# Patient Record
Sex: Female | Born: 1995 | Race: White | Hispanic: No | Marital: Married | Smoking: Current every day smoker
Health system: Southern US, Community
[De-identification: ages and names within clinical notes are randomized; demographics above are authoritative.]

## PROBLEM LIST (undated history)

## (undated) DIAGNOSIS — Q796 Ehlers-Danlos syndrome, unspecified: Secondary | ICD-10-CM

## (undated) DIAGNOSIS — I498 Other specified cardiac arrhythmias: Secondary | ICD-10-CM

## (undated) DIAGNOSIS — Z1589 Genetic susceptibility to other disease: Secondary | ICD-10-CM

## (undated) DIAGNOSIS — D649 Anemia, unspecified: Secondary | ICD-10-CM

## (undated) DIAGNOSIS — K219 Gastro-esophageal reflux disease without esophagitis: Secondary | ICD-10-CM

## (undated) DIAGNOSIS — A749 Chlamydial infection, unspecified: Secondary | ICD-10-CM

## (undated) DIAGNOSIS — G43109 Migraine with aura, not intractable, without status migrainosus: Secondary | ICD-10-CM

## (undated) DIAGNOSIS — F419 Anxiety disorder, unspecified: Secondary | ICD-10-CM

## (undated) DIAGNOSIS — F32A Depression, unspecified: Secondary | ICD-10-CM

## (undated) DIAGNOSIS — A080 Rotaviral enteritis: Secondary | ICD-10-CM

## (undated) DIAGNOSIS — R000 Tachycardia, unspecified: Secondary | ICD-10-CM

## (undated) DIAGNOSIS — G90A Postural orthostatic tachycardia syndrome (POTS): Secondary | ICD-10-CM

## (undated) DIAGNOSIS — M069 Rheumatoid arthritis, unspecified: Secondary | ICD-10-CM

## (undated) DIAGNOSIS — R55 Syncope and collapse: Secondary | ICD-10-CM

## (undated) DIAGNOSIS — N83209 Unspecified ovarian cyst, unspecified side: Secondary | ICD-10-CM

## (undated) DIAGNOSIS — K649 Unspecified hemorrhoids: Secondary | ICD-10-CM

## (undated) DIAGNOSIS — I951 Orthostatic hypotension: Secondary | ICD-10-CM

## (undated) DIAGNOSIS — Z8619 Personal history of other infectious and parasitic diseases: Secondary | ICD-10-CM

## (undated) DIAGNOSIS — F410 Panic disorder [episodic paroxysmal anxiety] without agoraphobia: Secondary | ICD-10-CM

## (undated) HISTORY — DX: Migraine with aura, not intractable, without status migrainosus: G43.109

## (undated) HISTORY — DX: Unspecified hemorrhoids: K64.9

## (undated) HISTORY — DX: Panic disorder (episodic paroxysmal anxiety): F41.0

## (undated) HISTORY — DX: Genetic susceptibility to other disease: Z15.89

## (undated) HISTORY — DX: Rheumatoid arthritis, unspecified: M06.9

## (undated) HISTORY — PX: OTHER SURGICAL HISTORY: SHX169

## (undated) HISTORY — DX: Unspecified ovarian cyst, unspecified side: N83.209

## (undated) HISTORY — DX: Anxiety disorder, unspecified: F41.9

## (undated) HISTORY — DX: Chlamydial infection, unspecified: A74.9

## (undated) HISTORY — DX: Personal history of other infectious and parasitic diseases: Z86.19

## (undated) HISTORY — DX: Syncope and collapse: R55

## (undated) HISTORY — DX: Ehlers-Danlos syndrome, unspecified: Q79.60

## (undated) HISTORY — DX: Rotaviral enteritis: A08.0

---
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
## 2015-01-14 ENCOUNTER — Other Ambulatory Visit: Payer: Self-pay

## 2015-01-14 ENCOUNTER — Encounter: Payer: Self-pay | Admitting: Cardiovascular Disease

## 2015-01-14 ENCOUNTER — Ambulatory Visit (INDEPENDENT_AMBULATORY_CARE_PROVIDER_SITE_OTHER): Payer: 59 | Admitting: Cardiovascular Disease

## 2015-01-14 VITALS — BP 98/50 | HR 98 | Ht 66.0 in | Wt 111.8 lb

## 2015-01-14 DIAGNOSIS — I471 Supraventricular tachycardia, unspecified: Secondary | ICD-10-CM

## 2015-01-14 DIAGNOSIS — R000 Tachycardia, unspecified: Secondary | ICD-10-CM | POA: Diagnosis not present

## 2015-01-14 HISTORY — DX: Supraventricular tachycardia, unspecified: I47.10

## 2015-01-14 MED ORDER — PROPRANOLOL HCL 10 MG PO TABS: 10.0000 mg | ORAL_TABLET | Freq: Four times a day (QID) | ORAL | Status: DC | PRN

## 2015-01-14 NOTE — Patient Instructions (Addendum)
Increase your intake of fluids (water with electrolyte tabs like Nun tablets, or gatorade) , protein ( hard boiled eggs, chicken, fish) , and a electrolytes ( V-8 juice, salt, potassium chloride  which is sold as No-Salt)   Try these the next time you have a fast HR episode  1. Valsalva maneuver 2. Stimulation of the diving reflex 3. Carotid sinus massage   If the episode lasts for a > 15 minutes , Try a propranolol 10 mg every 15 minutes ( up to a max of 4)  Medication Instructions:  Your physician has recommended you make the following change in your medication:  START taking propranolol 10mg  every 15 minutes up to maximum of 4 tablets as needed for tachycardia STOP taking Verapamil  Labwork: none  Testing/Procedures: Your physician has recommended that you wear an 30 day event monitor. Event monitors are medical devices that record the heart's electrical activity. Doctors most often [place] these monitors to diagnose arrhythmias. Arrhythmias are problems with the speed or rhythm of the heartbeat. The monitor is a small, portable device. You can wear one while you do your normal daily activities. This is usually used to diagnose what is causing palpitations/syncope (passing out).    Follow-Up: Your physician recommends that you schedule a follow-up appointment in: 4-6 weeks with Dr. Elease Hashimoto   Any Other Special Instructions Will Be Listed Below (If Applicable).  Cardiac Event Monitoring A cardiac event monitor is a small recording device used to help detect abnormal heart rhythms (arrhythmias). The monitor is used to record heart rhythm when noticeable symptoms such as the following occur:  Fast heartbeats (palpitations), such as heart racing or fluttering.  Dizziness.  Fainting or light-headedness.  Unexplained weakness. The monitor is wired to two electrodes placed on your chest. Electrodes are flat, sticky disks that attach to your skin. The monitor can be worn for up to 30 days.  You will wear the monitor at all times, except when bathing.  HOW TO USE YOUR CARDIAC EVENT MONITOR A technician will prepare your chest for the electrode placement. The technician will show you how to place the electrodes, how to work the monitor, and how to replace the batteries. Take time to practice using the monitor before you leave the office. Make sure you understand how to send the information from the monitor to your health care provider. This requires a telephone with a landline, not a cell phone. You need to:  Wear your monitor at all times, except when you are in water:  Do not get the monitor wet.  Take the monitor off when bathing. Do not swim or use a hot tub with it on.  Keep your skin clean. Do not put body lotion or moisturizer on your chest.  Change the electrodes daily or any time they stop sticking to your skin. You might need to use tape to keep them on.  It is possible that your skin under the electrodes could become irritated. To keep this from happening, try to put the electrodes in slightly different places on your chest. However, they must remain in the area under your left breast and in the upper right section of your chest.  Make sure the monitor is safely clipped to your clothing or in a location close to your body that your health care provider recommends.  Press the button to record when you feel symptoms of heart trouble, such as dizziness, weakness, light-headedness, palpitations, thumping, shortness of breath, unexplained weakness, or a fluttering or  racing heart. The monitor is always on and records what happened slightly before you pressed the button, so do not worry about being too late to get good information.  Keep a diary of your activities, such as walking, doing chores, and taking medicine. It is especially important to note what you were doing when you pushed the button to record your symptoms. This will help your health care provider determine what  might be contributing to your symptoms. The information stored in your monitor will be reviewed by your health care provider alongside your diary entries.  Send the recorded information as recommended by your health care provider. It is important to understand that it will take some time for your health care provider to process the results.  Change the batteries as recommended by your health care provider. SEEK IMMEDIATE MEDICAL CARE IF:   You have chest pain.  You have extreme difficulty breathing or shortness of breath.  You develop a very fast heartbeat that persists.  You develop dizziness that does not go away.  You faint or constantly feel you are about to faint. Document Released: 03/21/2008 Document Revised: 10/27/2013 Document Reviewed: 12/09/2012 [facility] Patient Information 2015 [place], [place]. This information is not intended to replace advice given to you by your health care provider. Make sure you discuss any questions you have with your health care provider.

## 2015-01-14 NOTE — Progress Notes (Signed)
Cardiology Office Note   Date:  01/14/2015   ID:  Monique Underwood, DOB 1995/09/27, MRN [id number]  PCP:  Crawford Givens, MD  Cardiologist:   Elease Hashimoto Deloris Ping, MD   Chief Complaint  Patient presents with  . other    C/o rapid heart beat and sob. Discuss verapamil makes pt feels like zombie. Meds reviewed verbally with pt.   Problem List :  1. Palpitations   History of Present Illness: Monique Underwood is a 19 y.o. female who presents for further evaluation of palpitations. She has these with rest or exertion.  Seem to be related with anxiety.  Associated with dyspnea.  Also feels weak and drowsy.  Has not had syncope. Was given verapamil but she does not take it because it makes feel poorly.   Feels like a zombie . Recently converted from depo - birth control and now is on monthly pills.  Does not know if her palps are associated with her periods.    Lasts for several minutes.   Probably less than 10 minutes Has passed out in the past but not necessarily related to a fast HR   Smokes 1/2 - 1 pack a day  She has trouble stopping smoking because of her anxiety.    BP typically runs low  Dietary: Does not eat breakfast Lunch is a snack ( crackers, PB &J, chicken nuggets) Dinner is typically a full meal   Is not working at present.  Is starting at [employer] soon.   Typically works in [employer].    Past Medical History  Diagnosis Date  . Rotavirus infection 1/25 - 07/22/02    Hospitalized  . Migraine with aura   . Syncope   . Anxiety   . Ovarian cyst     Past Surgical History  Procedure Laterality Date  . Febrile seizure  7/99    [facility] - w/u negative  . Mri - head      NML  . Eeg  7/99    NML     Current Outpatient Prescriptions  Medication Sig Dispense Refill  . acyclovir (ZOVIRAX) 400 MG tablet TAKE 1 TABLET (400 MG TOTAL) BY MOUTH 3 (THREE) TIMES DAILY. (Patient taking differently: TAKE 1 TABLET (400 MG TOTAL) BY MOUTH QD PRN.) 43 tablet 2  .  ibuprofen (ADVIL,MOTRIN) 800 MG tablet Take 1 tablet (800 mg total) by mouth 3 (three) times daily. (Patient taking differently: Take 800 mg by mouth daily as needed. ) 21 tablet 0  . norethindrone (ORTHO MICRONOR) 0.35 MG tablet Take 1 tablet (0.35 mg total) by mouth daily. 1 Package 11  . verapamil (CALAN) 40 MG tablet Take 1 tablet (40 mg total) by mouth 3 (three) times daily as needed (for heart racing). 30 tablet 1   No current facility-administered medications for this visit.    Allergies:   Shellfish allergy    Social History:  The patient  reports that she has been smoking Cigarettes.  She has a 1.5 pack-year smoking history. She has never used smokeless tobacco. She reports that she does not drink alcohol or use illicit drugs.   Family History:  The patient's family history includes Cancer in her other; Diabetes in her other; Thyroid disease in her mother.    ROS:  Please see the history of present illness.    Review of Systems: Constitutional:  denies fever, chills, diaphoresis, appetite change and fatigue.  HEENT: denies photophobia, eye pain, redness, hearing loss, ear  pain, congestion, sore throat, rhinorrhea, sneezing, neck pain, neck stiffness and tinnitus.  Respiratory: denies SOB, DOE, cough, chest tightness, and wheezing.  Cardiovascular: denies chest pain, palpitations and leg swelling.  Gastrointestinal: denies nausea, vomiting, abdominal pain, diarrhea, constipation, blood in stool.  Genitourinary: denies dysuria, urgency, frequency, hematuria, flank pain and difficulty urinating.  Musculoskeletal: denies  myalgias, back pain, joint swelling, arthralgias and gait problem.   Skin: denies pallor, rash and wound.  Neurological: denies dizziness, seizures, syncope, weakness, light-headedness, numbness and headaches.   Hematological: denies adenopathy, easy bruising, personal or family bleeding history.  Psychiatric/ Behavioral: denies suicidal ideation, mood changes,  confusion, nervousness, sleep disturbance and agitation.       All other systems are reviewed and negative.    PHYSICAL EXAM: VS:  BP 98/50 mmHg  Pulse 98  Ht 5\' 6"  (1.676 m)  Wt 50.689 kg (111 lb 12 oz)  BMI 18.05 kg/m2  LMP 12/09/2014 , BMI Body mass index is 18.05 kg/(m^2). GEN: Well nourished, well developed, in no acute distress HEENT: normal Neck: no JVD, carotid bruits, or masses Cardiac: RRR; no murmurs, rubs, or gallops,no edema  Respiratory:  clear to auscultation bilaterally, normal work of breathing GI: soft, nontender, nondistended, + BS MS: no deformity or atrophy Skin: warm and dry, no rash Neuro:  Strength and sensation are intact Psych: normal   EKG:  EKG is ordered today. The ekg ordered today demonstrates NSR at 98. Normal     Recent Labs: 12/16/2014: ALT 16; BUN 10; Creatinine, Ser 0.93; Hemoglobin 15.0; Platelets 260.0; Potassium 4.5; Sodium 139; TSH 0.82    Lipid Panel    Component Value Date/Time   CHOL 131 01/17/2013 1534   TRIG 106 01/17/2013 1534   HDL 43 01/17/2013 1534   CHOLHDL 3.0 01/17/2013 1534   VLDL 21 01/17/2013 1534   LDLCALC 67 01/17/2013 1534      Wt Readings from Last 3 Encounters:  01/14/15 50.689 kg (111 lb 12 oz) (20 %*, Z = -0.84)  12/16/14 49.102 kg (108 lb 4 oz) (14 %*, Z = -1.08)  12/10/14 50.689 kg (111 lb 12 oz) (20 %*, Z = -0.83)   * Growth percentiles are based on CDC 2-20 Years data.      Other studies Reviewed: Additional studies/ records that were reviewed today include: . Review of the above records demonstrates:    ASSESSMENT AND PLAN:   1. Palpitations:  I think that her symptoms are consistent with paroxysmal supraventricular tachycardia. She is not tolerating the verapamil well at all. We'll discontinue the verapamil and start her on propranolol 10 mg to take up to 4 times a day as needed for palpitations. Also instructed her in how to do the Valsalva maneuver and told her about stimulation of  the diving reflex. We also discussed carotid sinus massage although I told her that it was fairly difficult to do on yourself.  She's to take a propranolol tablet if the tachycardia does not resolve with a Valsalva maneuver. We'll place a [place] monitor on her for further evaluation. I'll see her back in the office in 4-6 weeks for follow-up visit.  I've recommended that she stay better hydrated. She's to drink plenty of water. I have advised her to increase her protein intake and also in her intake of electrolytes. I've recommended that she drink V8 juice every day. It's clear that she does not eat enough protein or regular food.    Current medicines are reviewed at length  with the patient today.  The patient does not have concerns regarding medicines.  The following changes have been made:  no change  Labs/ tests ordered today include:  Orders Placed This Encounter  Procedures  . EKG 12-Lead     Disposition:   FU with me in 4-6 weeks      Nahser, Deloris Ping, MD  01/14/2015 3:02 PM    [facility] [address] [place], [place], [place]  [address] Phone: [phone]; Fax: [phone]   [facility]  [address] [place], [place]  [address] [phone]   Fax [phone]

## 2015-01-16 ENCOUNTER — Ambulatory Visit (INDEPENDENT_AMBULATORY_CARE_PROVIDER_SITE_OTHER): Payer: 59

## 2015-01-16 DIAGNOSIS — R000 Tachycardia, unspecified: Secondary | ICD-10-CM

## 2015-01-26 ENCOUNTER — Telehealth: Payer: Self-pay

## 2015-01-26 NOTE — Telephone Encounter (Signed)
Pt saw Dr Elease Hashimoto recently and pt was told to contact PCP about anxiety and meds that could be an option. Pt said Dr Elease Hashimoto said he would be fine with any treatment Dr Para March would decide on for anxiety for pt. Pt request cb from Dr Para March. [facility].

## 2015-01-26 NOTE — Telephone Encounter (Signed)
See if she can come in to discuss.  Thanks.

## 2015-01-26 NOTE — Telephone Encounter (Signed)
Patient advised. Appointment scheduled.  

## 2015-01-27 ENCOUNTER — Telehealth: Payer: Self-pay | Admitting: Nurse Practitioner

## 2015-01-27 ENCOUNTER — Ambulatory Visit (INDEPENDENT_AMBULATORY_CARE_PROVIDER_SITE_OTHER): Payer: 59 | Admitting: Family Medicine

## 2015-01-27 ENCOUNTER — Encounter: Payer: Self-pay | Admitting: Family Medicine

## 2015-01-27 VITALS — BP 96/62 | HR 96 | Temp 98.5°F | Wt 113.2 lb

## 2015-01-27 DIAGNOSIS — F411 Generalized anxiety disorder: Secondary | ICD-10-CM

## 2015-01-27 DIAGNOSIS — I471 Supraventricular tachycardia: Secondary | ICD-10-CM

## 2015-01-27 MED ORDER — ESCITALOPRAM OXALATE 10 MG PO TABS
10.0000 mg | ORAL_TABLET | Freq: Every day | ORAL | Status: DC
Start: 2015-01-27 — End: 2015-06-15

## 2015-01-27 MED ORDER — ESCITALOPRAM OXALATE 10 MG PO TABS: 10.0000 mg | ORAL_TABLET | Freq: Every day | ORAL | Status: DC

## 2015-01-27 NOTE — Assessment & Plan Note (Signed)
Per cards.  App help.  She'll use prn BB if maneuvers don't help.

## 2015-01-27 NOTE — Progress Notes (Signed)
Pre visit review using our clinic review tool, if applicable. No additional management support is needed unless otherwise documented below in the visit note.  On monitor currently.  Possible SVT, using PRN BB.  She can feel some episodes of heart racing but not always.  No syncope recently.  She is working to get enough water and protein in with her diet.  She had an episode noted earlier today- noted on the monitor but she didn't feel sx.  She took a dose of BB and tolerated it- she was called by the RN monitoring her case.    She has noted anxiety.  Cards sx likely worse from anxiety, anxiety likely makes the SVT worse.  D/w pt.  She is now out of work, staying with her boyfriend and this has been relaxing for her.  D/w pt about options.  She has enough sx to need/want proph treatment.    Meds, vitals, and allergies reviewed.   ROS: See HPI.  Otherwise, noncontributory.  GEN: nad, alert and oriented HEENT: mucous membranes moist NECK: supple w/o LA CV: rrr.  Monitor in place PULM: ctab, no inc wob ABD: soft, +bs EXT: no edema SKIN: no acute rash Speech and affect wnl, judgement appears wnl

## 2015-01-27 NOTE — Telephone Encounter (Signed)
Called patient per Dr. Harvie Bridge request after receiving monitor report showing SVT.  She reports she did not feel this episode and denies complaints.  I reviewed with her the instructions given at her 7/21 office visit including the valsalva maneuvers and to take Propranolol 10 mg up to 4 times per day as needed.  I advised her to call the office if she has questions or concerns or feels that she needs to be seen sooner than 9/28 appointment.  She verbalized understanding and agreement.

## 2015-01-27 NOTE — Assessment & Plan Note (Signed)
Would try SSRI in her case, d/w pt about options, timeline, etc.  She agrees. Start lexapro 10mg  a day.  She'll update me.

## 2015-01-27 NOTE — Patient Instructions (Addendum)
Start taking the lexapro and update me as needed.  That should help.  Update me in about 2 weeks.  Take care.  Glad to see you.

## 2015-02-17 ENCOUNTER — Other Ambulatory Visit: Payer: Self-pay | Admitting: Cardiovascular Disease

## 2015-03-24 ENCOUNTER — Ambulatory Visit: Payer: 59 | Admitting: Cardiovascular Disease

## 2015-04-01 ENCOUNTER — Ambulatory Visit (INDEPENDENT_AMBULATORY_CARE_PROVIDER_SITE_OTHER): Payer: 59 | Admitting: Internal Medicine

## 2015-04-01 ENCOUNTER — Encounter (INDEPENDENT_AMBULATORY_CARE_PROVIDER_SITE_OTHER): Payer: Self-pay

## 2015-04-01 ENCOUNTER — Encounter: Payer: Self-pay | Admitting: Internal Medicine

## 2015-04-01 VITALS — BP 94/62 | HR 85 | Ht 67.0 in | Wt 115.8 lb

## 2015-04-01 DIAGNOSIS — I471 Supraventricular tachycardia: Secondary | ICD-10-CM

## 2015-04-01 DIAGNOSIS — I7789 Other specified disorders of arteries and arterioles: Secondary | ICD-10-CM | POA: Diagnosis not present

## 2015-04-01 DIAGNOSIS — I341 Nonrheumatic mitral (valve) prolapse: Secondary | ICD-10-CM | POA: Diagnosis not present

## 2015-04-01 NOTE — Progress Notes (Signed)
ELECTROPHYSIOLOGY CONSULT NOTE  Patient ID: Monique Underwood, MRN: [id number], DOB/AGE: 19/16/1997 19 y.o. 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     HPI Monique Underwood is a 19 y.o. female referred for  ECG was obtained initially after she presented to her primary care physician with a relatively low blood pressure. She has a history of exertional palpitations. Event recorder was obtained and demonstrated sinus rhythm. She has no history of abrupt onset or offset tachypalpitations.  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 She has not had problems with shower intolerance. Her menses have been controlled by birth control. Her diet is replete of sodium but deplete of fluid.   She has violaceous discoloration of her lower extremities upon prolonged standing.  Her brother also has arachnodactyly with a pectus deformity. There is no family history of sudden death.      Past Medical History  Diagnosis Date  . Rotavirus infection 1/25 - 07/22/02    Hospitalized  . Migraine with aura   . Syncope   . Anxiety   . Ovarian cyst       Surgical History:  Past Surgical History  Procedure Laterality Date  . Febrile seizure  7/99    [facility] - w/u negative  . Mri - head      NML  . Eeg  7/99    NML     Home Meds: Prior to Admission medications   Medication Sig Start Date End Date Taking? Authorizing Provider  acyclovir (ZOVIRAX) 400 MG tablet TAKE 1 TABLET (400 MG TOTAL) BY MOUTH 3 (THREE) TIMES DAILY. Patient taking differently: TAKE 1 TABLET (400 MG TOTAL) BY MOUTH QD PRN. 11/02/14  Yes Dianne Dun, MD  escitalopram (LEXAPRO) 10 MG tablet Take 1 tablet (10 mg total) by mouth daily. 01/27/15  Yes Joaquim Nam, MD  ibuprofen (ADVIL,MOTRIN) 800 MG tablet Take 1 tablet (800 mg total) by mouth 3 (three) times  daily. Patient taking differently: Take 800 mg by mouth daily as needed.  09/01/13  Yes Shari Upstill, PA-C  norethindrone (ORTHO MICRONOR) 0.35 MG tablet Take 1 tablet (0.35 mg total) by mouth daily. 12/10/14  Yes Joaquim Nam, MD  propranolol (INDERAL) 10 MG tablet TAKE 1 TABLET BY MOUTH EVERY 15 MINUTES UP TO 4 DOSES A DAY AS NEEEDED FOR PALPITATIONS 02/17/15  Yes Vesta Mixer, MD    Allergies:  Allergies  Allergen Reactions  . Shellfish Allergy Anaphylaxis and Hives    Social History   Social History  . Marital Status: Single    Spouse Name: N/A  . Number of Children: N/A  . Years of Education: N/A   Occupational History  . Not on file.   Social History Main Topics  . Smoking status: Current Every Day Smoker -- 0.50 packs/day for 3 years    Types: Cigarettes  . Smokeless tobacco: Never Used     Comment: Parents doesn't know she smokes!!!!!  . Alcohol Use: No  . Drug Use: No  . Sexual Activity: Not on file   Other Topics Concern  . Not on file   Social History Narrative   Lives at home with Mom, Dad and brother.     Family History  Problem Relation Age of Onset  . Thyroid disease Mother   . Diabetes Other   .  Cancer Other      ROS:  Please see the history of present illness.     All other systems reviewed and negative.    Physical Exam: Blood pressure 94/62, pulse 85, height 5\' 7"  (1.702 m), weight 115 lb 12 oz (52.504 kg). General: Well developed, well nourished female in no acute distress. Head: Normocephalic, atraumatic, sclera non-icteric, no xanthomas, nares are without discharge. EENT: normal  Lymph Nodes:  none Neck: Negative for carotid bruits. JVD not elevated. Back:without scoliosis kyphosi  Lungs: Clear bilaterally to auscultation without wheezes, rales, or rhonchi. Breathing is unlabored. Heart: RRR with S1 S2. No murmur . No rubs, or gallops appreciated. Abdomen: Soft, non-tender, non-distended with normoactive bowel sounds. No  hepatomegaly. No rebound/guarding. No obvious abdominal masses. Msk:  Strength and tone appear normal for age. Extremities: No clubbing or cyanosis. No  edema.  Distal pedal pulses are 2+ and equal bilaterally. Joint laxity positive thumb sign positive wrist sign arms and less than height Skin: Warm and Dry; violaceous discoloration of her feet  Neuro: Alert and oriented X 3. CN III-XII intact Grossly normal sensory and motor function . Psych:  Responds to questions appropriately with a normal affect.      Labs: Cardiac Enzymes No results for input(s): CKTOTAL, CKMB, TROPONINI in the last 72 hours. CBC Lab Results  Component Value Date   WBC 9.2 12/16/2014   HGB 15.0 12/16/2014   HCT 44.2 12/16/2014   MCV 90.9 12/16/2014   PLT 260.0 12/16/2014   PROTIME: No results for input(s): LABPROT, INR in the last 72 hours. Chemistry No results for input(s): NA, K, CL, CO2, BUN, CREATININE, CALCIUM, PROT, BILITOT, ALKPHOS, ALT, AST, GLUCOSE in the last 168 hours.  Invalid input(s): LABALBU Lipids Lab Results  Component Value Date   CHOL 131 01/17/2013   HDL 43 01/17/2013   LDLCALC 67 01/17/2013   TRIG 106 01/17/2013   BNP No results found for: PROBNP Thyroid Function Tests: No results for input(s): TSH, T4TOTAL, T3FREE, THYROIDAB in the last 72 hours.  Invalid input(s): FREET3 Miscellaneous No results found for: DDIMER  Radiology/Studies:  No results found.  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  We discussed extensively the issues of dysautonomia, the physiology of orthstasis and positional stress.  We discussed the role of salt and water repletion, the importance of exercise, often needing to be started in the recumbent  position, and the awareness of triggers and the role of ambient heat and dehydration.  I've also told her is important to avoid alcohol and marijuana  It is appropriate to undertake an echo to look for mitral valve prolapse which I do not hear and to look at the base of the aorta.   it is appropriate I think also to undertake imaging to exclude Marfan syndrome With the family history of a pectus deformity, undertaking a CAT scan also to look for the aortic root as well as dural ectasia seems reasonable. There is no history of lens dislocation.        Sherryl Manges

## 2015-04-01 NOTE — Patient Instructions (Addendum)
Medication Instructions:  Your physician recommends that you continue on your current medications as directed. Please refer to the Current Medication list given to you today.   Labwork: none  Testing/Procedures: Your physician has requested that you have an echocardiogram. Echocardiography is a painless test that uses sound waves to create images of your heart. It provides your doctor with information about the size and shape of your heart and how well your heart's chambers and valves are working. This procedure takes approximately one hour. There are no restrictions for this procedure.   Non-Cardiac CT Angiography (CTA), is a special type of CT scan that uses a computer to produce multi-dimensional views of major blood vessels throughout the body. In CT angiography, a contrast material is injected through an IV to help visualize the blood vessels  CT Spine and CT Angio Chest October 13, 1:00pm. Arrival time: 12:30pm    [facility] Liquids only 4 hours prior to exam. May take all medications as prescribed.   Follow-Up: Your physician recommends that you schedule a follow-up appointment in: three months with Dr Graciela Husbands   Any Other Special Instructions Will Be Listed Below (If Applicable).  Echocardiogram An echocardiogram, or echocardiography, uses sound waves (ultrasound) to produce an image of your heart. The echocardiogram is simple, painless, obtained within a short period of time, and offers valuable information to your health care provider. The images from an echocardiogram can provide information such as:  Evidence of coronary artery disease (CAD).  Heart size.  Heart muscle function.  Heart valve function.  Aneurysm detection.  Evidence of a past heart attack.  Fluid buildup around the heart.  Heart muscle thickening.  Assess heart valve function. LET [facility] CARE PROVIDER KNOW ABOUT:  Any allergies you have.  All medicines you are taking, including  vitamins, herbs, eye drops, creams, and over-the-counter medicines.  Previous problems you or members of your family have had with the use of anesthetics.  Any blood disorders you have.  Previous surgeries you have had.  Medical conditions you have.  Possibility of pregnancy, if this applies. BEFORE THE PROCEDURE  No special preparation is needed. Eat and drink normally.  PROCEDURE   In order to produce an image of your heart, gel will be applied to your chest and a wand-like tool (transducer) will be moved over your chest. The gel will help transmit the sound waves from the transducer. The sound waves will harmlessly bounce off your heart to allow the heart images to be captured in real-time motion. These images will then be recorded.  You may need an IV to receive a medicine that improves the quality of the pictures. AFTER THE PROCEDURE You may return to your normal schedule including diet, activities, and medicines, unless your health care provider tells you otherwise.   This information is not intended to replace advice given to you by your health care provider. Make sure you discuss any questions you have with your health care provider.   Document Released: 06/09/2000 Document Revised: 07/03/2014 Document Reviewed: 02/17/2013 Elsevier Interactive Patient Education [employer]

## 2015-04-06 ENCOUNTER — Telehealth: Payer: Self-pay | Admitting: Internal Medicine

## 2015-04-06 MED ORDER — PREDNISONE 50 MG PO TABS
ORAL_TABLET | ORAL | Status: DC
Start: 2015-04-06 — End: 2015-06-15

## 2015-04-06 NOTE — Telephone Encounter (Signed)
The patient is aware of her pre-medication instructions and verbalizes understanding. She will have her mother come with her to the CT on Thursday. RX for prednisone to [facility] [place].  As discussed with the patient: 1) Thursday 10/13 @ 12 am- Prednisone 50 mg x 1 dose 2) Thursday 10/13 @ 6 am- Prednisone 50 mg x 1 dose 3) Thursday 10/13 @ 12 pm- Prednisone 50 mg x 1 dose & Benadryl 50 mg x 1 dose  CT at 1:00 pm on 04/08/15

## 2015-04-06 NOTE — Telephone Encounter (Signed)
Ok per Dr. Graciela Husbands to pre-medicate with orders below.  I left a message for the patient to call me in [place] this afternoon, or if calling tomorrow, to please call the [place] office.

## 2015-04-06 NOTE — Telephone Encounter (Signed)
Call received from [place] in the CT dept at [facility]. She states that patient called today concerned with the contrast she is to receive with her scheduled CT scan on 04/08/15 (CTA chest). 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 The patient will need pre-medication for this if Dr. Graciela Husbands wishes for her to still receive contrast with the scan.  Per Lillia Abed, the pre-med protocol for their department is: Prednisone 50 mg x 1 dose at 13 hour/ 7 hours/ & 1 hour prior to her scan Benadryl 50 mg x 1 dose at 1 hour prior to her scan.  I advised I will review with Dr. Graciela Husbands and contact the patient.

## 2015-04-08 ENCOUNTER — Telehealth: Payer: Self-pay | Admitting: Internal Medicine

## 2015-04-08 ENCOUNTER — Other Ambulatory Visit: Payer: Self-pay | Admitting: Internal Medicine

## 2015-04-08 ENCOUNTER — Ambulatory Visit
Admission: RE | Admit: 2015-04-08 | Discharge: 2015-04-08 | Disposition: A | Discharge status: Home / Self Care | Payer: 59 | Source: Ambulatory Visit | Attending: Internal Medicine | Admitting: Internal Medicine

## 2015-04-08 ENCOUNTER — Other Ambulatory Visit: Payer: Self-pay

## 2015-04-08 ENCOUNTER — Encounter: Payer: Self-pay | Admitting: Family Medicine

## 2015-04-08 DIAGNOSIS — I7789 Other specified disorders of arteries and arterioles: Secondary | ICD-10-CM

## 2015-04-08 DIAGNOSIS — I471 Supraventricular tachycardia: Secondary | ICD-10-CM | POA: Insufficient documentation

## 2015-04-08 DIAGNOSIS — M5126 Other intervertebral disc displacement, lumbar region: Secondary | ICD-10-CM | POA: Diagnosis not present

## 2015-04-08 DIAGNOSIS — I341 Nonrheumatic mitral (valve) prolapse: Secondary | ICD-10-CM

## 2015-04-08 DIAGNOSIS — T508X5A Adverse effect of diagnostic agents, initial encounter: Secondary | ICD-10-CM

## 2015-04-08 LAB — HCG, QUANTITATIVE, PREGNANCY: hCG, Beta Chain, Quant, S: <1 m[IU]/mL (ref <5)

## 2015-04-08 MED ORDER — IOHEXOL 350 MG/ML SOLN
75.0000 mL | Freq: Once | INTRAVENOUS | Status: AC | PRN
  Administered 2015-04-08: 75 mL via INTRAVENOUS

## 2015-04-08 NOTE — Telephone Encounter (Signed)
I did not see this phone call until now as I have been in clinic. Ben called back and did not ask about pre-med orders, but needs a physical signature on the order for the patient's CT scans to be done this afternoon. I advised him it would be just a few minutes (MD in a patient room) and I will send the order. Fax #- [phone].

## 2015-04-08 NOTE — Telephone Encounter (Signed)
New message      Pt is coming in for CT today.  She is allergic to the dye.  Please call and let him know what needs to be done prior to CT.

## 2015-04-23 ENCOUNTER — Ambulatory Visit (INDEPENDENT_AMBULATORY_CARE_PROVIDER_SITE_OTHER): Payer: 59

## 2015-04-23 ENCOUNTER — Other Ambulatory Visit: Payer: Self-pay

## 2015-04-23 DIAGNOSIS — I341 Nonrheumatic mitral (valve) prolapse: Secondary | ICD-10-CM

## 2015-05-19 ENCOUNTER — Ambulatory Visit: Payer: 59 | Admitting: Cardiovascular Disease

## 2015-06-15 ENCOUNTER — Ambulatory Visit (INDEPENDENT_AMBULATORY_CARE_PROVIDER_SITE_OTHER): Payer: 59 | Admitting: Family Medicine

## 2015-06-15 ENCOUNTER — Encounter: Payer: Self-pay | Admitting: Family Medicine

## 2015-06-15 VITALS — BP 88/50 | HR 68 | Temp 98.6°F | Wt 114.2 lb

## 2015-06-15 DIAGNOSIS — R000 Tachycardia, unspecified: Secondary | ICD-10-CM

## 2015-06-15 DIAGNOSIS — F411 Generalized anxiety disorder: Secondary | ICD-10-CM

## 2015-06-15 MED ORDER — IBUPROFEN 800 MG PO TABS: 800.0000 mg | ORAL_TABLET | Freq: Every day | ORAL | Status: DC | PRN

## 2015-06-15 MED ORDER — VENLAFAXINE HCL 37.5 MG PO TABS: 37.5000 mg | ORAL_TABLET | Freq: Two times a day (BID) | ORAL | Status: DC

## 2015-06-15 MED ORDER — ACYCLOVIR 400 MG PO TABS: ORAL_TABLET | ORAL | Status: DC

## 2015-06-15 NOTE — Progress Notes (Signed)
Pre visit review using our clinic review tool, if applicable. No additional management support is needed unless otherwise documented below in the visit note.  Dysautonomia.  PRN BB use.  She does have some inc in heart rate when waking in the AM but usually doesn't need BB then.  She does vagal maneuvers prn.  D/w pt about dysautonomia dx and tx, with salt and fluids.    Mood discussion.  She tried lexapro for about 1 month.  She had a lot of GI upset with lexapro, persistent.  Stopped med ~02/2015, and GI sx resolved.  Anxiety is present.  She is trying to compensate for home and work stressors.  No SI/HI.  She feels overwhelmed.   She gets "upset over little stuffy, more easily annoyed."  She has had some panic sx, occ.  She'll have daily sx. Some days are worse than others.  She isn't depressed.  "I love my life and my job."    Meds, vitals, and allergies reviewed.   ROS: See HPI.  Otherwise, noncontributory.  nad Tearful but regains composure.   rrr ctab abd soft Speech wnl, judgement wnl

## 2015-06-15 NOTE — Patient Instructions (Signed)
Try taking venlafaxine daily for about 10 days.  If tolerated, then take it twice a day and then update me.  I think that would help the anxiety.  Take care.  Glad to see you.

## 2015-06-16 NOTE — Assessment & Plan Note (Signed)
Failed SSRI, intolerant.  Reasonable to try SNRI, with the possibility of needing BZD added on.  D/w pt. Wouldn't start only BZD due to likely eventual failure and need for daily tx.  D/w pt.  Still okay for outpatient f/u.  She agrees with plan. She'll update me.  Will start venlafaxine 37.5mg  qd and if tolerated then inc to BID dosing.  >25 minutes spent in face to face time with patient, >50% spent in counselling or coordination of care

## 2015-06-16 NOTE — Assessment & Plan Note (Signed)
Continue PRN BB use. She does have some inc in heart rate when waking in the AM but usually doesn't need BB then. She does vagal maneuvers prn. D/w pt about dysautonomia dx and tx, with salt and fluids.   She agrees.

## 2015-07-15 ENCOUNTER — Ambulatory Visit (INDEPENDENT_AMBULATORY_CARE_PROVIDER_SITE_OTHER): Payer: 59 | Admitting: Internal Medicine

## 2015-07-15 ENCOUNTER — Encounter: Payer: Self-pay | Admitting: Internal Medicine

## 2015-07-15 VITALS — BP 103/71 | HR 85 | Ht 67.0 in | Wt 112.0 lb

## 2015-07-15 DIAGNOSIS — R000 Tachycardia, unspecified: Secondary | ICD-10-CM | POA: Diagnosis not present

## 2015-07-15 NOTE — Patient Instructions (Signed)
Medication Instructions: - no changes  Labwork: - none  Procedures/Testing: - none  Follow-Up: - Your physician wants you to follow-up in: 6 months with Dr. Graciela Husbands. You will receive a reminder letter in the mail two months in advance. If you don't receive a letter, please call our office to schedule the follow-up appointment.  Any Additional Special Instructions Will Be Listed Below (If Applicable). - Increase the head of the bed (about 4-6 inches) - Please drink fluids in the the morning - Shower at night - Please wear an abdominal binder    If you need a refill on your cardiac medications before your next appointment, please call your pharmacy.

## 2015-07-15 NOTE — Progress Notes (Signed)
      Patient Care Team: Joaquim Nam, MD as PCP - General (Family Medicine)   HPI  Monique Underwood is a 20 y.o. female Seen in followup for exertional palps consistent with sinus rhythm with other findings consistent with autonomic dysfunction incl hypotension, violaceous discoloration of lower extremites syncope  She was also noted to have joint laxity and arachnodactyly  Echo showed normal LV function and structure CT neg  She's had no interval  syncope. She has had some dizziness. Her symptoms are worse in the morning. She showers in the morning this can aggravate them. She is not exercising. Past Medical History  Diagnosis Date  . Rotavirus infection 1/25 - 07/22/02    Hospitalized  . Migraine with aura   . Syncope   . Anxiety   . Ovarian cyst     Past Surgical History  Procedure Laterality Date  . Febrile seizure  7/99    [facility] - w/u negative  . Mri - head      NML  . Eeg  7/99    NML    Current Outpatient Prescriptions  Medication Sig Dispense Refill  . acyclovir (ZOVIRAX) 400 MG tablet TAKE 1 TABLET (400 MG TOTAL) BY MOUTH QD PRN.    Marland Kitchen ibuprofen (ADVIL,MOTRIN) 800 MG tablet Take 1 tablet (800 mg total) by mouth daily as needed.    . propranolol (INDERAL) 10 MG tablet TAKE 1 TABLET BY MOUTH EVERY 15 MINUTES UP TO 4 DOSES A DAY AS NEEEDED FOR PALPITATIONS 120 tablet 3  . venlafaxine (EFFEXOR) 37.5 MG tablet Take 1 tablet (37.5 mg total) by mouth 2 (two) times daily. 60 tablet 1   No current facility-administered medications for this visit.    Allergies  Allergen Reactions  . Shellfish Allergy Anaphylaxis and Hives  . Lexapro [Escitalopram Oxalate] Other (See Comments)    GI upset.       Review of Systems negative except from HPI and PMH  Physical Exam BP 103/71 mmHg  Pulse 85  Ht  (1.702 m)  Wt 112 lb (50.803 kg)  BMI 17.54 kg/m2 Well developed and well nourished in no acute distress HENT normal E scleral and icterus clear Neck  Supple JVP flat; carotids brisk and full Clear to ausculation  Regular rate and rhythm, no murmurs gallops or rub Soft with active bowel sounds No clubbing cyanosis  Edema Alert and oriented, grossly normal motor and sensory function Skin Warm and Dry  ECG demonstrates sinus rhythm at 85 Intervals 06/01/36  Assessment and  Plan  1 dysautonomia  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   We discussed the value of showering at night instead of the morning, raising the head of the bed 4-6 inches to address her major symptoms in the morning, increased hydration the morning. I mentioned that an abdominal binder may be of benefit at her work place where she is not allowed to sit down. We also discussed the potential contribution of alcohol and marijuana. She does not use the latter.

## 2015-09-21 ENCOUNTER — Telehealth: Payer: Self-pay | Admitting: Internal Medicine

## 2015-09-21 NOTE — Telephone Encounter (Signed)
Pt calling stating she is having some tightness in her chest Since she woke up No symptoms  Just can't take a deep breath. Feels like someone is "squeezing" her lungs. Is at work. Not sure if she needs to leave or not Please advise.  ?

## 2015-09-21 NOTE — Telephone Encounter (Signed)
I called and spoke with the patient. She reports having symptoms of chest tightness every day when she wakes up, but this will typically resolve fairly quickly. She woke up at 7 am this morning with chest tightness/ trouble deep breathing, that still has not resolved. She feels like someone is "squeezing" her lungs.  She states she does not feel tachycardic. She only drinks about 2 bottles of water/ day. She works at [employer] and stands up all day. She is not wearing her abdominal binder.  I advised her she may potentially be somewhat dehydrated with the warmer weather and encouraged her to drink gatorade/ powerade to see if this will help. I also advised I would be in touch with Dr. Graciela Husbands for further recommendations.   Reviewed with Dr. Graciela Husbands- he questions the possibility of acid reflux or ? PE if she is on oral contraceptives.  He recommends OTC PPI for possible reflux and evaluation by PCP or Urgent Care if she is concerned about her symptoms.  He does recommend hydration as well.  I have relayed MD recommendations to the patient. She states she she has had reflux in the past, and this feels different.  She has been off oral contraceptives for about a year. I have advised her to call her PCP for further recommendations and possible evaluation/ Urgent Care. Hydration has also been encouraged. The patient verbalizes understanding.

## 2015-10-22 ENCOUNTER — Telehealth: Payer: Self-pay

## 2015-10-22 NOTE — Telephone Encounter (Signed)
Pt has vaginal discharge and perineal itching; pt has not taken abx. Pt is taking probiotics so she has not taken OTC med for vaginitis. Offered [facility] app[facility] at [facility] but pt said her mother [facility]. Pt will cb if needed. FYI to Dr Para March.

## 2015-10-24 NOTE — Telephone Encounter (Signed)
Noted. Thanks.

## 2015-12-02 ENCOUNTER — Ambulatory Visit (INDEPENDENT_AMBULATORY_CARE_PROVIDER_SITE_OTHER): Payer: 59 | Admitting: Internal Medicine

## 2015-12-02 ENCOUNTER — Encounter: Payer: Self-pay | Admitting: Internal Medicine

## 2015-12-02 VITALS — BP 102/68 | HR 80 | Temp 99.0°F | Wt 113.5 lb

## 2015-12-02 DIAGNOSIS — S161XXA Strain of muscle, fascia and tendon at neck level, initial encounter: Secondary | ICD-10-CM | POA: Diagnosis not present

## 2015-12-02 DIAGNOSIS — S46812A Strain of other muscles, fascia and tendons at shoulder and upper arm level, left arm, initial encounter: Secondary | ICD-10-CM

## 2015-12-02 MED ORDER — IBUPROFEN 600 MG PO TABS: 600.0000 mg | ORAL_TABLET | Freq: Three times a day (TID) | ORAL | Status: DC | PRN

## 2015-12-02 MED ORDER — METHOCARBAMOL 500 MG PO TABS: 500.0000 mg | ORAL_TABLET | Freq: Every evening | ORAL | Status: DC | PRN

## 2015-12-02 NOTE — Progress Notes (Signed)
Pre visit review using our clinic review tool, if applicable. No additional management support is needed unless otherwise documented below in the visit note. 

## 2015-12-02 NOTE — Patient Instructions (Signed)

## 2015-12-02 NOTE — Progress Notes (Signed)
Subjective:    Patient ID: Monique Underwood, female    DOB: March 26, 1996, 20 y.o.   MRN: [id number]  HPI  Pt presents to the clinic today with c/o shoulder and neck pain. This started 5 days ago. She denies any injury to the area but reports she woke up with this pain. She describes the pain as sore and stiff. She intermittently has sharp pain between her shoulder blades, that radiates down her left arm. She denies numbness and tingling in her arm. She has tried Ibuprofen 800 mg and ice with minimal relief.  Review of Systems      Past Medical History  Diagnosis Date  . Rotavirus infection 1/25 - 07/22/02    Hospitalized  . Migraine with aura   . Syncope   . Anxiety   . Ovarian cyst     Current Outpatient Prescriptions  Medication Sig Dispense Refill  . acyclovir (ZOVIRAX) 400 MG tablet TAKE 1 TABLET (400 MG TOTAL) BY MOUTH QD PRN.    Marland Kitchen ibuprofen (ADVIL,MOTRIN) 800 MG tablet Take 1 tablet (800 mg total) by mouth daily as needed.    . propranolol (INDERAL) 10 MG tablet TAKE 1 TABLET BY MOUTH EVERY 15 MINUTES UP TO 4 DOSES A DAY AS NEEEDED FOR PALPITATIONS 120 tablet 3  . venlafaxine (EFFEXOR) 37.5 MG tablet Take 1 tablet (37.5 mg total) by mouth 2 (two) times daily. 60 tablet 1   No current facility-administered medications for this visit.    Allergies  Allergen Reactions  . Shellfish Allergy Anaphylaxis and Hives  . Lexapro [Escitalopram Oxalate] Other (See Comments)    GI upset.     Family History  Problem Relation Age of Onset  . Thyroid disease Mother   . Diabetes Other   . Cancer Other     Social History   Social History  . Marital Status: Single    Spouse Name: N/A  . Number of Children: N/A  . Years of Education: N/A   Occupational History  . Not on file.   Social History Main Topics  . Smoking status: Current Every Day Smoker -- 0.50 packs/day for 3 years    Types: Cigarettes  . Smokeless tobacco: Never Used     Comment: Parents doesn't know she  smokes!!!!!  . Alcohol Use: No  . Drug Use: No  . Sexual Activity: Not on file   Other Topics Concern  . Not on file   Social History Narrative   Lives at home with Mom, Dad and brother.     Constitutional: Denies fever, malaise, fatigue, headache or abrupt weight changes.  Musculoskeletal: Pt reports neck and shoulder pain. Denies decrease in range of motion, difficulty with gait, or joint swelling.  Neurological: Denies dizziness, difficulty with memory, difficulty with speech or problems with balance and coordination.  .  No other specific complaints in a complete review of systems (except as listed in HPI above).  Objective:   Physical Exam   BP 102/68 mmHg  Pulse 80  Temp(Src) 99 F (37.2 C) (Oral)  Wt 113 lb 8 oz (51.483 kg)  LMP 12/02/2015 Wt Readings from Last 3 Encounters:  12/02/15 113 lb 8 oz (51.483 kg) (21 %*, Z = -0.81)  07/15/15 112 lb (50.803 kg) (19 %*, Z = -0.87)  06/15/15 114 lb 4 oz (51.823 kg) (24 %*, Z = -0.72)   * Growth percentiles are based on CDC 2-20 Years data.    General: Appears her stated age, well developed, well  nourished in NAD. Musculoskeletal: Decreased flexion and rotation to the right of the cervical spine. Normal extension and rotation to the left. No bony tenderness noted. Pain with palpation of the bilateral paracervical and trapezius muscles. Normal internal and external rotation of the left shoulder. Negative drop can test.  Neurological: Alert and oriented. Sensation intact to BUE.   BMET    Component Value Date/Time   NA 139 12/16/2014 1907   K 4.5 12/16/2014 1907   CL 108 12/16/2014 1907   CO2 24 12/16/2014 1907   GLUCOSE 79 12/16/2014 1907   BUN 10 12/16/2014 1907   CREATININE 0.93 12/16/2014 1907   CREATININE 0.99 01/17/2013 1534   CALCIUM 10.1 12/16/2014 1907    Lipid Panel     Component Value Date/Time   CHOL 131 01/17/2013 1534   TRIG 106 01/17/2013 1534   HDL 43 01/17/2013 1534   CHOLHDL 3.0 01/17/2013  1534   VLDL 21 01/17/2013 1534   LDLCALC 67 01/17/2013 1534    CBC    Component Value Date/Time   WBC 9.2 12/16/2014 1907   RBC 4.86 12/16/2014 1907   HGB 15.0 12/16/2014 1907   HCT 44.2 12/16/2014 1907   PLT 260.0 12/16/2014 1907   MCV 90.9 12/16/2014 1907   MCHC 34.0 12/16/2014 1907   RDW 13.3 12/16/2014 1907   LYMPHSABS 3.3 12/16/2014 1907   MONOABS 0.7 12/16/2014 1907   EOSABS 0.1 12/16/2014 1907   BASOSABS 0.1 12/16/2014 1907    Hgb A1C No results found for: HGBA1C      Assessment & Plan:   Muscle strain of neck and trapezius:  Continue Ibuprofen 600 mgTID prn, eRx sent eRx for Robaxin 500 mg QHS prn, 0 refills Try heat instead of ice Stretching exercises given  RTC as needed or if symptoms persist or worsen

## 2015-12-22 ENCOUNTER — Ambulatory Visit: Payer: 59 | Admitting: Family Medicine

## 2015-12-22 ENCOUNTER — Ambulatory Visit (INDEPENDENT_AMBULATORY_CARE_PROVIDER_SITE_OTHER): Payer: 59 | Admitting: Obstetrics and Gynecology

## 2015-12-22 ENCOUNTER — Encounter: Payer: Self-pay | Admitting: Obstetrics and Gynecology

## 2015-12-22 ENCOUNTER — Encounter: Payer: Self-pay | Admitting: Family Medicine

## 2015-12-22 ENCOUNTER — Ambulatory Visit (INDEPENDENT_AMBULATORY_CARE_PROVIDER_SITE_OTHER): Payer: 59 | Admitting: Family Medicine

## 2015-12-22 VITALS — BP 102/64 | HR 89 | Ht 67.0 in | Wt 117.9 lb

## 2015-12-22 VITALS — BP 92/60 | HR 84 | Temp 97.9°F | Ht 67.0 in | Wt 114.5 lb

## 2015-12-22 DIAGNOSIS — Z3202 Encounter for pregnancy test, result negative: Secondary | ICD-10-CM | POA: Diagnosis not present

## 2015-12-22 DIAGNOSIS — F526 Dyspareunia not due to a substance or known physiological condition: Secondary | ICD-10-CM | POA: Diagnosis not present

## 2015-12-22 DIAGNOSIS — Z113 Encounter for screening for infections with a predominantly sexual mode of transmission: Secondary | ICD-10-CM

## 2015-12-22 DIAGNOSIS — N898 Other specified noninflammatory disorders of vagina: Secondary | ICD-10-CM | POA: Diagnosis not present

## 2015-12-22 DIAGNOSIS — R000 Tachycardia, unspecified: Secondary | ICD-10-CM

## 2015-12-22 DIAGNOSIS — IMO0001 Reserved for inherently not codable concepts without codable children: Secondary | ICD-10-CM

## 2015-12-22 DIAGNOSIS — Z01419 Encounter for gynecological examination (general) (routine) without abnormal findings: Secondary | ICD-10-CM

## 2015-12-22 DIAGNOSIS — B379 Candidiasis, unspecified: Secondary | ICD-10-CM

## 2015-12-22 DIAGNOSIS — Z309 Encounter for contraceptive management, unspecified: Secondary | ICD-10-CM | POA: Diagnosis not present

## 2015-12-22 DIAGNOSIS — F411 Generalized anxiety disorder: Secondary | ICD-10-CM

## 2015-12-22 DIAGNOSIS — Z8742 Personal history of other diseases of the female genital tract: Secondary | ICD-10-CM | POA: Diagnosis not present

## 2015-12-22 DIAGNOSIS — R1111 Vomiting without nausea: Secondary | ICD-10-CM | POA: Diagnosis not present

## 2015-12-22 LAB — POCT URINE PREGNANCY: PREG TEST UR: NEGATIVE

## 2015-12-22 MED ORDER — HYDROXYZINE HCL 10 MG PO TABS: 5.0000 mg | ORAL_TABLET | Freq: Three times a day (TID) | ORAL | Status: DC | PRN

## 2015-12-22 NOTE — Progress Notes (Signed)
GYNECOLOGY ANNUAL PHYSICAL EXAM PROGRESS NOTE  Subjective:    Monique Underwood is a 20 y.o. G0P0 female who presents for an annual exam. The patient is sexually active (1 partner x 4 years).  The patient wears seatbelts: yes. The patient participates in regular exercise: no. Has the patient ever been transfused or tattooed?: no. The patient reports that there is not domestic violence in her life.    The patient has the following complaints today:  1) Thinks she could be pregnant.  Has had intermittent nausea/vomiting, and breast tenderness.  2) Vaginal discharge x 1 week, foul smelling. Took a probiotic which helped.  Desires STI testing.  3) Notes dyspareunia and feels like boyfriend is "hitting something" during intercourse.  Has h/o ovarian cyst.   Gynecologic History Patient's last menstrual period was 12/02/2015. Menarche age: 45 Contraception: none. Using withdrawal method.  History of STI's: Denies Last Pap: No previous GYN history.     OB History  Gravida Para Term Preterm AB SAB TAB Ectopic Multiple Living  0 0 0 0 0 0 0 0 0 0         Past Medical History  Diagnosis Date  . Rotavirus infection 1/25 - 07/22/02    Hospitalized  . Migraine with aura   . Syncope   . Anxiety   . Ovarian cyst   . EDS (Ehlers-Danlos syndrome)     EDS 3    Past Surgical History  Procedure Laterality Date  . Febrile seizure  7/99    [facility] - w/u negative  . Mri - head      NML  . Eeg  7/99    NML    Family History  Problem Relation Age of Onset  . Thyroid disease Mother   . Diabetes Other   . Cancer Other     Social History   Social History  . Marital Status: Single    Spouse Name: N/A  . Number of Children: N/A  . Years of Education: N/A   Occupational History  . Not on file.   Social History Main Topics  . Smoking status: Current Every Day Smoker -- 0.50 packs/day for 3 years    Types: Cigarettes  . Smokeless tobacco: Never Used     Comment: Parents  doesn't know she smokes!!!!!  . Alcohol Use: No  . Drug Use: No  . Sexual Activity: Yes    Birth Control/ Protection: None   Other Topics Concern  . Not on file   Social History Narrative   Lives at home with Mom, Dad and brother.    Current Outpatient Prescriptions on File Prior to Visit  Medication Sig Dispense Refill  . acyclovir (ZOVIRAX) 400 MG tablet TAKE 1 TABLET (400 MG TOTAL) BY MOUTH QD PRN.    Marland Kitchen propranolol (INDERAL) 10 MG tablet TAKE 1 TABLET BY MOUTH EVERY 15 MINUTES UP TO 4 DOSES A DAY AS NEEEDED FOR PALPITATIONS 120 tablet 3  . venlafaxine (EFFEXOR) 37.5 MG tablet Take 1 tablet (37.5 mg total) by mouth 2 (two) times daily. (Patient not taking: Reported on 12/02/2015) 60 tablet 1   No current facility-administered medications on file prior to visit.    Allergies  Allergen Reactions  . Shellfish Allergy Anaphylaxis and Hives  . Lexapro [Escitalopram Oxalate] Other (See Comments)    GI upset.     Review of Systems Constitutional: negative for chills, fatigue, fevers and sweats Eyes: negative for irritation, redness and visual disturbance Ears, nose,  mouth, throat, and face: negative for hearing loss, nasal congestion, snoring and tinnitus Respiratory: negative for asthma, cough, sputum Cardiovascular: negative for chest pain, dyspnea, exertional chest pressure/discomfort, irregular heart beat, palpitations and syncope Gastrointestinal: Positive for nausea.  Negative for abdominal pain, change in bowel habits, and vomiting Genitourinary: negative for abnormal menstrual periods, genital lesions, sexual problems and vaginal discharge, dysuria and urinary incontinence Integument/breast: Positive for breast tenderness. Negative for breast lump and nipple discharge Hematologic/lymphatic: negative for bleeding and easy bruising Musculoskeletal:negative for back pain and muscle weakness Neurological: negative for dizziness, headaches, vertigo and weakness Endocrine:  negative for diabetic symptoms including polydipsia, polyuria and skin dryness Allergic/Immunologic: negative for hay fever and urticaria      Objective:  Blood pressure 102/64, pulse 89, height 5\' 7"  (1.702 m), weight 117 lb 14.4 oz (53.479 kg), last menstrual period 12/02/2015. Body mass index is 18.46 kg/(m^2).   General Appearance:    Alert, cooperative, no distress, appears stated age  Head:    Normocephalic, without obvious abnormality, atraumatic  Eyes:    PERRL, conjunctiva/corneas clear, EOM's intact, both eyes  Ears:    Normal external ear canals, both ears  Nose:   Nares normal, septum midline, mucosa normal, no drainage or sinus tenderness  Throat:   Lips, mucosa, and tongue normal; teeth and gums normal  Neck:   Supple, symmetrical, trachea midline, no adenopathy; thyroid: no enlargement/tenderness/nodules; no carotid bruit or JVD  Back:     Symmetric, no curvature, ROM normal, no CVA tenderness  Lungs:     Clear to auscultation bilaterally, respirations unlabored  Chest Wall:    No tenderness or deformity   Heart:    Regular rate and rhythm, S1 and S2 normal, no murmur, rub or gallop  Breast Exam:    No tenderness, masses, or nipple abnormality  Abdomen:     Soft, non-tender, bowel sounds active all four quadrants, no masses, no organomegaly.    Genitalia:    Pelvic:external genitalia normal, vagina without lesions or tenderness.  Small amount of thin white discharge present, no odor. Nectovaginal septum  normal. Cervix normal in appearance, no cervical motion tenderness, no adnexal masses palpable but mild tenderness noted in left adnexa. Uterus normal size, shape, mobile, regular contours, nontender.  Rectal:    Normal external sphincter.  No hemorrhoids appreciated. Internal exam not done.   Extremities:   Extremities normal, atraumatic, no cyanosis or edema  Pulses:   2+ and symmetric all extremities  Skin:   Skin color, texture, turgor normal, no rashes or lesions  Lymph  nodes:   Cervical, supraclavicular, and axillary nodes normal  Neurologic:   CNII-XII intact, normal strength, sensation and reflexes throughout   .  Labs:  Lab Results  Component Value Date   WBC 9.2 12/16/2014   HGB 15.0 12/16/2014   HCT 44.2 12/16/2014   MCV 90.9 12/16/2014   PLT 260.0 12/16/2014    Lab Results  Component Value Date   CREATININE 0.93 12/16/2014   BUN 10 12/16/2014   NA 139 12/16/2014   K 4.5 12/16/2014   CL 108 12/16/2014   CO2 24 12/16/2014    Lab Results  Component Value Date   ALT 16 12/16/2014   AST 20 12/16/2014   ALKPHOS 64 12/16/2014   BILITOT 0.8 12/16/2014    Lab Results  Component Value Date   TSH 0.82 12/16/2014     Assessment:   Healthy female exam.   Encounter for pregnancy test Vaginal discharge Dyspareunia H/o  ovarian cyst   Plan:    Blood tests: Basic metabolic panel and CBC with diff. Breast self exam technique reviewed and patient encouraged to perform self-exam monthly. Contraception: none.  Is using withdrawal method.  Declines other methods.  Discussed healthy lifestyle modifications. Pregnancy test, result: negative. Advised that since patient has not yet had missed menses, to monitor for absence of menses and retest at home if necessary.  Up to date with HPV vaccination.  Patient desires STI testing.  Will order. Nuswab collected.  Dyspareunia, mostly noted on left side, prior h/o ovarian cyst on that side. Patient thinks she may be developing another cyst.  Will order pelvic sono to assess.  Follow up in 1 year.    Hildred Laser, MD [facility]

## 2015-12-22 NOTE — Patient Instructions (Signed)
Health Maintenance, Female Adopting a healthy lifestyle and getting preventive care can go a long way to promote health and wellness. Talk with your health care provider about what schedule of regular examinations is right for you. This is a good chance for you to check in with your provider about disease prevention and staying healthy. In between checkups, there are plenty of things you can do on your own. Experts have done a lot of research about which lifestyle changes and preventive measures are most likely to keep you healthy. Ask your health care provider for more information. WEIGHT AND DIET  Eat a healthy diet  Be sure to include plenty of vegetables, fruits, low-fat dairy products, and lean protein.  Do not eat a lot of foods high in solid fats, added sugars, or salt.  Get regular exercise. This is one of the most important things you can do for your health.  Most adults should exercise for at least 150 minutes each week. The exercise should increase your heart rate and make you sweat (moderate-intensity exercise).  Most adults should also do strengthening exercises at least twice a week. This is in addition to the moderate-intensity exercise.  Maintain a healthy weight  Body mass index (BMI) is a measurement that can be used to identify possible weight problems. It estimates body fat based on height and weight. Your health care provider can help determine your BMI and help you achieve or maintain a healthy weight.  For females 20 years of age and older:   A BMI below 18.5 is considered underweight.  A BMI of 18.5 to 24.9 is normal.  A BMI of 25 to 29.9 is considered overweight.  A BMI of 30 and above is considered obese.  Watch levels of cholesterol and blood lipids  You should start having your blood tested for lipids and cholesterol at 20 years of age, then have this test every 5 years.  You may need to have your cholesterol levels checked more often if:  Your lipid  or cholesterol levels are high.  You are older than 20 years of age.  You are at high risk for heart disease.  CANCER SCREENING   Lung Cancer  Lung cancer screening is recommended for adults 55-80 years old who are at high risk for lung cancer because of a history of smoking.  A yearly low-dose CT scan of the lungs is recommended for people who:  Currently smoke.  Have quit within the past 15 years.  Have at least a 30-pack-year history of smoking. A pack year is smoking an average of one pack of cigarettes a day for 1 year.  Yearly screening should continue until it has been 15 years since you quit.  Yearly screening should stop if you develop a health problem that would prevent you from having lung cancer treatment.  Breast Cancer  Practice breast self-awareness. This means understanding how your breasts normally appear and feel.  It also means doing regular breast self-exams. Let your health care provider know about any changes, no matter how small.  If you are in your 20s or 30s, you should have a clinical breast exam (CBE) by a health care provider every 1-3 years as part of a regular health exam.  If you are 40 or older, have a CBE every year. Also consider having a breast X-ray (mammogram) every year.  If you have a family history of breast cancer, talk to your health care provider about genetic screening.  If you   are at high risk for breast cancer, talk to your health care provider about having an MRI and a mammogram every year.  Breast cancer gene (BRCA) assessment is recommended for women who have family members with BRCA-related cancers. BRCA-related cancers include:  Breast.  Ovarian.  Tubal.  Peritoneal cancers.  Results of the assessment will determine the need for genetic counseling and BRCA1 and BRCA2 testing. Cervical Cancer Your health care provider may recommend that you be screened regularly for cancer of the pelvic organs (ovaries, uterus, and  vagina). This screening involves a pelvic examination, including checking for microscopic changes to the surface of your cervix (Pap test). You may be encouraged to have this screening done every 3 years, beginning at age 21.  For women ages 30-65, health care providers may recommend pelvic exams and Pap testing every 3 years, or they may recommend the Pap and pelvic exam, combined with testing for human papilloma virus (HPV), every 5 years. Some types of HPV increase your risk of cervical cancer. Testing for HPV may also be done on women of any age with unclear Pap test results.  Other health care providers may not recommend any screening for nonpregnant women who are considered low risk for pelvic cancer and who do not have symptoms. Ask your health care provider if a screening pelvic exam is right for you.  If you have had past treatment for cervical cancer or a condition that could lead to cancer, you need Pap tests and screening for cancer for at least 20 years after your treatment. If Pap tests have been discontinued, your risk factors (such as having a new sexual partner) need to be reassessed to determine if screening should resume. Some women have medical problems that increase the chance of getting cervical cancer. In these cases, your health care provider may recommend more frequent screening and Pap tests. Colorectal Cancer  This type of cancer can be detected and often prevented.  Routine colorectal cancer screening usually begins at 20 years of age and continues through 20 years of age.  Your health care provider may recommend screening at an earlier age if you have risk factors for colon cancer.  Your health care provider may also recommend using home test kits to check for hidden blood in the stool.  A small camera at the end of a tube can be used to examine your colon directly (sigmoidoscopy or colonoscopy). This is done to check for the earliest forms of colorectal  cancer.  Routine screening usually begins at age 50.  Direct examination of the colon should be repeated every 5-10 years through 20 years of age. However, you may need to be screened more often if early forms of precancerous polyps or small growths are found. Skin Cancer  Check your skin from head to toe regularly.  Tell your health care provider about any new moles or changes in moles, especially if there is a change in a mole's shape or color.  Also tell your health care provider if you have a mole that is larger than the size of a pencil eraser.  Always use sunscreen. Apply sunscreen liberally and repeatedly throughout the day.  Protect yourself by wearing long sleeves, pants, a wide-brimmed hat, and sunglasses whenever you are outside. HEART DISEASE, DIABETES, AND HIGH BLOOD PRESSURE   High blood pressure causes heart disease and increases the risk of stroke. High blood pressure is more likely to develop in:  People who have blood pressure in the high end   of the normal range (130-139/85-89 mm Hg).  People who are overweight or obese.  People who are African American.  If you are 38-23 years of age, have your blood pressure checked every 3-5 years. If you are 61 years of age or older, have your blood pressure checked every year. You should have your blood pressure measured twice--once when you are at a hospital or clinic, and once when you are not at a hospital or clinic. Record the average of the two measurements. To check your blood pressure when you are not at a hospital or clinic, you can use:  An automated blood pressure machine at a pharmacy.  A home blood pressure monitor.  If you are between 45 years and 39 years old, ask your health care provider if you should take aspirin to prevent strokes.  Have regular diabetes screenings. This involves taking a blood sample to check your fasting blood sugar level.  If you are at a normal weight and have a low risk for diabetes,  have this test once every three years after 20 years of age.  If you are overweight and have a high risk for diabetes, consider being tested at a younger age or more often. PREVENTING INFECTION  Hepatitis B  If you have a higher risk for hepatitis B, you should be screened for this virus. You are considered at high risk for hepatitis B if:  You were born in a country where hepatitis B is common. Ask your health care provider which countries are considered high risk.  Your parents were born in a high-risk country, and you have not been immunized against hepatitis B (hepatitis B vaccine).  You have HIV or AIDS.  You use needles to inject street drugs.  You live with someone who has hepatitis B.  You have had sex with someone who has hepatitis B.  You get hemodialysis treatment.  You take certain medicines for conditions, including cancer, organ transplantation, and autoimmune conditions. Hepatitis C  Blood testing is recommended for:  Everyone born from [birth year] through 1965.  Anyone with known risk factors for hepatitis C. Sexually transmitted infections (STIs)  You should be screened for sexually transmitted infections (STIs) including gonorrhea and chlamydia if:  You are sexually active and are younger than 20 years of age.  You are older than 20 years of age and your health care provider tells you that you are at risk for this type of infection.  Your sexual activity has changed since you were last screened and you are at an increased risk for chlamydia or gonorrhea. Ask your health care provider if you are at risk.  If you do not have HIV, but are at risk, it may be recommended that you take a prescription medicine daily to prevent HIV infection. This is called pre-exposure prophylaxis (PrEP). You are considered at risk if:  You are sexually active and do not regularly use condoms or know the HIV status of your partner(s).  You take drugs by injection.  You are sexually  active with a partner who has HIV. Talk with your health care provider about whether you are at high risk of being infected with HIV. If you choose to begin PrEP, you should first be tested for HIV. You should then be tested every 3 months for as long as you are taking PrEP.  PREGNANCY   If you are premenopausal and you may become pregnant, ask your health care provider about preconception counseling.  If you may  become pregnant, take 400 to 800 micrograms (mcg) of folic acid every day.  If you want to prevent pregnancy, talk to your health care provider about birth control (contraception). OSTEOPOROSIS AND MENOPAUSE   Osteoporosis is a disease in which the bones lose minerals and strength with aging. This can result in serious bone fractures. Your risk for osteoporosis can be identified using a bone density scan.  If you are 61 years of age or older, or if you are at risk for osteoporosis and fractures, ask your health care provider if you should be screened.  Ask your health care provider whether you should take a calcium or vitamin D supplement to lower your risk for osteoporosis.  Menopause may have certain physical symptoms and risks.  Hormone replacement therapy may reduce some of these symptoms and risks. Talk to your health care provider about whether hormone replacement therapy is right for you.  HOME CARE INSTRUCTIONS   Schedule regular health, dental, and eye exams.  Stay current with your immunizations.   Do not use any tobacco products including cigarettes, chewing tobacco, or electronic cigarettes.  If you are pregnant, do not drink alcohol.  If you are breastfeeding, limit how much and how often you drink alcohol.  Limit alcohol intake to no more than 1 drink per day for nonpregnant women. One drink equals 12 ounces of beer, 5 ounces of wine, or 1 ounces of hard liquor.  Do not use street drugs.  Do not share needles.  Ask your health care provider for help if  you need support or information about quitting drugs.  Tell your health care provider if you often feel depressed.  Tell your health care provider if you have ever been abused or do not feel safe at home.   This information is not intended to replace advice given to you by your health care provider. Make sure you discuss any questions you have with your health care provider.   Document Released: 12/26/2010 Document Revised: 07/03/2014 Document Reviewed: 05/14/2013 Elsevier Interactive Patient Education [employer].

## 2015-12-22 NOTE — Progress Notes (Signed)
Pre visit review using our clinic review tool, if applicable. No additional management support is needed unless otherwise documented below in the visit note.  Intermittently vomiting.  Had pregnancy test at gyn clinic today that was neg.  Sx ongoing for the last ~2 months.  Randomly will get sick.  She might feel well in the AM, still on regular diet, and then get sweaty and then vomit once, then feels better after vomiting.  No blood in vomit.  No fevers. She had labs done at GYN clinic, but she didn't recall the tests.  No jaundice.    BB prn use.  Not used daily.  She has been doing better with her heart rate.  No syncope.  Still lightheaded in the AM, d/w pt about routine cautions and drinking plenty of water. No BLE edema.   Off venlafaxine.  No SI/HI.  She is able to get by as is with her mood.  She some some days that are better than others.  She has some occasional worries but is able to get through her workday.  She didn't feel well on venlafaxine.  She hasn't had success with PRN BB for anxiety.   She has some panic sx monthly, they usually last about 10-15 minutes.    Meds, vitals, and allergies reviewed.   ROS: Per HPI unless specifically indicated in ROS section   GEN: nad, alert and oriented HEENT: mucous membranes moist NECK: supple w/o LA CV: rrr.  PULM: ctab, no inc wob ABD: soft, +bs, not ttp, no masses felt EXT: no edema SKIN: no acute rash

## 2015-12-22 NOTE — Patient Instructions (Addendum)
I would start taking a multivitamin.   Try the atarax for the panic symptoms.  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   Update me as needed.

## 2015-12-23 DIAGNOSIS — R1110 Vomiting, unspecified: Secondary | ICD-10-CM | POA: Insufficient documentation

## 2015-12-23 LAB — BASIC METABOLIC PANEL
BUN/Creatinine Ratio: 21 (ref 9–23)
BUN: 15 mg/dL (ref 6–20)
CO2: 21 mmol/L (ref 18–29)
CREATININE: 0.72 mg/dL (ref 0.57–1.00)
Calcium: 9.3 mg/dL (ref 8.7–10.2)
Chloride: 102 mmol/L (ref 96–106)
GFR calc Af Amer: 140 mL/min/{1.73_m2} (ref >59)
GFR calc non Af Amer: 122 mL/min/{1.73_m2} (ref >59)
GLUCOSE: 82 mg/dL (ref 65–99)
Potassium: 4.4 mmol/L (ref 3.5–5.2)
SODIUM: 138 mmol/L (ref 134–144)

## 2015-12-23 LAB — SPECIMEN STATUS

## 2015-12-23 NOTE — Assessment & Plan Note (Signed)
D/w pt about contraception- she declined at this point.  Advised to be on MVI for general/prenatal care.

## 2015-12-23 NOTE — Assessment & Plan Note (Signed)
>  25 minutes spent in face to face time with patient, >50% spent in counselling or coordination of care D/w pt.  Reasonable to try prn hydroxyzine at low dose and update me as needed.  She agrees.

## 2015-12-23 NOTE — Assessment & Plan Note (Signed)
Self limited w/o other associated GI complaints.  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  She'll monitor for now.  We only wanted to start one med at a time, see hydroxyzine discussion, we can address if persistent.

## 2015-12-23 NOTE — Assessment & Plan Note (Signed)
Continue prn BB.  D/w pt.  She agrees.  Continue routine fluid intake, etc, d/w pt.

## 2015-12-25 LAB — NUSWAB VAGINITIS PLUS (VG+)
CANDIDA GLABRATA, NAA: NEGATIVE
CHLAMYDIA TRACHOMATIS, NAA: NEGATIVE
Candida albicans, NAA: POSITIVE — AB
NEISSERIA GONORRHOEAE, NAA: NEGATIVE
TRICH VAG BY NAA: NEGATIVE

## 2015-12-31 MED ORDER — FLUCONAZOLE 150 MG PO TABS: 150.0000 mg | ORAL_TABLET | Freq: Once | ORAL | Status: DC

## 2015-12-31 NOTE — Addendum Note (Signed)
Addended by: Jackquline Denmark on: 12/31/2015 04:02 PM   Modules accepted: Orders

## 2016-01-05 ENCOUNTER — Other Ambulatory Visit: Payer: 59

## 2016-01-06 ENCOUNTER — Ambulatory Visit (INDEPENDENT_AMBULATORY_CARE_PROVIDER_SITE_OTHER): Payer: 59

## 2016-01-06 DIAGNOSIS — IMO0001 Reserved for inherently not codable concepts without codable children: Secondary | ICD-10-CM

## 2016-01-06 DIAGNOSIS — Z8742 Personal history of other diseases of the female genital tract: Secondary | ICD-10-CM

## 2016-01-06 DIAGNOSIS — F526 Dyspareunia not due to a substance or known physiological condition: Secondary | ICD-10-CM

## 2016-01-25 ENCOUNTER — Telehealth: Payer: Self-pay

## 2016-01-25 NOTE — Telephone Encounter (Signed)
Called pt informed her of the information below.              Please inform patient of ultrasound results: (normal excpet small simple cyst ~ size of a penny on right ovary, likely due to where she was in her menstrual cycle). Usually does not cause problems and resolves on it's own.

## 2016-02-22 ENCOUNTER — Telehealth: Payer: Self-pay

## 2016-02-22 NOTE — Telephone Encounter (Signed)
Patient advised.

## 2016-02-22 NOTE — Telephone Encounter (Signed)
Pt left /vm; pt has been taking propranolol but recently has positive pregnancy test; pt wants to know if can continue taking propranolol for over active heart. Pt request cb.

## 2016-02-22 NOTE — Telephone Encounter (Signed)
I ran her meds for pregnancy.   Propranolol is category C.   Acyclovir is category B.   Hydroxyzine is contraindicated in early pregnancy.  She shouldn't take that.  I took if off the med list.    I wouldn't take hydroxyzine now.  Acyclovir is likely okay.   I'm going to route this to OB and cards for input re: potentially using a different beta blocker.   I appreciate help of all involved.   Thanks.

## 2016-02-23 ENCOUNTER — Encounter: Payer: Self-pay | Admitting: Obstetrics and Gynecology

## 2016-02-23 ENCOUNTER — Ambulatory Visit (INDEPENDENT_AMBULATORY_CARE_PROVIDER_SITE_OTHER): Payer: 59 | Admitting: Obstetrics and Gynecology

## 2016-02-23 VITALS — BP 106/67 | HR 80 | Ht 67.0 in | Wt 113.7 lb

## 2016-02-23 DIAGNOSIS — N926 Irregular menstruation, unspecified: Secondary | ICD-10-CM | POA: Diagnosis not present

## 2016-02-23 LAB — POCT URINE PREGNANCY: PREG TEST UR: POSITIVE — AB

## 2016-02-23 NOTE — Telephone Encounter (Signed)
See note from cards below re: propranolol.  Unless she gets notification from OB o/w, then I would continue propranolol for now.  Thanks.

## 2016-02-23 NOTE — Progress Notes (Signed)
Subjective:     Patient ID: Monique Underwood, female   DOB: 1995-07-13, 20 y.o.   MRN: [id number]  HPI Reports LMP 01/20/16 with +UPT at home yesterday, denies any symptoms. Happy about unplanned pregnancy EDD 10/26/16, and EGA [redacted]w[redacted]d  Review of Systems See above    Objective:   Physical Exam A&O x4  well groomed female in no distress Blood pressure 106/67, pulse 80, height 5\' 7"  (1.702 m), weight 113 lb 11.2 oz (51.6 kg), last menstrual period 01/20/2016. UPT+    Assessment:     Missed menses    Plan:     RTC in 3 weeks for viability scan, Nurse intake & OB labs. Smoking cessation discussed Concept DHA samples given to start  Harlow Mares, CNM

## 2016-02-23 NOTE — Telephone Encounter (Signed)
Patient notified as instructed by telephone and verbalized understanding. 

## 2016-02-23 NOTE — Patient Instructions (Signed)
First Trimester of Pregnancy The first trimester of pregnancy is from week 1 until the end of week 12 (months 1 through 3). A week after a sperm fertilizes an egg, the egg will implant on the wall of the uterus. This embryo will begin to develop into a baby. Genes from you and your partner are forming the baby. The female genes determine whether the baby is a boy or a girl. At 6-8 weeks, the eyes and face are formed, and the heartbeat can be seen on ultrasound. At the end of 12 weeks, all the baby's organs are formed.  Now that you are pregnant, you will want to do everything you can to have a healthy baby. Two of the most important things are to get good prenatal care and to follow your health care provider's instructions. Prenatal care is all the medical care you receive before the baby's birth. This care will help prevent, find, and treat any problems during the pregnancy and childbirth. BODY CHANGES Your body goes through many changes during pregnancy. The changes vary from woman to woman.   You may gain or lose a couple of pounds at first.  You may feel sick to your stomach (nauseous) and throw up (vomit). If the vomiting is uncontrollable, call your health care provider.  You may tire easily.  You may develop headaches that can be relieved by medicines approved by your health care provider.  You may urinate more often. Painful urination may mean you have a bladder infection.  You may develop heartburn as a result of your pregnancy.  You may develop constipation because certain hormones are causing the muscles that push waste through your intestines to slow down.  You may develop hemorrhoids or swollen, bulging veins (varicose veins).  Your breasts may begin to grow larger and become tender. Your nipples may stick out more, and the tissue that surrounds them (areola) may become darker.  Your gums may bleed and may be sensitive to brushing and flossing.  Dark spots or blotches (chloasma,  mask of pregnancy) may develop on your face. This will likely fade after the baby is born.  Your menstrual periods will stop.  You may have a loss of appetite.  You may develop cravings for certain kinds of food.  You may have changes in your emotions from day to day, such as being excited to be pregnant or being concerned that something may go wrong with the pregnancy and baby.  You may have more vivid and strange dreams.  You may have changes in your hair. These can include thickening of your hair, rapid growth, and changes in texture. Some women also have hair loss during or after pregnancy, or hair that feels dry or thin. Your hair will most likely return to normal after your baby is born. WHAT TO EXPECT AT YOUR PRENATAL VISITS During a routine prenatal visit:  You will be weighed to make sure you and the baby are growing normally.  Your blood pressure will be taken.  Your abdomen will be measured to track your baby's growth.  The fetal heartbeat will be listened to starting around week 10 or 12 of your pregnancy.  Test results from any previous visits will be discussed. Your health care provider may ask you:  How you are feeling.  If you are feeling the baby move.  If you have had any abnormal symptoms, such as leaking fluid, bleeding, severe headaches, or abdominal cramping.  If you are using any tobacco products,   including cigarettes, chewing tobacco, and electronic cigarettes.  If you have any questions. Other tests that may be performed during your first trimester include:  Blood tests to find your blood type and to check for the presence of any previous infections. They will also be used to check for low iron levels (anemia) and Rh antibodies. Later in the pregnancy, blood tests for diabetes will be done along with other tests if problems develop.  Urine tests to check for infections, diabetes, or protein in the urine.  An ultrasound to confirm the proper growth  and development of the baby.  An amniocentesis to check for possible genetic problems.  Fetal screens for spina bifida and Down syndrome.  You may need other tests to make sure you and the baby are doing well.  HIV (human immunodeficiency virus) testing. Routine prenatal testing includes screening for HIV, unless you choose not to have this test. HOME CARE INSTRUCTIONS  Medicines  Follow your health care provider's instructions regarding medicine use. Specific medicines may be either safe or unsafe to take during pregnancy.  Take your prenatal vitamins as directed.  If you develop constipation, try taking a stool softener if your health care provider approves. Diet  Eat regular, well-balanced meals. Choose a variety of foods, such as meat or vegetable-based protein, fish, milk and low-fat dairy products, vegetables, fruits, and whole grain breads and cereals. Your health care provider will help you determine the amount of weight gain that is right for you.  Avoid raw meat and uncooked cheese. These carry germs that can cause birth defects in the baby.  Eating four or five small meals rather than three large meals a day may help relieve nausea and vomiting. If you start to feel nauseous, eating a few soda crackers can be helpful. Drinking liquids between meals instead of during meals also seems to help nausea and vomiting.  If you develop constipation, eat more high-fiber foods, such as fresh vegetables or fruit and whole grains. Drink enough fluids to keep your urine clear or pale yellow. Activity and Exercise  Exercise only as directed by your health care provider. Exercising will help you:  Control your weight.  Stay in shape.  Be prepared for labor and delivery.  Experiencing pain or cramping in the lower abdomen or low back is a good sign that you should stop exercising. Check with your health care provider before continuing normal exercises.  Try to avoid standing for long  periods of time. Move your legs often if you must stand in one place for a long time.  Avoid heavy lifting.  Wear low-heeled shoes, and practice good posture.  You may continue to have sex unless your health care provider directs you otherwise. Relief of Pain or Discomfort  Wear a good support bra for breast tenderness.   Take warm sitz baths to soothe any pain or discomfort caused by hemorrhoids. Use hemorrhoid cream if your health care provider approves.   Rest with your legs elevated if you have leg cramps or low back pain.  If you develop varicose veins in your legs, wear support hose. Elevate your feet for 15 minutes, 3-4 times a day. Limit salt in your diet. Prenatal Care  Schedule your prenatal visits by the twelfth week of pregnancy. They are usually scheduled monthly at first, then more often in the last 2 months before delivery.  Write down your questions. Take them to your prenatal visits.  Keep all your prenatal visits as directed by your   health care provider. Safety  Wear your seat belt at all times when driving.  Make a list of emergency phone numbers, including numbers for family, friends, the hospital, and police and fire departments. General Tips  Ask your health care provider for a referral to a local prenatal education class. Begin classes no later than at the beginning of month 6 of your pregnancy.  Ask for help if you have counseling or nutritional needs during pregnancy. Your health care provider can offer advice or refer you to specialists for help with various needs.  Do not use hot tubs, steam rooms, or saunas.  Do not douche or use tampons or scented sanitary pads.  Do not cross your legs for long periods of time.  Avoid cat litter boxes and soil used by cats. These carry germs that can cause birth defects in the baby and possibly loss of the fetus by miscarriage or stillbirth.  Avoid all smoking, herbs, alcohol, and medicines not prescribed by  your health care provider. Chemicals in these affect the formation and growth of the baby.  Do not use any tobacco products, including cigarettes, chewing tobacco, and electronic cigarettes. If you need help quitting, ask your health care provider. You may receive counseling support and other resources to help you quit.  Schedule a dentist appointment. At home, brush your teeth with a soft toothbrush and be gentle when you floss. SEEK MEDICAL CARE IF:   You have dizziness.  You have mild pelvic cramps, pelvic pressure, or nagging pain in the abdominal area.  You have persistent nausea, vomiting, or diarrhea.  You have a bad smelling vaginal discharge.  You have pain with urination.  You notice increased swelling in your face, hands, legs, or ankles. SEEK IMMEDIATE MEDICAL CARE IF:   You have a fever.  You are leaking fluid from your vagina.  You have spotting or bleeding from your vagina.  You have severe abdominal cramping or pain.  You have rapid weight gain or loss.  You vomit blood or material that looks like coffee grounds.  You are exposed to [place] measles and have never had them.  You are exposed to fifth disease or chickenpox.  You develop a severe headache.  You have shortness of breath.  You have any kind of trauma, such as from a fall or a car accident.   This information is not intended to replace advice given to you by your health care provider. Make sure you discuss any questions you have with your health care provider.   Document Released: 06/06/2001 Document Revised: 07/03/2014 Document Reviewed: 04/22/2013 Elsevier Interactive Patient Education 2016 [employer]  Pregnancy and Smoking Smoking during pregnancy is unhealthy for you and your developing baby. The addictive drug nicotine, carbon monoxide, and many other poisons are inhaled from a cigarette and carried through your bloodstream to your baby. Cigarette smoke contains more than 2,500  chemicals. It is not known which of these are harmful to a developing baby. However, both nicotine and carbon monoxide play a role in causing health problems in pregnancy. Smoking during pregnancy increases the risk of:  Birth defects in your baby, including heart defects.  Miscarriage and stillbirth.  Birth before 37 completed weeks of pregnancy (premature birth).  Pregnancy outside of the uterus (tubal pregnancy).  Attachment of the placenta over the opening of the uterus (placenta previa).  Detachment of the placenta before the baby's birth (placental abruption).  Breaking of the bag of waters before labor begins (premature rupture  of membranes). HOW DOES SMOKING DURING PREGNANCY AFFECT MY BABY? Before Birth Smoking during pregnancy:  Decreases blood flow and oxygen to your baby.  Increases the heart rate of your baby.  Slows your baby's growth in the uterus (intrauterine growth retardation). After Birth Babies born to women who smoke during pregnancy are more likely to have a low birth weight. They are also at risk for:  Serious health problems, chronic or lifelong disabilities (cerebral palsy, mental retardation, learning problems), and death.  Sudden infant death syndrome (SIDS).  Lung and breathing problems. WHAT RESOURCES ARE AVAILABLE TO HELP ME STOP SMOKING?  Ask your health care provider for help to stop smoking. The following resources are available:  Counseling.  Psychological treatment.  Acupuncture.  Family intervention.  Hypnosis.  Nicotine supplements have not been studied enough to know if they are safe to use during pregnancy. They should only be considered when all other methods fail, and if used under the close supervision of your health care provider.  Telephone QUIT lines. The national smoking cessation telephone hotline number is 1-800-QUIT NOW. FOR MORE INFORMATION  American Cancer Society: www.cancer.org  American Heart Association:  www.heart.org  National Cancer Institute: www.cancer.gov  March of Dimes: www.marchofdimes.org   This information is not intended to replace advice given to you by your health care provider. Make sure you discuss any questions you have with your health care provider.   Document Released: 10/24/2004 Document Revised: 06/17/2013 Document Reviewed: 05/12/2013 Elsevier Interactive Patient Education [employer]

## 2016-02-23 NOTE — Telephone Encounter (Signed)
Her symptoms may well improve as she moves nto the second trimester and her propranolol could be stopped about then

## 2016-02-24 NOTE — Telephone Encounter (Signed)
MN'S to review. Consider converting propanolol to metoprolol.

## 2016-02-29 ENCOUNTER — Telehealth: Payer: Self-pay | Admitting: Obstetrics and Gynecology

## 2016-02-29 NOTE — Telephone Encounter (Signed)
PT CALLED AND SHE HAD HER NOB INTAKE LAST WEEK AND SHE HAD HER NEW OB PE ON 9/21 AND SHE IS ABOUT [redacted] WEEKS PREGNANT AND SHE IS WANTING SOMETHING FOR NAUSEA, SHE ALSO HAS A HEART CONDITION CAUSES HER TO HAVE NAUSEA, HEART CONDITION IS  EDS 3.

## 2016-03-01 NOTE — Telephone Encounter (Signed)
Sent pt mychart message

## 2016-03-03 ENCOUNTER — Other Ambulatory Visit: Payer: Self-pay

## 2016-03-03 ENCOUNTER — Telehealth: Payer: Self-pay | Admitting: Obstetrics and Gynecology

## 2016-03-03 DIAGNOSIS — O219 Vomiting of pregnancy, unspecified: Secondary | ICD-10-CM

## 2016-03-03 MED ORDER — DOXYLAMINE-PYRIDOXINE 10-10 MG PO TBEC: 10.0000 mg | DELAYED_RELEASE_TABLET | ORAL | 3 refills | Status: DC | PRN

## 2016-03-03 NOTE — Telephone Encounter (Signed)
Pt is [redacted]wks pregnant she is naused. She is wanting tot [facility] therap[facility] Diclegis. But call her fist and see if that's what she needs. We have coupons for Diclegis and she would need to come get one before she picks up her RX

## 2016-03-03 NOTE — Telephone Encounter (Signed)
Called pt she states that she is experiencing nausea all day long with no relief. Advised pt on small frequent meals, ginger ale, saltine crackers, and coke. Pt gave verbal understanding. Will also send script for Diclegis.

## 2016-03-07 ENCOUNTER — Other Ambulatory Visit: Payer: Self-pay | Admitting: *Deleted

## 2016-03-07 ENCOUNTER — Other Ambulatory Visit: Payer: Self-pay | Admitting: Obstetrics and Gynecology

## 2016-03-07 MED ORDER — METOPROLOL TARTRATE 25 MG PO TABS: 25.0000 mg | ORAL_TABLET | Freq: Two times a day (BID) | ORAL | 6 refills | Status: DC

## 2016-03-07 MED ORDER — ONDANSETRON 4 MG PO TBDP
4.0000 mg | ORAL_TABLET | Freq: Three times a day (TID) | ORAL | 1 refills | Status: DC | PRN
Start: 2016-03-07 — End: 2016-04-20

## 2016-03-07 NOTE — Telephone Encounter (Signed)
Left detailed message for pt 

## 2016-03-07 NOTE — Telephone Encounter (Signed)
Please let her know I switched BP meds- she is to switch now and take one pill two times daily.

## 2016-03-16 ENCOUNTER — Ambulatory Visit (INDEPENDENT_AMBULATORY_CARE_PROVIDER_SITE_OTHER): Payer: 59

## 2016-03-16 DIAGNOSIS — N926 Irregular menstruation, unspecified: Secondary | ICD-10-CM | POA: Diagnosis not present

## 2016-03-17 ENCOUNTER — Ambulatory Visit (INDEPENDENT_AMBULATORY_CARE_PROVIDER_SITE_OTHER): Payer: 59 | Admitting: Obstetrics and Gynecology

## 2016-03-17 VITALS — BP 94/60 | HR 84 | Wt 116.0 lb

## 2016-03-17 DIAGNOSIS — Z113 Encounter for screening for infections with a predominantly sexual mode of transmission: Secondary | ICD-10-CM

## 2016-03-17 DIAGNOSIS — Z369 Encounter for antenatal screening, unspecified: Secondary | ICD-10-CM

## 2016-03-17 DIAGNOSIS — Z36 Encounter for antenatal screening of mother: Secondary | ICD-10-CM

## 2016-03-17 DIAGNOSIS — Z3401 Encounter for supervision of normal first pregnancy, first trimester: Secondary | ICD-10-CM

## 2016-03-17 NOTE — Progress Notes (Signed)
[name] presents for NOB nurse interview visit. G1.  P0. Pregnancy education material explained and given. 1 cat in the home, pt states that FOB has been changing litter box. Toxoplasma ordered. NOB labs ordered. HIV lab screen was explained optional and she could opt out of test but did not decline. Drug screen declined. PNV encouraged. NT to discuss with provider. Pt concerned about her heart condition possibly being congential and how that may effect pregnancy. Advised pt that I would relay concerns to provider. Also offered pt counseling on smoking cessation and offered resources ([employer]). Pt given samples of Diclegis for N/V Pt. To follow up with provider in 4 weeks for NOB physical.  All questions answered.

## 2016-03-17 NOTE — Patient Instructions (Signed)
Pregnancy and Smoking Smoking during pregnancy is unhealthy for you and your developing baby. The addictive drug nicotine, carbon monoxide, and many other poisons are inhaled from a cigarette and carried through your bloodstream to your baby. Cigarette smoke contains more than 2,500 chemicals. It is not known which of these are harmful to a developing baby. However, both nicotine and carbon monoxide play a role in causing health problems in pregnancy. Smoking during pregnancy increases the risk of:  Birth defects in your baby, including heart defects.  Miscarriage and stillbirth.  Birth before 37 completed weeks of pregnancy (premature birth).  Pregnancy outside of the uterus (tubal pregnancy).  Attachment of the placenta over the opening of the uterus (placenta previa).  Detachment of the placenta before the baby's birth (placental abruption).  Breaking of the bag of waters before labor begins (premature rupture of membranes). HOW DOES SMOKING DURING PREGNANCY AFFECT MY BABY? Before Birth Smoking during pregnancy:  Decreases blood flow and oxygen to your baby.  Increases the heart rate of your baby.  Slows your baby's growth in the uterus (intrauterine growth retardation). After Birth Babies born to women who smoke during pregnancy are more likely to have a low birth weight. They are also at risk for:  Serious health problems, chronic or lifelong disabilities (cerebral palsy, mental retardation, learning problems), and death.  Sudden infant death syndrome (SIDS).  Lung and breathing problems. WHAT RESOURCES ARE AVAILABLE TO HELP ME STOP SMOKING?  Ask your health care provider for help to stop smoking. The following resources are available:  Counseling.  Psychological treatment.  Acupuncture.  Family intervention.  Hypnosis.  Nicotine supplements have not been studied enough to know if they are safe to use during pregnancy. They should only be considered when all other  methods fail, and if used under the close supervision of your health care provider.  Telephone QUIT lines. The national smoking cessation telephone hotline number is 1-800-QUIT NOW. FOR MORE INFORMATION  American Cancer Society: www.cancer.org  American Heart Association: www.heart.org  National Cancer Institute: www.cancer.gov  March of Dimes: www.marchofdimes.org   This information is not intended to replace advice given to you by your health care provider. Make sure you discuss any questions you have with your health care provider.   Document Released: 10/24/2004 Document Revised: 06/17/2013 Document Reviewed: 05/12/2013 Elsevier Interactive Patient Education [employer]

## 2016-03-18 LAB — URINALYSIS, ROUTINE W REFLEX MICROSCOPIC
BILIRUBIN UA: NEGATIVE
Glucose, UA: NEGATIVE
Ketones, UA: NEGATIVE
LEUKOCYTES UA: NEGATIVE
Nitrite, UA: NEGATIVE
PH UA: 6 (ref 5.0–7.5)
PROTEIN UA: NEGATIVE
RBC UA: NEGATIVE
Specific Gravity, UA: 1.028 (ref 1.005–1.030)
Urobilinogen, Ur: 0.2 mg/dL (ref 0.2–1.0)

## 2016-03-18 LAB — URINE CULTURE: ORGANISM ID, BACTERIA: NO GROWTH

## 2016-03-20 LAB — CBC WITH DIFFERENTIAL/PLATELET
BASOS: 0 %
Basophils Absolute: 0 10*3/uL (ref 0.0–0.2)
EOS (ABSOLUTE): 0.1 10*3/uL (ref 0.0–0.4)
EOS: 1 %
HEMATOCRIT: 39.4 % (ref 34.0–46.6)
HEMOGLOBIN: 13.7 g/dL (ref 11.1–15.9)
Immature Grans (Abs): 0 10*3/uL (ref 0.0–0.1)
Immature Granulocytes: 0 %
LYMPHS ABS: 2.3 10*3/uL (ref 0.7–3.1)
Lymphs: 26 %
MCH: 31.4 pg (ref 26.6–33.0)
MCHC: 34.8 g/dL (ref 31.5–35.7)
MCV: 90 fL (ref 79–97)
MONOCYTES: 9 %
Monocytes Absolute: 0.7 10*3/uL (ref 0.1–0.9)
NEUTROS ABS: 5.6 10*3/uL (ref 1.4–7.0)
Neutrophils: 64 %
Platelets: 240 10*3/uL (ref 150–379)
RBC: 4.36 x10E6/uL (ref 3.77–5.28)
RDW: 13.3 % (ref 12.3–15.4)
WBC: 8.7 10*3/uL (ref 3.4–10.8)

## 2016-03-20 LAB — VARICELLA ZOSTER ANTIBODY, IGG: Varicella zoster IgG: 839 index (ref >165)

## 2016-03-20 LAB — TOXOPLASMA ANTIBODIES- IGG AND  IGM
Toxoplasma Antibody- IgM: <3 AU/mL (ref 0.0–7.9)
Toxoplasma IgG Ratio: <3 IU/mL (ref 0.0–7.1)

## 2016-03-20 LAB — ANTIBODY SCREEN: Antibody Screen: NEGATIVE

## 2016-03-20 LAB — HEP, RPR, HIV PANEL
HIV SCREEN 4TH GENERATION: NONREACTIVE
Hepatitis B Surface Ag: NEGATIVE
RPR Ser Ql: NONREACTIVE

## 2016-03-20 LAB — RUBELLA SCREEN: RUBELLA: 1.26 {index} (ref >0.99)

## 2016-03-20 LAB — SICKLE CELL SCREEN: Sickle Cell Screen: NEGATIVE

## 2016-03-20 LAB — ABO AND RH: Rh Factor: NEGATIVE

## 2016-04-06 ENCOUNTER — Telehealth: Payer: Self-pay | Admitting: Obstetrics and Gynecology

## 2016-04-06 MED ORDER — CITRANATAL B-CALM 20-1 MG & 2 X 25 MG PO MISC: 20.0000 mg | Freq: Every day | ORAL | 11 refills | Status: DC

## 2016-04-06 NOTE — Telephone Encounter (Signed)
Pt wants to know if she can come get some more samples of prenatals and diclegis. She said the diclegis worked real good.

## 2016-04-09 IMAGING — CT CT ANGIO CHEST
2 of 6 series · 19 of 36 positions shown · IV contrast (omnipaque)
Comparison: None.

CLINICAL DATA: Supraventricular tachycardia. Connective tissue
disorder. Evaluate for dissection or aneurysm.

EXAM:
CT ANGIOGRAPHY CHEST WITH CONTRAST
TECHNIQUE: Multidetector CT imaging of the chest was performed using the
standard protocol during bolus administration of intravenous
contrast. Multiplanar CT image reconstructions and MIPs were
obtained to evaluate the vascular anatomy.
CONTRAST:  75mL OMNIPAQUE IOHEXOL 350 MG/ML SOLN

[Series 6: arterial · axial · arterial · 0.58mm/px · z∈[-320,-24]mm · 18 of 166 slices shown]
[im 9/166  lung]
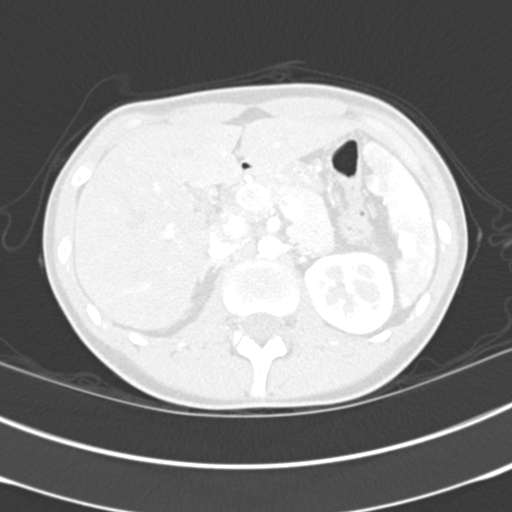
[im 18/166  mediastinal]
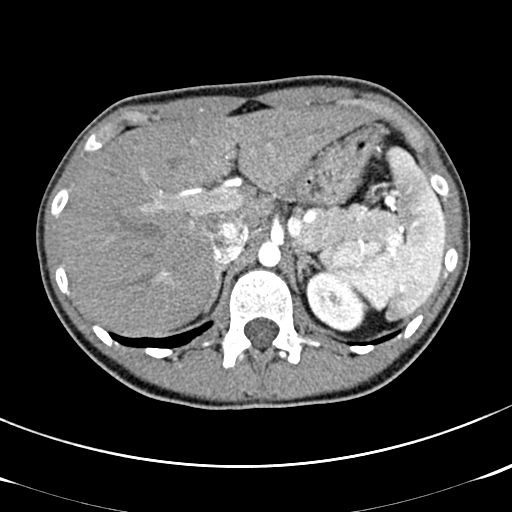
[im 27/166  lung]
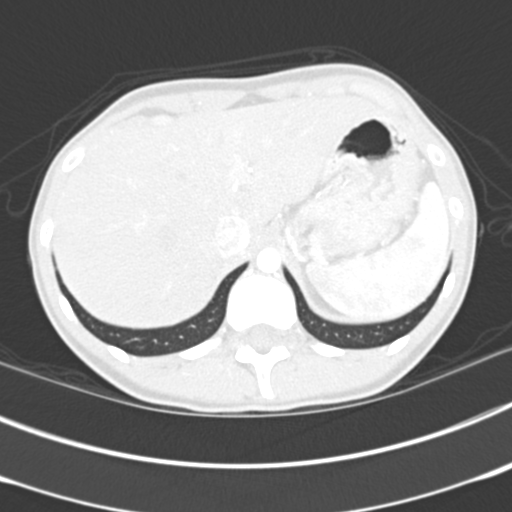
[im 35/166  mediastinal]
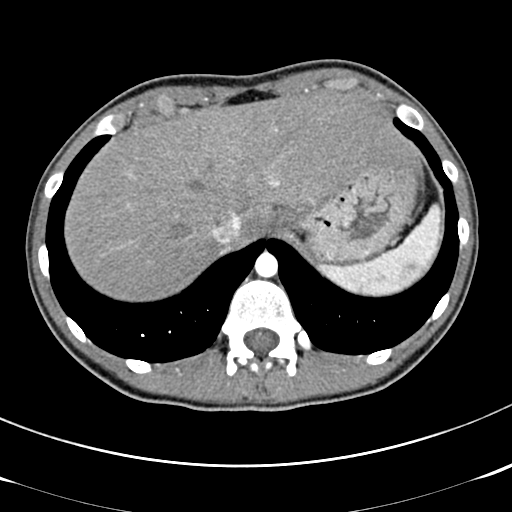
[im 44/166  lung]
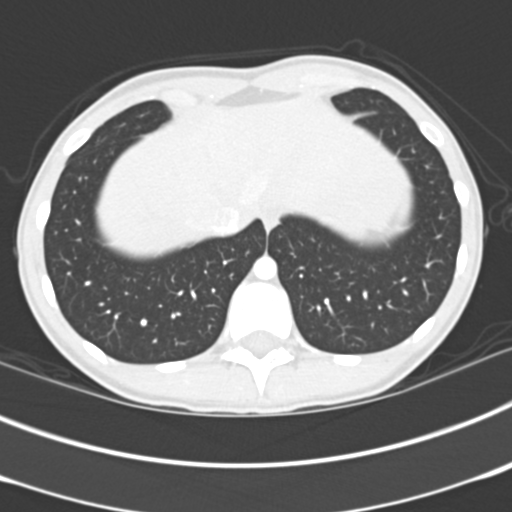
[im 53/166  mediastinal]
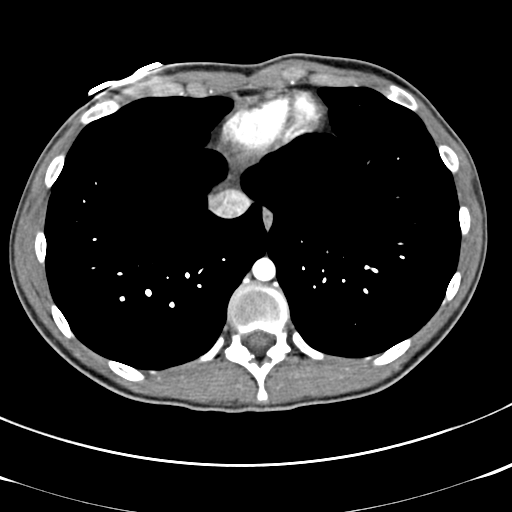
[im 61/166  lung]
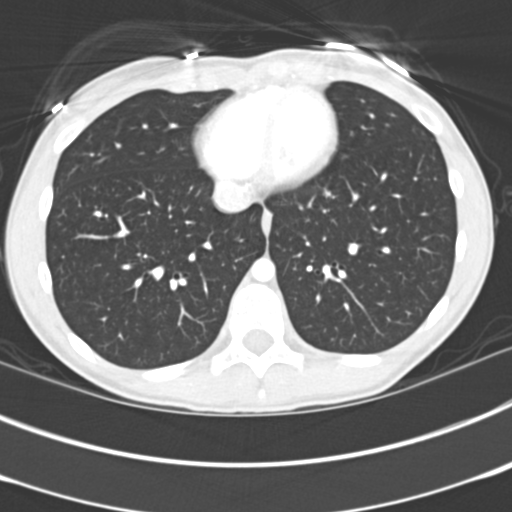
[im 70/166  mediastinal]
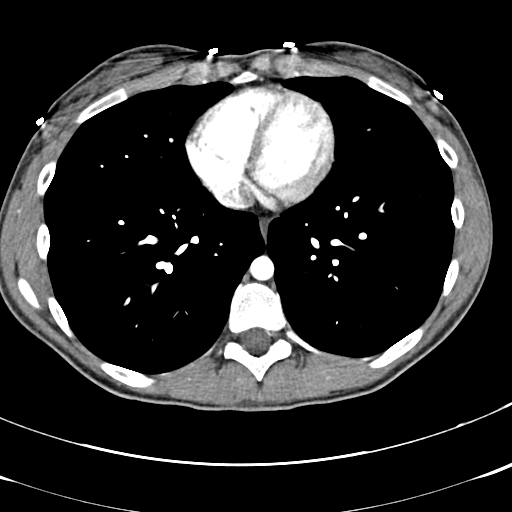
[im 79/166  lung]
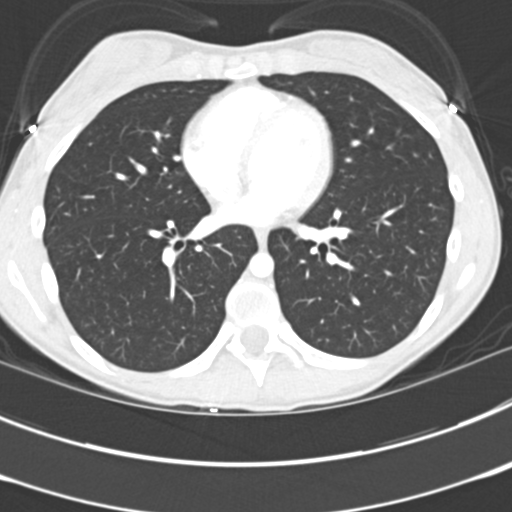
[im 87/166  mediastinal]
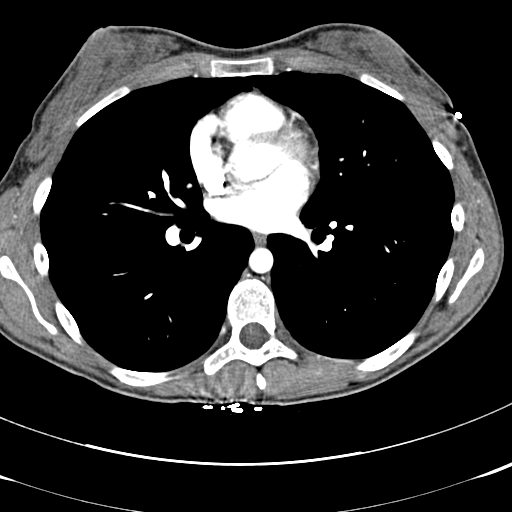
[im 96/166  lung]
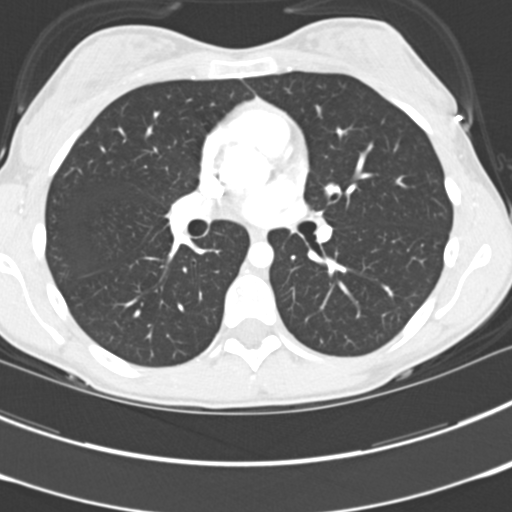
[im 105/166  mediastinal]
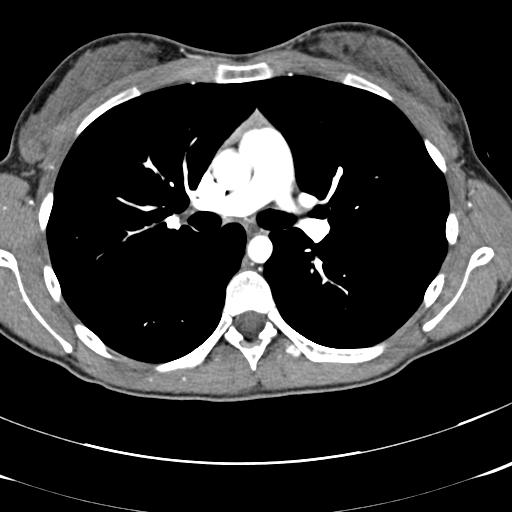
[im 113/166  lung]
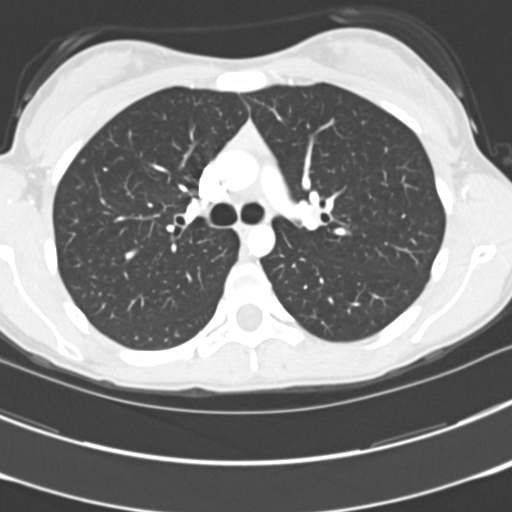
[im 122/166  mediastinal]
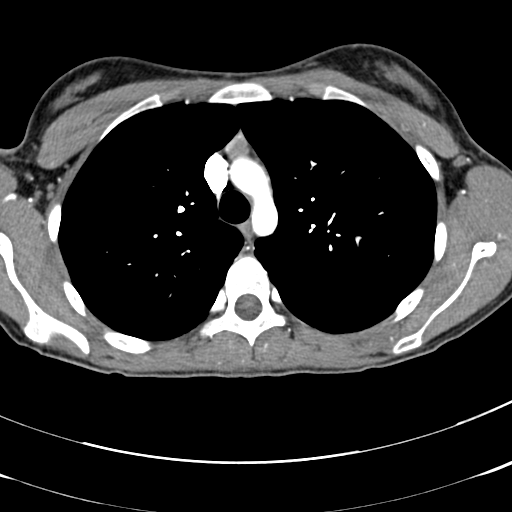
[im 131/166  lung]
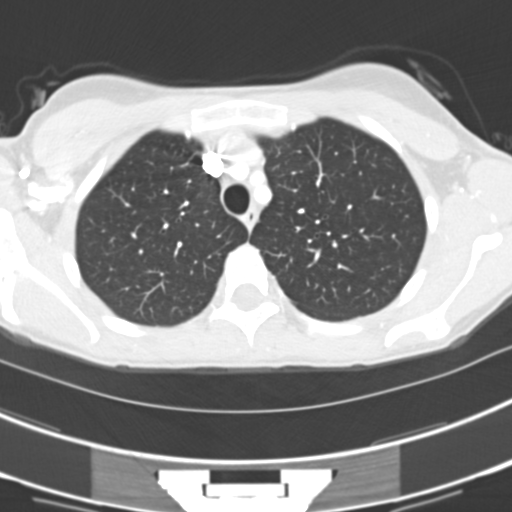
[im 139/166  mediastinal]
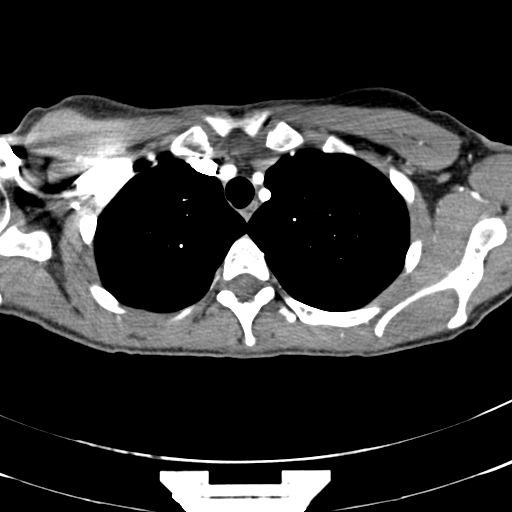
[im 148/166  lung]
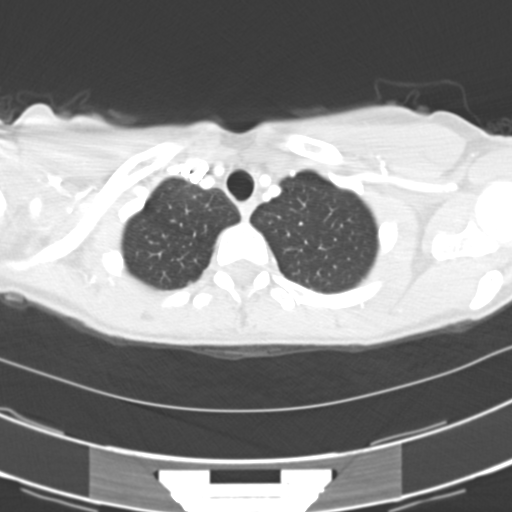
[im 157/166  mediastinal]
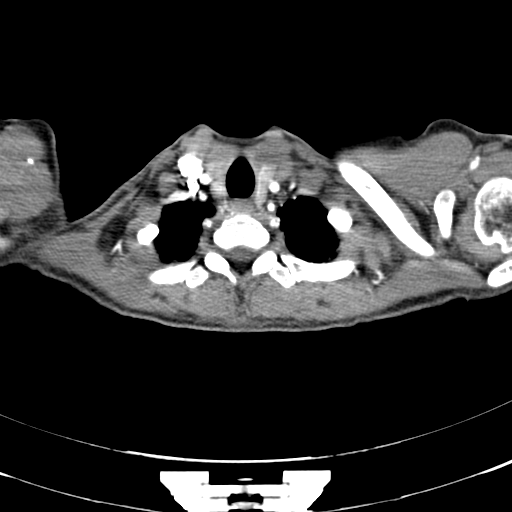

[Series 8: cor arterial mpr · coronal · arterial · 0.54mm/px · 1 of 108 slices shown]
[im 54/108  mediastinal]
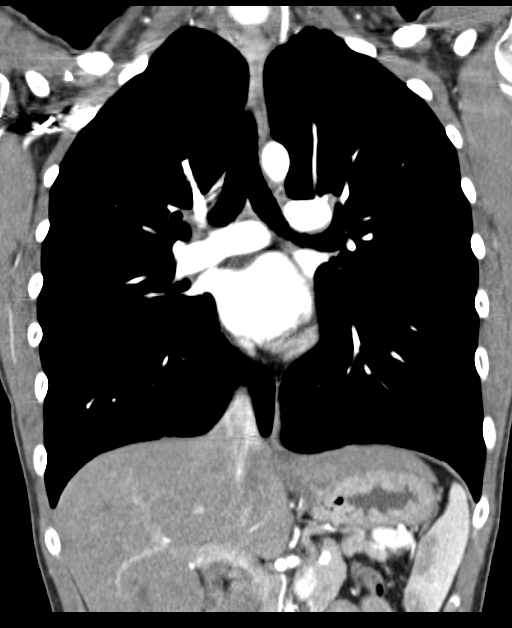

[19 of 36 positions shown; findings below may reference images not displayed]

FINDINGS: THORACIC INLET/BODY WALL:

No acute abnormality.

MEDIASTINUM:

Normal heart size with no pericardial effusion. Aortic valve is
visualized with motion degradation but appears to have 3 leaflets.
No aortic dilatation, dissection, or wall thickening. Diameter at
the aortic root is 25 mm. Diameter at the Jag junction is
22 mm. No intramural hematoma or atheromatous change. There is no
pectus excavatum. No indication of vertebral body scalloping/dural
ectasia. Normal thymic size for age. No adenopathy.

LUNG WINDOWS:

No consolidation.  No effusion.  No suspicious pulmonary nodule.

UPPER ABDOMEN:

No acute findings.

OSSEOUS:

No vertebral body scalloping.

Review of the MIP images confirms the above findings.
IMPRESSION: Negative chest CTA.  No aortic dilatation or pectus excavatum.

## 2016-04-18 ENCOUNTER — Encounter: Payer: Self-pay | Admitting: Emergency Medicine

## 2016-04-18 ENCOUNTER — Inpatient Hospital Stay: Payer: 59 | Admitting: Anesthesiology

## 2016-04-18 ENCOUNTER — Emergency Department: Payer: 59

## 2016-04-18 ENCOUNTER — Encounter
Admission: EM | Disposition: A | Discharge status: Home / Self Care | Payer: Self-pay | Source: Home / Self Care | Attending: Obstetrics and Gynecology

## 2016-04-18 ENCOUNTER — Inpatient Hospital Stay
Admission: EM | Admit: 2016-04-18 | Discharge: 2016-04-20 | DRG: 854 | Disposition: A | Discharge status: Home / Self Care | Payer: 59 | Attending: Obstetrics and Gynecology | Admitting: Obstetrics and Gynecology

## 2016-04-18 DIAGNOSIS — D62 Acute posthemorrhagic anemia: Secondary | ICD-10-CM | POA: Diagnosis not present

## 2016-04-18 DIAGNOSIS — F419 Anxiety disorder, unspecified: Secondary | ICD-10-CM | POA: Diagnosis present

## 2016-04-18 DIAGNOSIS — R011 Cardiac murmur, unspecified: Secondary | ICD-10-CM | POA: Diagnosis present

## 2016-04-18 DIAGNOSIS — N10 Acute tubulo-interstitial nephritis: Secondary | ICD-10-CM | POA: Diagnosis present

## 2016-04-18 DIAGNOSIS — O0383 Metabolic disorder following complete or unspecified spontaneous abortion: Secondary | ICD-10-CM | POA: Diagnosis present

## 2016-04-18 DIAGNOSIS — F17200 Nicotine dependence, unspecified, uncomplicated: Secondary | ICD-10-CM | POA: Diagnosis present

## 2016-04-18 DIAGNOSIS — Z3A12 12 weeks gestation of pregnancy: Secondary | ICD-10-CM

## 2016-04-18 DIAGNOSIS — O0387 Sepsis following complete or unspecified spontaneous abortion: Secondary | ICD-10-CM | POA: Diagnosis not present

## 2016-04-18 DIAGNOSIS — N730 Acute parametritis and pelvic cellulitis: Secondary | ICD-10-CM

## 2016-04-18 DIAGNOSIS — O0330 Unspecified complication following incomplete spontaneous abortion: Secondary | ICD-10-CM | POA: Diagnosis present

## 2016-04-18 DIAGNOSIS — O9902 Anemia complicating childbirth: Secondary | ICD-10-CM | POA: Diagnosis present

## 2016-04-18 DIAGNOSIS — Q796 Ehlers-Danlos syndrome: Secondary | ICD-10-CM

## 2016-04-18 DIAGNOSIS — O26891 Other specified pregnancy related conditions, first trimester: Secondary | ICD-10-CM | POA: Diagnosis present

## 2016-04-18 DIAGNOSIS — O0388 Urinary tract infection following complete or unspecified spontaneous abortion: Secondary | ICD-10-CM | POA: Diagnosis present

## 2016-04-18 DIAGNOSIS — A419 Sepsis, unspecified organism: Principal | ICD-10-CM | POA: Diagnosis present

## 2016-04-18 DIAGNOSIS — O021 Missed abortion: Secondary | ICD-10-CM

## 2016-04-18 DIAGNOSIS — O0289 Other abnormal products of conception: Secondary | ICD-10-CM

## 2016-04-18 DIAGNOSIS — O9912 Other diseases of the blood and blood-forming organs and certain disorders involving the immune mechanism complicating childbirth: Secondary | ICD-10-CM | POA: Diagnosis present

## 2016-04-18 DIAGNOSIS — O99284 Endocrine, nutritional and metabolic diseases complicating childbirth: Secondary | ICD-10-CM | POA: Diagnosis present

## 2016-04-18 DIAGNOSIS — O99334 Smoking (tobacco) complicating childbirth: Secondary | ICD-10-CM | POA: Diagnosis present

## 2016-04-18 DIAGNOSIS — E871 Hypo-osmolality and hyponatremia: Secondary | ICD-10-CM | POA: Diagnosis present

## 2016-04-18 DIAGNOSIS — Z6791 Unspecified blood type, Rh negative: Secondary | ICD-10-CM

## 2016-04-18 DIAGNOSIS — N12 Tubulo-interstitial nephritis, not specified as acute or chronic: Secondary | ICD-10-CM

## 2016-04-18 DIAGNOSIS — O9989 Other specified diseases and conditions complicating pregnancy, childbirth and the puerperium: Secondary | ICD-10-CM | POA: Diagnosis present

## 2016-04-18 HISTORY — PX: DILATION AND EVACUATION: SHX1459

## 2016-04-18 LAB — CBC WITH DIFFERENTIAL/PLATELET
BASOS ABS: 0 10*3/uL (ref 0–0.1)
BASOS PCT: 0 %
EOS PCT: 0 %
Eosinophils Absolute: 0 10*3/uL (ref 0–0.7)
HEMATOCRIT: 34.4 % — AB (ref 35.0–47.0)
Hemoglobin: 12.3 g/dL (ref 12.0–16.0)
LYMPHS PCT: 4 %
Lymphs Abs: 0.7 10*3/uL — ABNORMAL LOW (ref 1.0–3.6)
MCH: 32.2 pg (ref 26.0–34.0)
MCHC: 35.7 g/dL (ref 32.0–36.0)
MCV: 90.1 fL (ref 80.0–100.0)
MONO ABS: 1.1 10*3/uL — AB (ref 0.2–0.9)
Monocytes Relative: 6 %
NEUTROS ABS: 16.8 10*3/uL — AB (ref 1.4–6.5)
Neutrophils Relative %: 90 %
PLATELETS: 167 10*3/uL (ref 150–440)
RBC: 3.82 MIL/uL (ref 3.80–5.20)
RDW: 12.5 % (ref 11.5–14.5)
WBC: 18.6 10*3/uL — AB (ref 3.6–11.0)

## 2016-04-18 LAB — COMPREHENSIVE METABOLIC PANEL
ALBUMIN: 3.5 g/dL (ref 3.5–5.0)
ALK PHOS: 53 U/L (ref 38–126)
ALT: 21 U/L (ref 14–54)
ANION GAP: 10 (ref 5–15)
AST: 24 U/L (ref 15–41)
BILIRUBIN TOTAL: 0.4 mg/dL (ref 0.3–1.2)
BUN: 6 mg/dL (ref 6–20)
CALCIUM: 8.8 mg/dL — AB (ref 8.9–10.3)
CO2: 19 mmol/L — ABNORMAL LOW (ref 22–32)
Chloride: 103 mmol/L (ref 101–111)
Creatinine, Ser: 0.63 mg/dL (ref 0.44–1.00)
GLUCOSE: 113 mg/dL — AB (ref 65–99)
POTASSIUM: 3.5 mmol/L (ref 3.5–5.1)
Sodium: 132 mmol/L — ABNORMAL LOW (ref 135–145)
TOTAL PROTEIN: 6.6 g/dL (ref 6.5–8.1)

## 2016-04-18 LAB — URINALYSIS COMPLETE WITH MICROSCOPIC (ARMC ONLY)
BILIRUBIN URINE: NEGATIVE
GLUCOSE, UA: NEGATIVE mg/dL
NITRITE: NEGATIVE
PH: 6 (ref 5.0–8.0)
Protein, ur: 30 mg/dL — AB
SPECIFIC GRAVITY, URINE: 1.021 (ref 1.005–1.030)

## 2016-04-18 LAB — WET PREP, GENITAL
Clue Cells Wet Prep HPF POC: NONE SEEN
SPERM: NONE SEEN
Trich, Wet Prep: NONE SEEN
Yeast Wet Prep HPF POC: NONE SEEN

## 2016-04-18 LAB — LACTIC ACID, PLASMA: LACTIC ACID, VENOUS: 1.5 mmol/L (ref 0.5–1.9)

## 2016-04-18 LAB — CHLAMYDIA/NGC RT PCR (ARMC ONLY)
Chlamydia Tr: NOT DETECTED
N gonorrhoeae: NOT DETECTED

## 2016-04-18 LAB — HCG, QUANTITATIVE, PREGNANCY: hCG, Beta Chain, Quant, S: 39178 m[IU]/mL — ABNORMAL HIGH (ref <5)

## 2016-04-18 SURGERY — DILATION AND EVACUATION, UTERUS
Anesthesia: General

## 2016-04-18 MED ORDER — CLINDAMYCIN PHOSPHATE 900 MG/50ML IV SOLN
900.0000 mg | Freq: Once | INTRAVENOUS | Status: AC
  Administered 2016-04-18: 900 mg via INTRAVENOUS
  Filled 2016-04-18: qty 50

## 2016-04-18 MED ORDER — NICOTINE 21 MG/24HR TD PT24
21.0000 mg | MEDICATED_PATCH | Freq: Every day | TRANSDERMAL | Status: DC
  Administered 2016-04-19: 21 mg via TRANSDERMAL
  Filled 2016-04-18 (×2): qty 1

## 2016-04-18 MED ORDER — MAGNESIUM CITRATE PO SOLN
1.0000 | Freq: Once | ORAL | Status: DC | PRN
  Filled 2016-04-18: qty 296

## 2016-04-18 MED ORDER — SENNA 8.6 MG PO TABS
1.0000 | ORAL_TABLET | Freq: Two times a day (BID) | ORAL | Status: DC
  Administered 2016-04-19 (×2): 8.6 mg via ORAL
  Filled 2016-04-18 (×2): qty 1

## 2016-04-18 MED ORDER — SUCCINYLCHOLINE CHLORIDE 20 MG/ML IJ SOLN
INTRAMUSCULAR | Status: DC | PRN
  Administered 2016-04-18: 60 mg via INTRAVENOUS

## 2016-04-18 MED ORDER — ONDANSETRON HCL 4 MG PO TABS: 4.0000 mg | ORAL_TABLET | Freq: Four times a day (QID) | ORAL | Status: DC | PRN

## 2016-04-18 MED ORDER — MIDAZOLAM HCL 2 MG/2ML IJ SOLN
INTRAMUSCULAR | Status: DC | PRN
  Administered 2016-04-18: 2 mg via INTRAVENOUS

## 2016-04-18 MED ORDER — CLINDAMYCIN PHOSPHATE 900 MG/50ML IV SOLN
900.0000 mg | Freq: Three times a day (TID) | INTRAVENOUS | Status: DC
  Administered 2016-04-19 (×3): 900 mg via INTRAVENOUS
  Filled 2016-04-18 (×5): qty 50

## 2016-04-18 MED ORDER — SENNA 8.6 MG PO TABS: 2.0000 | ORAL_TABLET | Freq: Every day | ORAL | Status: DC

## 2016-04-18 MED ORDER — ACETAMINOPHEN 325 MG PO TABS
325.0000 mg | ORAL_TABLET | Freq: Once | ORAL | Status: AC
  Administered 2016-04-18: 325 mg via ORAL
  Filled 2016-04-18: qty 1

## 2016-04-18 MED ORDER — DEXTROSE 5 % IV SOLN: 1.0000 g | Freq: Once | INTRAVENOUS | Status: DC

## 2016-04-18 MED ORDER — GENTAMICIN SULFATE 40 MG/ML IJ SOLN
80.0000 mg | Freq: Three times a day (TID) | INTRAVENOUS | Status: DC
  Administered 2016-04-19 (×2): 80 mg via INTRAVENOUS
  Filled 2016-04-18 (×5): qty 2

## 2016-04-18 MED ORDER — ACETAMINOPHEN 325 MG PO TABS: 650.0000 mg | ORAL_TABLET | Freq: Four times a day (QID) | ORAL | Status: DC | PRN

## 2016-04-18 MED ORDER — ENOXAPARIN SODIUM 40 MG/0.4ML ~~LOC~~ SOLN
40.0000 mg | SUBCUTANEOUS | Status: DC
  Administered 2016-04-19: 40 mg via SUBCUTANEOUS
  Filled 2016-04-18: qty 0.4

## 2016-04-18 MED ORDER — GENTAMICIN SULFATE 40 MG/ML IJ SOLN
1.5000 mg/kg | Freq: Once | INTRAVENOUS | Status: AC
  Administered 2016-04-18: 80 mg via INTRAVENOUS
  Filled 2016-04-18: qty 2

## 2016-04-18 MED ORDER — LACTATED RINGERS IV SOLN
INTRAVENOUS | Status: DC | PRN
  Administered 2016-04-18: 21:00:00 via INTRAVENOUS

## 2016-04-18 MED ORDER — DEXTROSE 5 % IV SOLN: 1.0000 g | INTRAVENOUS | Status: DC

## 2016-04-18 MED ORDER — METOPROLOL TARTRATE 25 MG PO TABS
25.0000 mg | ORAL_TABLET | Freq: Two times a day (BID) | ORAL | Status: DC
  Filled 2016-04-18: qty 1

## 2016-04-18 MED ORDER — HYDROMORPHONE HCL 1 MG/ML IJ SOLN: 0.2000 mg | INTRAMUSCULAR | Status: DC | PRN

## 2016-04-18 MED ORDER — OXYCODONE HCL 5 MG/5ML PO SOLN: 5.0000 mg | Freq: Once | ORAL | Status: DC | PRN

## 2016-04-18 MED ORDER — FENTANYL CITRATE (PF) 100 MCG/2ML IJ SOLN
25.0000 ug | INTRAMUSCULAR | Status: DC | PRN
  Administered 2016-04-18 (×4): 25 ug via INTRAVENOUS

## 2016-04-18 MED ORDER — FENTANYL CITRATE (PF) 100 MCG/2ML IJ SOLN
INTRAMUSCULAR | Status: AC
  Filled 2016-04-18: qty 2

## 2016-04-18 MED ORDER — DOCUSATE SODIUM 100 MG PO CAPS
100.0000 mg | ORAL_CAPSULE | Freq: Two times a day (BID) | ORAL | Status: DC
  Administered 2016-04-19 (×2): 100 mg via ORAL
  Filled 2016-04-18 (×2): qty 1

## 2016-04-18 MED ORDER — SODIUM CHLORIDE 0.9 % IV BOLUS (SEPSIS)
1000.0000 mL | Freq: Once | INTRAVENOUS | Status: AC
  Administered 2016-04-18: 1000 mL via INTRAVENOUS

## 2016-04-18 MED ORDER — HYDROCODONE-ACETAMINOPHEN 5-325 MG PO TABS: 1.0000 | ORAL_TABLET | ORAL | Status: DC | PRN

## 2016-04-18 MED ORDER — POTASSIUM CHLORIDE IN NACL 20-0.9 MEQ/L-% IV SOLN
INTRAVENOUS | Status: DC
  Filled 2016-04-18 (×3): qty 1000

## 2016-04-18 MED ORDER — OXYCODONE HCL 5 MG PO TABS: 5.0000 mg | ORAL_TABLET | Freq: Once | ORAL | Status: DC | PRN

## 2016-04-18 MED ORDER — LACTATED RINGERS IV SOLN
INTRAVENOUS | Status: DC
  Administered 2016-04-18: 20:00:00 via INTRAVENOUS

## 2016-04-18 MED ORDER — CEFTRIAXONE SODIUM-DEXTROSE 1-3.74 GM-% IV SOLR
1.0000 g | Freq: Once | INTRAVENOUS | Status: AC
  Administered 2016-04-18: 1 g via INTRAVENOUS
  Filled 2016-04-18: qty 50

## 2016-04-18 MED ORDER — SODIUM CHLORIDE 0.9% FLUSH
3.0000 mL | Freq: Two times a day (BID) | INTRAVENOUS | Status: DC
  Administered 2016-04-19: 3 mL via INTRAVENOUS

## 2016-04-18 MED ORDER — ACETAMINOPHEN 650 MG RE SUPP: 650.0000 mg | Freq: Four times a day (QID) | RECTAL | Status: DC | PRN

## 2016-04-18 MED ORDER — ACETAMINOPHEN 325 MG PO TABS
650.0000 mg | ORAL_TABLET | Freq: Four times a day (QID) | ORAL | Status: DC | PRN
  Administered 2016-04-18 – 2016-04-20 (×2): 650 mg via ORAL
  Filled 2016-04-18 (×2): qty 2

## 2016-04-18 MED ORDER — IBUPROFEN 600 MG PO TABS
600.0000 mg | ORAL_TABLET | Freq: Four times a day (QID) | ORAL | Status: DC | PRN
  Filled 2016-04-18: qty 1

## 2016-04-18 MED ORDER — ZOLPIDEM TARTRATE 5 MG PO TABS: 5.0000 mg | ORAL_TABLET | Freq: Every evening | ORAL | Status: DC | PRN

## 2016-04-18 MED ORDER — NICOTINE POLACRILEX 2 MG MT GUM
2.0000 mg | CHEWING_GUM | OROMUCOSAL | Status: DC | PRN
  Filled 2016-04-18: qty 1

## 2016-04-18 MED ORDER — ONDANSETRON HCL 4 MG/2ML IJ SOLN: 4.0000 mg | Freq: Four times a day (QID) | INTRAMUSCULAR | Status: DC | PRN

## 2016-04-18 MED ORDER — SODIUM CHLORIDE 0.9 % IV SOLN
INTRAVENOUS | Status: DC
  Administered 2016-04-18 – 2016-04-19 (×4): via INTRAVENOUS

## 2016-04-18 MED ORDER — CEFTRIAXONE SODIUM-DEXTROSE 1-3.74 GM-% IV SOLR: 1.0000 g | INTRAVENOUS | Status: DC

## 2016-04-18 MED ORDER — PROPOFOL 10 MG/ML IV BOLUS
INTRAVENOUS | Status: DC | PRN
  Administered 2016-04-18: 80 mg via INTRAVENOUS

## 2016-04-18 MED ORDER — OXYCODONE-ACETAMINOPHEN 5-325 MG PO TABS
1.0000 | ORAL_TABLET | ORAL | Status: DC | PRN
  Administered 2016-04-19 (×5): 1 via ORAL
  Filled 2016-04-18 (×5): qty 1

## 2016-04-18 MED ORDER — ONDANSETRON HCL 4 MG/2ML IJ SOLN
4.0000 mg | Freq: Four times a day (QID) | INTRAMUSCULAR | Status: DC | PRN
  Administered 2016-04-19 – 2016-04-20 (×2): 4 mg via INTRAVENOUS
  Filled 2016-04-18 (×2): qty 2

## 2016-04-18 MED ORDER — MAGNESIUM HYDROXIDE 400 MG/5ML PO SUSP: 30.0000 mL | Freq: Every day | ORAL | Status: DC | PRN

## 2016-04-18 MED ORDER — FENTANYL CITRATE (PF) 100 MCG/2ML IJ SOLN
INTRAMUSCULAR | Status: DC | PRN
  Administered 2016-04-18: 100 ug via INTRAVENOUS
  Administered 2016-04-18: 50 ug via INTRAVENOUS

## 2016-04-18 SURGICAL SUPPLY — 27 items
CATH ROBINSON RED A/P 16FR (CATHETERS) ×3 IMPLANT
DRSG TELFA 3X8 NADH (GAUZE/BANDAGES/DRESSINGS) ×3 IMPLANT
FILTER UTR ASPR SPEC (MISCELLANEOUS) ×1 IMPLANT
FLTR UTR ASPR SPEC (MISCELLANEOUS) ×3
GLOVE BIO SURGEON STRL SZ 6.5 (GLOVE) ×3 IMPLANT
GLOVE BIO SURGEONS STRL SZ 6.5 (GLOVE) ×2
GLOVE INDICATOR 7.0 STRL GRN (GLOVE) ×5 IMPLANT
GOWN STRL REUS W/ TWL LRG LVL3 (GOWN DISPOSABLE) ×1 IMPLANT
GOWN STRL REUS W/TWL LRG LVL3 (GOWN DISPOSABLE) ×6
KIT BERKELEY 1ST TRIMESTER 3/8 (MISCELLANEOUS) ×3 IMPLANT
KIT RM TURNOVER STRD PROC AR (KITS) ×3 IMPLANT
NDL HYPO 25X1 1.5 SAFETY (NEEDLE) ×1 IMPLANT
NDL SPNL 25GX3.5 QUINCKE BL (NEEDLE) ×1 IMPLANT
NEEDLE HYPO 25X1 1.5 SAFETY (NEEDLE) IMPLANT
NEEDLE SPNL 25GX3.5 QUINCKE BL (NEEDLE) ×3 IMPLANT
PACK DNC HYST (MISCELLANEOUS) ×3 IMPLANT
PAD DRESSING TELFA 3X8 NADH (GAUZE/BANDAGES/DRESSINGS) ×1 IMPLANT
PAD OB MATERNITY 4.3X12.25 (PERSONAL CARE ITEMS) ×3 IMPLANT
PAD PREP 24X41 OB/GYN DISP (PERSONAL CARE ITEMS) ×3 IMPLANT
SET BERKELEY SUCTION TUBING (SUCTIONS) ×3 IMPLANT
SOL PREP PVP 2OZ (MISCELLANEOUS) ×3
SOLUTION PREP PVP 2OZ (MISCELLANEOUS) ×1 IMPLANT
SPONGE XRAY 4X4 16PLY STRL (MISCELLANEOUS) ×3 IMPLANT
SYRINGE 10CC LL (SYRINGE) ×3 IMPLANT
VACURETTE 10 RIGID CVD (CANNULA) ×1 IMPLANT
VACURETTE 12 RIGID CVD (CANNULA) ×3 IMPLANT
VACURETTE 8 RIGID CVD (CANNULA) ×1 IMPLANT

## 2016-04-18 NOTE — ED Provider Notes (Addendum)
[facility] Emergency Department Provider Note ____________________________________________   I have reviewed the triage vital signs and the triage nursing note.  HISTORY  Chief Complaint Fever   Historian Patient  HPI Monique Underwood is a 20 y.o. female who is here for body aches which she describes as joint aches mostly in both hips but also upper extremities as well for 3 or 4 days. She is [redacted] weeks pregnant, and reports having had an ultrasound at 5 weeks, no heartbeat seen yet at that point in time. She had one episode of slight bleeding vaginally during September, but was told it was okay by OB and did not have further evaluation at that point in time. She denies recent as per respiratory symptoms, no vomiting or diarrhea or abdominal pain. She states that both hips are very sore and she took one 325 mg acetaminophen tablets this morning. No skin rash. No swelling. No trouble breathing. No chest pain.    Past Medical History:  Diagnosis Date  . Anxiety   . EDS (Ehlers-Danlos syndrome)    EDS 3  . Migraine with aura   . Ovarian cyst   . Rotavirus infection 1/25 - 07/22/02   Hospitalized  . Syncope     Patient Active Problem List   Diagnosis Date Noted  . Acute pyelonephritis 04/18/2016  . Sepsis (HCC) 04/18/2016  . Nonviable pregnancy 04/18/2016  . SVT (supraventricular tachycardia) (HCC) 01/14/2015  . Racing heart beat 12/17/2014  . Syncope 12/11/2014  . Anxiety state 12/11/2014  . Routine general medical examination at a health care facility 01/17/2013  . ADD (attention deficit disorder) 06/24/2012  . FEBRILE SEIZURE, PROLONGED 10/29/2009    Past Surgical History:  Procedure Laterality Date  . 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 Febrile seizure  7/99   [facility] - w/u negative  . MRI - head     NML    Prior to Admission medications   Medication Sig Start Date End Date Taking? Authorizing Provider  acyclovir (ZOVIRAX) 400 MG tablet TAKE 1 TABLET  (400 MG TOTAL) BY MOUTH QD PRN. 06/15/15  Yes Joaquim Nam, MD  ondansetron (ZOFRAN ODT) 4 MG disintegrating tablet Take 1 tablet (4 mg total) by mouth every 8 (eight) hours as needed for nausea or vomiting. 03/07/16  Yes Melody N Shambley, CNM  Prenat w/o A FeCbnFeGlu-FA &B6 (CITRANATAL B-CALM) 20-1 & 25 (2) MG MISC Take 20 mg by mouth daily. 04/06/16  Yes Hildred Laser, MD  metoprolol tartrate (LOPRESSOR) 25 MG tablet Take 1 tablet (25 mg total) by mouth 2 (two) times daily. Patient not taking: Reported on 03/17/2016 03/07/16   Melody Suzan Nailer, CNM    Allergies  Allergen Reactions  . Shellfish Allergy Anaphylaxis and Hives  . Lexapro [Escitalopram Oxalate] Other (See Comments)    GI upset.     Family History  Problem Relation Age of Onset  . Thyroid disease Mother   . Diabetes Other   . Cancer Other     Social History Social History  Substance Use Topics  . Smoking status: Current Every Day Smoker    Packs/day: 0.25    Years: 3.00    Types: Cigarettes  . Smokeless tobacco: Never Used     Comment: Parents doesn't know she smokes!!!!!  . Alcohol use No    Review of Systems  Constitutional: She didn't know she had a fever until triage Eyes: Negative for visual changes. ENT: Negative for sore throat or  nasal drainage. Cardiovascular: Negative for chest pain. Respiratory: Negative for shortness of breath or cough. Gastrointestinal: Negative for vomiting and diarrhea. Genitourinary: Negative for dysuria. Musculoskeletal: Negative for back pain.  Aches in both hips and other joints, wrists, ankles. Skin: Negative for rash. Neurological: Negative for headache. 10 point Review of Systems otherwise negative ____________________________________________   PHYSICAL EXAM:  VITAL SIGNS: ED Triage Vitals  Enc Vitals Group     BP 04/18/16 0758 (!) 106/51     Pulse Rate 04/18/16 0758 (!) 123     Resp 04/18/16 0758 18     Temp 04/18/16 0758 (!) 100.9 F (38.3 C)     Temp  Source 04/18/16 0758 Oral     SpO2 04/18/16 0758 97 %     Weight 04/18/16 0749 116 lb (52.6 kg)     Height 04/18/16 0749 5\' 8"  (1.727 m)     Head Circumference --      Peak Flow --      Pain Score 04/18/16 0749 5     Pain Loc --      Pain Edu? --      Excl. in GC? --      Constitutional: Alert and oriented. Well appearing and in no distress. HEENT   Head: Normocephalic and atraumatic.      Eyes: Conjunctivae are normal. PERRL. Normal extraocular movements.      Ears:         Nose: No congestion/rhinnorhea.   Mouth/Throat: Mucous membranes are moderately dry.   Neck: No stridor. Cardiovascular/Chest: Tachycardic, regular rhythm.  No murmurs, rubs, or gallops. Respiratory: Normal respiratory effort without tachypnea nor retractions. Breath sounds are clear and equal bilaterally. No wheezes/rales/rhonchi. Gastrointestinal: Soft. No distention, no guarding, no rebound. Very mild discomfort suprapubically. Genitourinary/rectal:Deferred Musculoskeletal: Reports aches in joints, shoulder, wrists, hips ankles bilaterally, but no swelling, stiffness appreciated, redness.  No joint effusions.  No calf swelling or tenderness.  No edema. Neurologic:  Normal speech and language. No gross or focal neurologic deficits are appreciated. Skin:  Skin is warm, dry and intact. No rash noted. Psychiatric: Mood and affect are normal. Speech and behavior are normal. Patient exhibits appropriate insight and judgment.   ____________________________________________  LABS (pertinent positives/negatives)  Labs Reviewed  COMPREHENSIVE METABOLIC PANEL - Abnormal; Notable for the following:       Result Value   Sodium 132 (*)    CO2 19 (*)    Glucose, Bld 113 (*)    Calcium 8.8 (*)    All other components within normal limits  CBC WITH DIFFERENTIAL/PLATELET - Abnormal; Notable for the following:    WBC 18.6 (*)    HCT 34.4 (*)    Neutro Abs 16.8 (*)    Lymphs Abs 0.7 (*)    Monocytes  Absolute 1.1 (*)    All other components within normal limits  URINALYSIS COMPLETEWITH MICROSCOPIC ([facility] ONLY) - Abnormal; Notable for the following:    Color, Urine YELLOW (*)    APPearance CLOUDY (*)    Ketones, ur 2+ (*)    Hgb urine dipstick 3+ (*)    Protein, ur 30 (*)    Leukocytes, UA 3+ (*)    Bacteria, UA RARE (*)    Squamous Epithelial / LPF 6-30 (*)    All other components within normal limits  HCG, QUANTITATIVE, PREGNANCY - Abnormal; Notable for the following:    hCG, Beta Chain, Quant, S 39,178 (*)    All other components within normal limits  URINE CULTURE  CULTURE, BLOOD (ROUTINE X 2)  CULTURE, BLOOD (ROUTINE X 2)  CHLAMYDIA/NGC RT PCR ([facility] ONLY)  WET PREP, GENITAL  LACTIC ACID, PLASMA  LACTIC ACID, PLASMA    ____________________________________________    EKG I, Governor Rooksebecca Shaunna Rosetti, MD, the attending physician have personally viewed and interpreted all ECGs.  113 bpm. Sinus tachycardia.Respiratory normal axis. Normal ST and T-wave ____________________________________________  RADIOLOGY All Xrays were viewed by me. Imaging interpreted by Radiologist.  US pelvis and transvaginal:  IMPRESSION: Single intrauterine gestation without cardiac activity, as described above.  Findings meet definitive criteria for failed pregnancy. This follows SRU consensus guidelines: Diagnostic Criteria for Nonviable Pregnancy Early in the First Trimester. Macy MisN Engl J Med [phone];[id number]. __________________________________________  PROCEDURES  Procedure(s) performed: None  Critical Care performed: None  ____________________________________________   ED COURSE / ASSESSMENT AND PLAN  Pertinent labs & imaging results that were available during my care of the patient were reviewed by me and considered in my medical decision making (see chart for details).   Ms. Hyacinth MeekerMiller is here for aches which she describes in multiple joints, but on exam no joint effusions or  redness or stiffness with full range of motion. She was found to have a low-grade temperature here, and she did have Tylenol earlier today.  No clear symptoms for source of fever including no upper respiratory symptoms, or GI symptoms. She does have some discomfort suprapubically, I will send a urine.  It sounds like she has not had confirmed IUP, and I will send her for ultrasound.  We discussed that joint inflammation may be secondary to a virus as a most likely cause however she is going to need close follow-up for autoimmune disease with her primary care doctor.   2pm reevaluation, patient has urinary tract infection also elevated white blood cell count and tachycardia with fever, concern for sepsis due to pyelonephritis. She'll be given a second liter normal saline, Rocephin, after blood cultures. Lactate will be sent. Lactate pending upon consultations the hospitalist, Dr. Zorita PangVaikute.  Patient did drop her blood pressure to 90s over 50s, but patient states that she has a history of blood pressure that runs in the 90s over 60s due to [employer] and Pots.  Ultrasound shows failed IUP, no heartbeat at 12 weeks size. I did discuss this with the patient and her boyfriend and her mom. She will need follow-up with OB/GYN, no emergency indication for OB/GYN here in the emergency department. No evidence on ultrasound for ectopic pregnancy.  Patient denied any unusual vaginal discharge or vaginal bleeding, but I did decide to go ahead and do a pelvic exam. On pelvic exam she does have cervical motion tenderness and somewhat foul vaginal discharge, and I am going to also cover with antibiotic for pelvic inflammatory disease. I spoke with patient's OB/GYN Dr. Valentino Saxonherry, who will admit.    CONSULTATIONS:   Dr. Zorita PangVaikute, hospitalist for admission initially, but then consultation because with evidence of PID/septic abortion, will be admitted to Dr. Valentino Saxonherry, [facility] - recommends gent and  clinda.   Patient / Family / Caregiver informed of clinical course, medical decision-making process, and agree with plan.     ___________________________________________   FINAL CLINICAL IMPRESSION(S) / ED DIAGNOSES   Final diagnoses:  Pyelonephritis  PID (acute pelvic inflammatory disease)  Missed abortion  Sepsis, due to unspecified organism [facility](HCC)              Note: This dictation was prepared with Nurse, children'sDragon dictation. Any transcriptional errors that result from  this process are unintentional    Governor Rooks, MD 04/18/16 1521    Governor Rooks, MD 04/18/16 1556

## 2016-04-18 NOTE — OR Nursing (Signed)
In 7 OUT straight catheter by Dr. Valentino Saxon  clear yellow urine .

## 2016-04-18 NOTE — ED Triage Notes (Signed)
Reports [redacted] wks pregnant.  Fever and bodyaches

## 2016-04-18 NOTE — ED Notes (Signed)
Pt is 12 weeks preg  Was told to stop her metoprolol and that she was a high risk .Marland Kitchen

## 2016-04-18 NOTE — Transfer of Care (Signed)
Immediate Anesthesia Transfer of Care Note  Patient: Monique Underwood  Procedure(s) Performed: Procedure(s): DILATATION AND EVACUATION (N/A)  Patient Location: PACU  Anesthesia Type:General  Level of Consciousness: awake, alert , oriented and patient cooperative  Airway & Oxygen Therapy: Patient Spontanous Breathing and Patient connected to face mask oxygen  Post-op Assessment: Report given to RN and Post -op Vital signs reviewed and stable  Post vital signs: Reviewed and stable  Last Vitals:  Vitals:   04/18/16 1725 04/18/16 1928  BP: 101/60 (!) 100/47  Pulse: (!) 115 (!) 122  Resp: 18 19  Temp: 37.7 C (!) 39.2 C    Last Pain:  Vitals:   04/18/16 2006  TempSrc:   PainSc: 9       Patients Stated Pain Goal: 0 (04/18/16 2006)  Complications: No apparent anesthesia complications

## 2016-04-18 NOTE — Anesthesia Procedure Notes (Signed)
Procedure Name: Intubation Date/Time: 04/18/2016 8:56 PM Performed by: Waldo Laine Pre-anesthesia Checklist: Patient identified and Emergency Drugs available Patient Re-evaluated:Patient Re-evaluated prior to inductionOxygen Delivery Method: Circle system utilized Preoxygenation: Pre-oxygenation with 100% oxygen Intubation Type: IV induction Laryngoscope Size: Miller and 2 Grade View: Grade I Tube type: Oral Tube size: 7.0 mm Number of attempts: 1 Airway Equipment and Method: Stylet Placement Confirmation: ETT inserted through vocal cords under direct vision,  positive ETCO2 and breath sounds checked- equal and bilateral Secured at: 21 cm Tube secured with: Tape Dental Injury: Teeth and Oropharynx as per pre-operative assessment

## 2016-04-18 NOTE — H&P (Deleted)
[facility] - [place] [facility]   PATIENT NAME: Monique Underwood    MR#:  [id number]  DATE OF BIRTH:  25-Aug-1995  DATE OF ADMISSION:  04/18/2016  PRIMARY CARE PHYSICIAN: Crawford Givens, MD   REQUESTING/REFERRING PHYSICIAN:   CHIEF COMPLAINT:   Chief Complaint  Patient presents with  . Fever    HISTORY OF PRESENT ILLNESS: Monique Underwood  is a 20 y.o. female with a known history of Anxiety, EDS 3 syndrome, migraine headaches, ovarian cyst, recent diagnosis of pregnancy/12 weeks, who presents to the hospital with complaints of fevers and chills since yesterday, joint pains, bilateral flank pains. On arrival to the hospital patient was noted to be febrile with temperature of 101.1, tachycardic with heart rate above 120, hypotensive with systolic blood pressure of 90. Labs revealed hyponatremia, elevated white blood cell count, patient's hCG was high at 39,000, however, ultrasound revealed single. He indurated and gestation without cardiac activity, can still persistent with failed nonviable pregnancy. Hospitalist services were contacted for admission PAST MEDICAL HISTORY:   Past Medical History:  Diagnosis Date  . Anxiety   . EDS (Ehlers-Danlos syndrome)    EDS 3  . Migraine with aura   . Ovarian cyst   . Rotavirus infection 1/25 - 07/22/02   Hospitalized  . Syncope     PAST SURGICAL HISTORY: Past Surgical History:  Procedure Laterality Date  . 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 Febrile seizure  7/99   [facility] - w/u negative  . MRI - head     NML    SOCIAL HISTORY:  Social History  Substance Use Topics  . Smoking status: Current Every Day Smoker    Packs/day: 0.25    Years: 3.00    Types: Cigarettes  . Smokeless tobacco: Never Used     Comment: Parents doesn't know she smokes!!!!!  . Alcohol use No    FAMILY HISTORY:  Family History  Problem Relation Age of Onset  . Thyroid disease Mother   . Diabetes Other   . Cancer Other     DRUG ALLERGIES:   Allergies  Allergen Reactions  . Shellfish Allergy Anaphylaxis and Hives  . Lexapro [Escitalopram Oxalate] Other (See Comments)    GI upset.     Review of Systems  Constitutional: Positive for chills, fever and malaise/fatigue. Negative for weight loss.  HENT: Positive for nosebleeds. Negative for congestion.   Eyes: Negative for blurred vision and double vision.  Respiratory: Negative for cough, sputum production, shortness of breath and wheezing.   Cardiovascular: Positive for palpitations. Negative for chest pain, orthopnea, leg swelling and PND.  Gastrointestinal: Positive for abdominal pain, nausea and vomiting. Negative for blood in stool, constipation and diarrhea.  Genitourinary: Positive for flank pain. Negative for dysuria, frequency, hematuria and urgency.  Musculoskeletal: Positive for back pain and joint pain. Negative for falls.  Neurological: Negative for dizziness, tremors, focal weakness and headaches.  Endo/Heme/Allergies: Does not bruise/bleed easily.  Psychiatric/Behavioral: Negative for depression. The patient does not have insomnia.     MEDICATIONS AT HOME:  Prior to Admission medications   Medication Sig Start Date End Date Taking? Authorizing Provider  acyclovir (ZOVIRAX) 400 MG tablet TAKE 1 TABLET (400 MG TOTAL) BY MOUTH QD PRN. 06/15/15  Yes Joaquim Nam, MD  ondansetron (ZOFRAN ODT) 4 MG disintegrating tablet Take 1 tablet (4 mg total) by mouth every 8 (eight) hours as needed for nausea or vomiting. 03/07/16  Yes Melody Suzan Nailer, CNM  Prenat w/o A FeCbnFeGlu-FA &B6 (CITRANATAL B-CALM) 20-1 & 25 (2) MG MISC Take 20 mg by mouth daily. 04/06/16  Yes Hildred Laser, MD  metoprolol tartrate (LOPRESSOR) 25 MG tablet Take 1 tablet (25 mg total) by mouth 2 (two) times daily. Patient not taking: Reported on 03/17/2016 03/07/16   Melody N Shambley, CNM      PHYSICAL EXAMINATION:   VITAL SIGNS: Blood pressure (!) 90/44, pulse (!) 120, temperature 100.1 F (37.8  C), temperature source Oral, resp. rate (!) 24, height 5\' 8"  (1.727 m), weight 52.6 kg (116 lb), last menstrual period 01/20/2016, SpO2 97 %.  GENERAL:  20 y.o.-year-old patient lying in the bed In moderate distress, crying.  EYES: Pupils equal, round, reactive to light and accommodation. No scleral icterus. Extraocular muscles intact.  HEENT: Head atraumatic, normocephalic. Oropharynx and nasopharynx clear.  NECK:  Supple, no jugular venous distention. No thyroid enlargement, no tenderness.  LUNGS: Normal breath sounds bilaterally, no wheezing, rales,rhonchi or crepitation. No use of accessory muscles of respiration.  CARDIOVASCULAR: S1, S2 , tachycardic No murmurs, rubs, or gallops.  ABDOMEN: Soft,  mildly tender suprapubically, but no rebound or guarding was noted, nondistended. Bowel sounds present. No organomegaly or mass.  Mild CVA tenderness bilaterally on percussion EXTREMITIES: No pedal edema, cyanosis, or clubbing.  NEUROLOGIC: Cranial nerves II through XII are intact. Muscle strength 5/5 in all extremities. Sensation intact. Gait not checked.  PSYCHIATRIC: The patient is alert and oriented x 3.  Emotional, crying after learning about nonviable pregnancy SKIN: No obvious rash, lesion, or ulcer.   LABORATORY PANEL:   CBC  Recent Labs Lab 04/18/16 0854  WBC 18.6*  HGB 12.3  HCT 34.4*  PLT 167  MCV 90.1  MCH 32.2  MCHC 35.7  RDW 12.5  LYMPHSABS 0.7*  MONOABS 1.1*  EOSABS 0.0  BASOSABS 0.0   ------------------------------------------------------------------------------------------------------------------  Chemistries   Recent Labs Lab 04/18/16 0854  NA 132*  K 3.5  CL 103  CO2 19*  GLUCOSE 113*  BUN 6  CREATININE 0.63  CALCIUM 8.8*  AST 24  ALT 21  ALKPHOS 53  BILITOT 0.4   ------------------------------------------------------------------------------------------------------------------  Cardiac Enzymes No results for input(s): TROPONINI in the last 168  hours. ------------------------------------------------------------------------------------------------------------------  RADIOLOGY: US Ob Comp Less 14 Wks  Result Date: 04/18/2016 CLINICAL DATA:  Pregnant, suprapubic abdominal pain x2 days EXAM: OBSTETRIC <14 WK ULTRASOUND TECHNIQUE: Transabdominal ultrasound was performed for evaluation of the gestation as well as the maternal uterus and adnexal regions. COMPARISON:  None. FINDINGS: Intrauterine gestational sac: Single Yolk sac:  Not visualized Embryo:  Present Cardiac Activity: Not Visualized. CRL:   53.7  mm   12 w 0 d Subchorionic hemorrhage:  None visualized. Maternal uterus/adnexae: Bilateral ovaries are within normal limits. No free fluid. IMPRESSION: Single intrauterine gestation without cardiac activity, as described above. Findings meet definitive criteria for failed pregnancy. This follows SRU consensus guidelines: Diagnostic Criteria for Nonviable Pregnancy Early in the First Trimester. Macy Mis J Med [phone]. Electronically Signed   By: Charline Bills M.D.   On: 04/18/2016 09:50    EKG: Orders placed or performed in visit on 07/15/15  . EKG 12-Lead    IMPRESSION AND PLAN:  Principal Problem:   Acute pyelonephritis Active Problems:   Sepsis (HCC)   Nonviable pregnancy #1. Acute pyelonephritis, admit the patient to the medical floor, get urinary, blood cultures, initiate her on the Rocephin intravenously   #2. Sepsis due to acute pyelonephritis, continue IV fluids at a high rate,  antibiotics, awaiting for cultures, adjust antibiotics according to the culture results #3. Hyponatremia, follow with IV fluid administration #4. Leukocytosis, follow with antibiotics #5 nonviable pregnancy, get Dr. Valentino Saxonherry to see patient in consultation #6. Tobacco abuse. Counseling, discussed this patient for 4 minutes, nicotine replacement therapy will be initiated, patient is agreeable  All the records are reviewed and case discussed  with ED provider. Management plans discussed with the patient, family and they are in agreement.  CODE STATUS: Code Status History    This patient does not have a recorded code status. Please follow your organizational policy for patients in this situation.       TOTAL TIME TAKING CARE OF THIS PATIENT: 50 minutes.    Katharina CaperVAICKUTE,Elainna Eshleman M.D on 04/18/2016 at 3:02 PM  Between 7am to 6pm - Pager - [phone] After 6pm go to [email or web address] - password [email or web address] [facility]  [facility]  Office  [phone]  CC: Primary care physician; Crawford GivensGraham Duncan, MD

## 2016-04-18 NOTE — Consult Note (Signed)
[facility] [place] [facility]   PATIENT NAME: Monique Underwood    MR#:  [id number]  DATE OF BIRTH:  1995-10-04  DATE OF ADMISSION:  04/18/2016  PRIMARY CARE PHYSICIAN: Crawford GivensGraham Duncan, MD   REQUESTING/REFERRING PHYSICIAN:   CHIEF COMPLAINT:      Chief Complaint  Patient presents with  . Fever    HISTORY OF PRESENT ILLNESS: Monique Underwood  is a 20 y.o. female with a known history of Anxiety, EDS 3 syndrome, migraine headaches, ovarian cyst, recent diagnosis of pregnancy/12 weeks, who presents to the hospital with complaints of fevers and chills since yesterday, joint pains, bilateral flank pains. On arrival to the hospital patient was noted to be febrile with temperature of 101.1, tachycardic with heart rate above 120, hypotensive with systolic blood pressure of 90. Labs revealed hyponatremia, elevated white blood cell count, patient's hCG was high at 39,000, however, ultrasound revealed single Intrauterine gestation without cardiac activity, can still persistent with failed nonviable pregnancy. Pelvic exam revealed pus on discharge. Hospitalist services were contacted initially for admission, later for consultation.  PAST MEDICAL HISTORY:       Past Medical History:  Diagnosis Date  . Anxiety   . EDS (Ehlers-Danlos syndrome)    EDS 3  . Migraine with aura   . Ovarian cyst   . Rotavirus infection 1/25 - 07/22/02   Hospitalized  . Syncope          PAST SURGICAL HISTORY: Past Surgical History:  Procedure Laterality Date  . 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 Febrile seizure  7/99   [facility] - w/u negative  . MRI - head     NML    SOCIAL HISTORY:        Social History  Substance Use Topics  . Smoking status: Current Every Day Smoker    Packs/day: 0.25    Years: 3.00    Types: Cigarettes  . Smokeless tobacco: Never Used     Comment: Parents doesn't know she smokes!!!!!  . Alcohol use No    FAMILY HISTORY:         Family History  Problem Relation Age of Onset  . Thyroid disease Mother   . Diabetes Other   . Cancer Other     DRUG ALLERGIES:       Allergies  Allergen Reactions  . Shellfish Allergy Anaphylaxis and Hives  . Lexapro [Escitalopram Oxalate] Other (See Comments)    GI upset.     Review of Systems  Constitutional: Positive for chills, fever and malaise/fatigue. Negative for weight loss.  HENT: Positive for nosebleeds. Negative for congestion.   Eyes: Negative for blurred vision and double vision.  Respiratory: Negative for cough, sputum production, shortness of breath and wheezing.   Cardiovascular: Positive for palpitations. Negative for chest pain, orthopnea, leg swelling and PND.  Gastrointestinal: Positive for abdominal pain, nausea and vomiting. Negative for blood in stool, constipation and diarrhea.  Genitourinary: Positive for flank pain. Negative for dysuria, frequency, hematuria and urgency.  Musculoskeletal: Positive for back pain and joint pain. Negative for falls.  Neurological: Negative for dizziness, tremors, focal weakness and headaches.  Endo/Heme/Allergies: Does not bruise/bleed easily.  Psychiatric/Behavioral: Negative for depression. The patient does not have insomnia.     MEDICATIONS AT HOME:         Prior to Admission medications   Medication Sig Start Date End Date Taking? Authorizing Provider  acyclovir (ZOVIRAX) 400 MG tablet TAKE 1 TABLET (400 MG  TOTAL) BY MOUTH QD PRN. 06/15/15  Yes Joaquim Nam, MD  ondansetron (ZOFRAN ODT) 4 MG disintegrating tablet Take 1 tablet (4 mg total) by mouth every 8 (eight) hours as needed for nausea or vomiting. 03/07/16  Yes Melody N Shambley, CNM  Prenat w/o A FeCbnFeGlu-FA &B6 (CITRANATAL B-CALM) 20-1 & 25 (2) MG MISC Take 20 mg by mouth daily. 04/06/16  Yes Hildred Laser, MD  metoprolol tartrate (LOPRESSOR) 25 MG tablet Take 1 tablet (25 mg total) by mouth 2 (two) times daily. Patient not taking: Reported  on 03/17/2016 03/07/16   Melody N Shambley, CNM      PHYSICAL EXAMINATION:   VITAL SIGNS: Blood pressure (!) 90/44, pulse (!) 120, temperature 100.1 F (37.8 C), temperature source Oral, resp. rate (!) 24, height 5\' 8"  (1.727 m), weight 52.6 kg (116 lb), last menstrual period 01/20/2016, SpO2 97 %.  GENERAL:  20 y.o.-year-old patient lying in the bed In moderate distress, crying.  EYES: Pupils equal, round, reactive to light and accommodation. No scleral icterus. Extraocular muscles intact.  HEENT: Head atraumatic, normocephalic. Oropharynx and nasopharynx clear.  NECK:  Supple, no jugular venous distention. No thyroid enlargement, no tenderness.  LUNGS: Normal breath sounds bilaterally, no wheezing, rales,rhonchi or crepitation. No use of accessory muscles of respiration.  CARDIOVASCULAR: S1, S2 , tachycardic No murmurs, rubs, or gallops.  ABDOMEN: Soft,  mildly tender suprapubically, but no rebound or guarding was noted, nondistended. Bowel sounds present. No organomegaly or mass.   minimal or none CVA tenderness bilaterally on percussion. Pelvic exam revealed pus or discharge, according to Dr. Shaune Pollack, emergency room physician EXTREMITIES: No pedal edema, cyanosis, or clubbing.  NEUROLOGIC: Cranial nerves II through XII are intact. Muscle strength 5/5 in all extremities. Sensation intact. Gait not checked.  PSYCHIATRIC: The patient is alert and oriented x 3.  Emotional, crying after learning about nonviable pregnancy SKIN: No obvious rash, lesion, or ulcer.   LABORATORY PANEL:   CBC  Last Labs    Recent Labs Lab 04/18/16 0854  WBC 18.6*  HGB 12.3  HCT 34.4*  PLT 167  MCV 90.1  MCH 32.2  MCHC 35.7  RDW 12.5  LYMPHSABS 0.7*  MONOABS 1.1*  EOSABS 0.0  BASOSABS 0.0     ------------------------------------------------------------------------------------------------------------------  Chemistries   Last Labs    Recent Labs Lab 04/18/16 0854  NA 132*  K 3.5    CL 103  CO2 19*  GLUCOSE 113*  BUN 6  CREATININE 0.63  CALCIUM 8.8*  AST 24  ALT 21  ALKPHOS 53  BILITOT 0.4     ------------------------------------------------------------------------------------------------------------------  Cardiac Enzymes Last Labs   No results for input(s): TROPONINI in the last 168 hours.   ------------------------------------------------------------------------------------------------------------------  RADIOLOGY:  Imaging Results (Last 48 hours)  US Ob Comp Less 14 Wks  Result Date: 04/18/2016 CLINICAL DATA:  Pregnant, suprapubic abdominal pain x2 days EXAM: OBSTETRIC <14 WK ULTRASOUND TECHNIQUE: Transabdominal ultrasound was performed for evaluation of the gestation as well as the maternal uterus and adnexal regions. COMPARISON:  None. FINDINGS: Intrauterine gestational sac: Single Yolk sac:  Not visualized Embryo:  Present Cardiac Activity: Not Visualized. CRL:   53.7  mm   12 w 0 d Subchorionic hemorrhage:  None visualized. Maternal uterus/adnexae: Bilateral ovaries are within normal limits. No free fluid. IMPRESSION: Single intrauterine gestation without cardiac activity, as described above. Findings meet definitive criteria for failed pregnancy. This follows SRU consensus guidelines: Diagnostic Criteria for Nonviable Pregnancy Early in the First Trimester. N Engl J  Med [phone]. Electronically Signed   By: Charline Bills M.D.   On: 04/18/2016 09:50     EKG:    Orders placed or performed in visit on 07/15/15  . EKG 12-Lead    IMPRESSION AND PLAN:  Principal Problem:   Acute pyelonephritis Active Problems:   Sepsis (HCC)   Nonviable pregnancy #1. Pelvic inflammatory disease, the patient will be admitted by OB/GYN to the medical floor for IV antibiotics, get urinary, blood cultures, initiate her on broad-spectrum antibiotic therapy  #2. Sepsis due to pelvic inflammatory disease, questionable urinary tract infection,  continue IV fluids at a high rate, antibiotics, awaiting for cultures, adjust antibiotics according to the culture results #3. Hyponatremia, follow with IV fluid administration #4. Leukocytosis, follow with antibiotics #5 nonviable pregnancy, Dr. Valentino Saxon will admit patient #6. Tobacco abuse. Counseling, discussed this patient for 4 minutes, nicotine replacement therapy will be initiated, patient is agreeable  All the records are reviewed and case discussed with ED provider. Management plans discussed with the patient, family and they are in agreement.  CODE STATUS:    Code Status History    This patient does not have a recorded code status. Please follow your organizational policy for patients in this situation.       TOTAL TIME TAKING CARE OF THIS PATIENT: 50 minutes.    Katharina Caper M.D on 04/18/2016 at 3:02 PM  Between 7am to 6pm - Pager - [phone] After 6pm go to [email or web address] - password [email or web address] [facility]  [facility]  Office  [phone]  CC: Primary care physician; Crawford Givens, MD      Routing History

## 2016-04-18 NOTE — Anesthesia Preprocedure Evaluation (Addendum)
Anesthesia Evaluation  Patient identified by MRN, date of birth, ID band Patient awake    Reviewed: Allergy & Precautions, H&P , NPO status , Patient's Chart, lab work & pertinent test results  History of Anesthesia Complications (+) Family history of anesthesia reaction and history of anesthetic complications  Airway Mallampati: II  TM Distance: >3 FB Neck ROM: full    Dental no notable dental hx. (+) Teeth Intact   Pulmonary neg shortness of breath, Current Smoker,    Pulmonary exam normal breath sounds clear to auscultation       Cardiovascular Exercise Tolerance: Good (-) angina(-) Past MI and (-) DOE Normal cardiovascular exam+ dysrhythmias Supra Ventricular Tachycardia + Valvular Problems/Murmurs MR  Rhythm:regular Rate:Normal     Neuro/Psych  Headaches, Seizures - (as a child),  PSYCHIATRIC DISORDERS Anxiety    GI/Hepatic negative GI ROS, Neg liver ROS, neg GERD  ,  Endo/Other  negative endocrine ROS  Renal/GU      Musculoskeletal   Abdominal   Peds  Hematology negative hematology ROS (+)   Anesthesia Other Findings Septic Abortion   Past Medical History: No date: Anxiety No date: EDS (Ehlers-Danlos syndrome)     Comment: EDS 3 No date: Migraine with aura No date: Ovarian cyst 1/25 - 07/22/02: Rotavirus infection     Comment: Hospitalized No date: Syncope  Past Surgical History: 7/99: EEG     Comment: NML 7/99: Febrile seizure     Comment: [facility] - w/u negative No date: MRI - head     Comment: NML  BMI    Body Mass Index:  18.26 kg/m      Reproductive/Obstetrics negative OB ROS                            Anesthesia Physical Anesthesia Plan  ASA: IV  Anesthesia Plan: General ETT   Post-op Pain Management:    Induction:   Airway Management Planned:   Additional Equipment:   Intra-op Plan:   Post-operative Plan:   Informed Consent: I have  reviewed the patients History and Physical, chart, labs and discussed the procedure including the risks, benefits and alternatives for the proposed anesthesia with the patient or authorized representative who has indicated his/her understanding and acceptance.     Plan Discussed with: Anesthesiologist, CRNA and Surgeon  Anesthesia Plan Comments:         Anesthesia Quick Evaluation

## 2016-04-18 NOTE — ED Notes (Signed)
Family at bedside. Patient's mother at bedside and attentive.

## 2016-04-18 NOTE — Op Note (Signed)
Procedure(s): DILATATION AND EVACUATION Procedure Note  Monique Underwood female 20 y.o. 04/18/2016  Indications: The patient is a 20 y.o. G1P0 female with suspected septic abortion at [redacted] weeks gestation  Pre-operative Diagnosis: 1) 12 week missed abortion, 2) sepsis, 3) Ehler Danlos syndrome  Post-operative Diagnosis: Same  Surgeon: Hildred Laser, MD  Assistants: None  Anesthesia: General endotracheal anesthesia  Findings: Manson Passey watery but slightly mucoid with small amount of pus-like discharge noted in vaginal vault prior to prep.  Small clot in vaginal vault.   The uterus was sounded to 11 cm. Tissue consistent with products of conception was removed and sent to Pathology.  Procedure Details: The patient was seen in the Holding Room. The risks, benefits, complications, treatment options, and expected outcomes were discussed with the patient.  The patient concurred with the proposed plan, giving informed consent.  The site of surgery properly noted/marked. The patient was taken to the Operating Room, identified as [name] and the procedure verified as Procedure(s) (LRB): DILATATION AND EVACUATION (N/A).   She was then placed under general anesthesia without difficulty. She was placed in the dorsal lithotomy position, and was prepped and draped in a sterile manner.  A Time Out was held and the above information confirmed.  A straight catheterization was performed. A sterile speculum was inserted into the vagina and the cervix was grasped at the anterior lip using a single-toothed tenaculum.  The uterus was sounded to 11 cm.  The cervix was gently dilated using Hank's Dilators to accommodate a 12 mm suction curette, that was gently advanced to the uterine fundus.  The suction device was then activated and the curette was slowly rotated to clear the uterine cavity of products of conception.  A sharp curettage with a serrated curette  was then performed to confirm complete emptying  of the uterus.Small amount of bleeding was encountered. The tenaculum was removed along with all instruments  from the  vagina.   The patient was awakened, mobilized and taken to the recovery room in satisfactory condition.  The procedure was well-tolerated.  The patient had received antibiotics prior to procedure while in the Emergency Room (Gentamycin 80 mg and Clindamycin 900 mg). No other antibiotics were ordered.   Estimated Blood Loss:  250 ml      Drains: straight catheterization prior to procedure with 150 ml of clear urine         Total IV Fluids:  800 ml  Specimens: Products of conception         Implants: None         Complications:  None; patient tolerated the procedure well.         Disposition: PACU - hemodynamically stable.         Condition: stable   Hildred Laser, MD [facility]

## 2016-04-18 NOTE — Anesthesia Procedure Notes (Signed)
Performed by: Rosaria Ferries

## 2016-04-18 NOTE — H&P (Addendum)
Reason for Consult: Pyelonephritis in pregnancy vs septic abortion Referring Physician: Lisa Roca, MD  (ER Physician)  Monique Underwood is an 20 y.o. G1P0 pregnant female with h/o [place] who presented to the Emergency Room with complaints of febrile morbidity x 1day and body aches x 3 days (mostly in hips, but also in upper extremities).  Patient is 5w5dby Patient's last menstrual period was 01/20/2016. Estimated Date of Delivery: 10/26/16.  Reports taking Tylenol this morning for subjective fever and for pain.  Denies nausea/vomiting, chest pain, SOB, congestion, abdominal pain.  Does report 1 episode of vaginal spotting ~ 1 month ago but none since then.   Pertinent Gynecological History: Menarche age: [age]Menses: regular every month without intermenstrual spotting Contraception: none , patient is currently pregnant.  Blood transfusions: none Sexually transmitted diseases: no past history Previous GYN Procedures: none  Last pap: patient has never had pap smear due to age.    OB History  Gravida Para Term Preterm AB Living  1 0 0 0 0 0  SAB TAB Ectopic Multiple Live Births  0 0 0 0      # Outcome Date GA Lbr Len/2nd Weight Sex Delivery Anes PTL Lv  1 Current               Past Medical History:  Diagnosis Date  . Anxiety   . EDS (Ehlers-Danlos syndrome)    EDS 3  . Migraine with aura   . Ovarian cyst   . Rotavirus infection 1/25 - 07/22/02   Hospitalized  . Syncope     Past Surgical History:  Procedure Laterality Date  . 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 [facility]  [facility] - [facility]     NML    Family History  Problem Relation Age of Onset  . Thyroid disease Mother   . Diabetes Other   . Cancer Other     Social History   Social History  . Marital status: Single    Spouse name: N/A  . Number of children: N/A  . Years of education: N/A   Occupational History  . Not on file.   Social History Main Topics  . Smoking  status: Current Every Day Smoker    Packs/day: 0.25    Years: 3.00    Types: Cigarettes  . Smokeless tobacco: Never Used     Comment: Parents doesn't know she smokes!!!!!  . Alcohol use No  . Drug use: No  . Sexual activity: Yes    Birth control/ protection: None   Other Topics Concern  . Not on file   Social History Narrative   Lives with boyfriend    Allergies:  Allergies  Allergen Reactions  . Shellfish Allergy Anaphylaxis and Hives  . Lexapro [Escitalopram Oxalate] Other (See Comments)    GI upset.     No current facility-administered medications on file prior to encounter.    Current Outpatient Prescriptions on File Prior to Encounter  Medication Sig Dispense Refill  . acyclovir (ZOVIRAX) 400 MG tablet TAKE 1 TABLET (400 MG TOTAL) BY MOUTH QD PRN.    . ondansetron (ZOFRAN ODT) 4 MG disintegrating tablet Take 1 tablet (4 mg total) by mouth every 8 (eight) hours as needed for nausea or vomiting. 30 tablet 1  . Prenat w/o A FeCbnFeGlu-FA &B6 (CITRANATAL B-CALM) 20-1 & 25 (2) MG MISC Take 20 mg by mouth daily. 30 each 11  .  metoprolol tartrate (LOPRESSOR) 25 MG tablet Take 1 tablet (25 mg total) by mouth 2 (two) times daily. (Patient not taking: Reported on 03/17/2016) 60 tablet 6    Review of Systems  Constitutional: Positive for chills, fever and malaise/fatigue. Negative for weight loss.  HENT: Negative for congestion, hearing loss, sore throat and tinnitus.   Eyes: Negative for blurred vision and discharge.  Respiratory: Negative for cough, sputum production, shortness of breath and wheezing.   Cardiovascular: Negative for chest pain and palpitations.  Gastrointestinal: Negative for abdominal pain, constipation, heartburn, nausea and vomiting.  Genitourinary: Positive for flank pain. Negative for dysuria, frequency and urgency.       Mild bilateral flank pain  Musculoskeletal: Positive for joint pain. Negative for back pain, myalgias and neck pain.       Hip pain  bilaterally  Skin: Negative for itching and rash.  Neurological: Positive for weakness. Negative for dizziness, tingling, seizures and headaches.  Endo/Heme/Allergies: Negative for environmental allergies. Does not bruise/bleed easily.  Psychiatric/Behavioral: Negative for depression, substance abuse and suicidal ideas.      Temp:  [99.9 F (37.7 C)-101.1 F (38.4 C)] 99.9 F (37.7 C) (10/24 1725) Pulse Rate:  [105-123] 115 (10/24 1725) Resp:  [13-24] 18 (10/24 1725) BP: (90-120)/(44-74) 101/60 (10/24 1725) SpO2:  [97 %-100 %] 99 % (10/24 1725) Weight:  [116 lb (52.6 kg)-120 lb 1.6 oz (54.5 kg)] 120 lb 1.6 oz (54.5 kg) (10/24 1727)   Physical Exam  Constitutional: She is oriented to person, place, and time. She appears well-developed and well-nourished. No distress.  HENT:  Head: Normocephalic and atraumatic.  Right Ear: External ear normal.  Left Ear: External ear normal.  Nose: Nose normal.  Mouth/Throat: Oropharynx is clear and moist. No oropharyngeal exudate.  Eyes: Conjunctivae and EOM are normal. Pupils are equal, round, and reactive to light. Right eye exhibits no discharge. Left eye exhibits no discharge. No scleral icterus.  Neck: Normal range of motion. Neck supple. No JVD present. No tracheal deviation present. No thyromegaly present.  Cardiovascular: Regular rhythm.  Exam reveals no gallop and no friction rub.   No murmur heard. Tachycardia  Respiratory: Effort normal and breath sounds normal. No respiratory distress. She has no wheezes. She exhibits no tenderness.  GI: Soft. Bowel sounds are normal. She exhibits no distension. There is tenderness. There is no rebound and no guarding.  Mild tenderness in lower abdomen  Genitourinary: Uterus normal. Rectal exam shows guaiac negative stool. Vaginal discharge found.  Musculoskeletal: Normal range of motion. She exhibits no edema, tenderness or deformity.  Lymphadenopathy:    She has no cervical adenopathy.   Neurological: She is alert and oriented to person, place, and time.  Skin: Skin is warm and dry. She is not diaphoretic. No erythema. There is pallor.  Psychiatric: She has a normal mood and affect. Her behavior is normal.      Results for orders placed or performed during the hospital encounter of 04/18/16 (from the past 48 hour(s))  Comprehensive metabolic panel     Status: Abnormal   Collection Time: 04/18/16  8:54 AM  Result Value Ref Range   Sodium 132 (L) 135 - 145 mmol/L   Potassium 3.5 3.5 - 5.1 mmol/L   Chloride 103 101 - 111 mmol/L   CO2 19 (L) 22 - 32 mmol/L   Glucose, Bld 113 (H) 65 - 99 mg/dL   BUN 6 6 - 20 mg/dL   Creatinine, Ser 0.63 0.44 - 1.00 mg/dL   Calcium  8.8 (L) 8.9 - 10.3 mg/dL   Total Protein 6.6 6.5 - 8.1 g/dL   Albumin 3.5 3.5 - 5.0 g/dL   AST 24 15 - 41 U/L   ALT 21 14 - 54 U/L   Alkaline Phosphatase 53 38 - 126 U/L   Total Bilirubin 0.4 0.3 - 1.2 mg/dL   GFR calc non Af Amer >60 >60 mL/min   GFR calc Af Amer >60 >60 mL/min    Comment: (NOTE) The eGFR has been calculated using the CKD EPI equation. This calculation has not been validated in all clinical situations. eGFR's persistently <60 mL/min signify possible Chronic Kidney Disease.    Anion gap 10 5 - 15  CBC with Differential     Status: Abnormal   Collection Time: 04/18/16  8:54 AM  Result Value Ref Range   WBC 18.6 (H) 3.6 - 11.0 K/uL   RBC 3.82 3.80 - 5.20 MIL/uL   Hemoglobin 12.3 12.0 - 16.0 g/dL   HCT 34.4 (L) 35.0 - 47.0 %   MCV 90.1 80.0 - 100.0 fL   MCH 32.2 26.0 - 34.0 pg   MCHC 35.7 32.0 - 36.0 g/dL   RDW 12.5 11.5 - 14.5 %   Platelets 167 150 - 440 K/uL   Neutrophils Relative % 90 %   Neutro Abs 16.8 (H) 1.4 - 6.5 K/uL   Lymphocytes Relative 4 %   Lymphs Abs 0.7 (L) 1.0 - 3.6 K/uL   Monocytes Relative 6 %   Monocytes Absolute 1.1 (H) 0.2 - 0.9 K/uL   Eosinophils Relative 0 %   Eosinophils Absolute 0.0 0 - 0.7 K/uL   Basophils Relative 0 %   Basophils Absolute 0.0 0 -  0.1 K/uL  hCG, quantitative, pregnancy     Status: Abnormal   Collection Time: 04/18/16  8:54 AM  Result Value Ref Range   hCG, Beta Chain, Quant, S 39,178 (H) <5 mIU/mL    Comment:          GEST. AGE      CONC.  (mIU/mL)   <=1 WEEK        5 - 50     2 WEEKS       50 - 500     3 WEEKS       100 - 10,000     4 WEEKS     1,000 - 30,000     5 WEEKS     3,500 - 115,000   6-8 WEEKS     12,000 - 270,000    12 WEEKS     15,000 - 220,000        FEMALE AND NON-PREGNANT FEMALE:     LESS THAN 5 mIU/mL   Urinalysis complete, with microscopic     Status: Abnormal   Collection Time: 04/18/16 11:15 AM  Result Value Ref Range   Color, Urine YELLOW (A) YELLOW   APPearance CLOUDY (A) CLEAR   Glucose, UA NEGATIVE NEGATIVE mg/dL   Bilirubin Urine NEGATIVE NEGATIVE   Ketones, ur 2+ (A) NEGATIVE mg/dL   Specific Gravity, Urine 1.021 1.005 - 1.030   Hgb urine dipstick 3+ (A) NEGATIVE   pH 6.0 5.0 - 8.0   Protein, ur 30 (A) NEGATIVE mg/dL   Nitrite NEGATIVE NEGATIVE   Leukocytes, UA 3+ (A) NEGATIVE   RBC / HPF TOO NUMEROUS TO COUNT 0 - 5 RBC/hpf   WBC, UA TOO NUMEROUS TO COUNT 0 - 5 WBC/hpf   Bacteria, UA RARE (A) NONE SEEN  Squamous Epithelial / LPF 6-30 (A) NONE SEEN   Mucous PRESENT   Wet prep, genital     Status: Abnormal   Collection Time: 04/18/16  2:37 PM  Result Value Ref Range   Yeast Wet Prep HPF POC NONE SEEN NONE SEEN   Trich, Wet Prep NONE SEEN NONE SEEN   Clue Cells Wet Prep HPF POC NONE SEEN NONE SEEN   WBC, Wet Prep HPF POC MANY (A) NONE SEEN   Sperm NONE SEEN     US Ob Comp Less 14 Wks  Result Date: 04/18/2016 CLINICAL DATA:  Pregnant, suprapubic abdominal pain x2 days EXAM: OBSTETRIC <14 WK ULTRASOUND TECHNIQUE: Transabdominal ultrasound was performed for evaluation of the gestation as well as the maternal uterus and adnexal regions. COMPARISON:  None. FINDINGS: Intrauterine gestational sac: Single Yolk sac:  Not visualized Embryo:  Present Cardiac Activity: Not Visualized.  CRL:   53.7  mm   12 w 0 d Subchorionic hemorrhage:  None visualized. Maternal uterus/adnexae: Bilateral ovaries are within normal limits. No free fluid. IMPRESSION: Single intrauterine gestation without cardiac activity, as described above. Findings meet definitive criteria for failed pregnancy. This follows SRU consensus guidelines: Diagnostic Criteria for Nonviable Pregnancy Early in the First Trimester. Alison Stalling J Med [id number]. Electronically Signed   By: Julian Hy M.D.   On: 04/18/2016 09:50    Assessment/Plan: 1. Missed abortion, suspected septic - discussion had with patient regarding missed abortion, and possible nidus of infection.  Unsure how long missed abortion has been ongoing as patient was noted at [redacted] weeks pregnant with viable IUP on ultrasound  Discussed management options, recommended D&C as most expeditious method of management.  Patient ok with plan. Has received 1 dose of Clindamycin and Gentamycin in the Emergency Room. GC/Cl negative, wet prep with only numerous WBCs. Pending blood cultures. WBC count 18K, patient was febrile of Tmax 101.1, is tachycardic.  Was noted to have hypotension earlier in the Emergency Room with systolic BPs at 29V.  Currently improved with IVF.  Will have patient on Telemetry for at least 24 hrs. Has been consulted in the Emergency Room by Hospitalist, and can reconsult as needed.  2. Possible pyelonephritis - UA with 3+ Hgb, 2+ ketones, 3+ leukocytes, numerous WBCs, no nitrites.  Could be contaminated from vaginal region vs active infection.  Will await urine cultures.  Patient treated with 1 dose of Ceftriaxone in the Emergency Room.  3. H/o Ehler Danlos syndrome -  Stable currently.  Will await Cardiology recommendations at time of surgery.  4. Hyponatremia - mild, will supplement with IVF.  5. Tobacco abuse - currently on nicotine patch and can use gum if needed.   To OR when ready.     A total of 50 minutes were spent face-to-face with  the patient during the encounter and over half of that time involved counseling and coordination of care.   Rubie Maid, MD [facility] 04/18/2016  6:16 PM

## 2016-04-19 ENCOUNTER — Encounter: Payer: Self-pay | Admitting: Obstetrics and Gynecology

## 2016-04-19 DIAGNOSIS — O021 Missed abortion: Secondary | ICD-10-CM

## 2016-04-19 DIAGNOSIS — Q796 Ehlers-Danlos syndrome: Secondary | ICD-10-CM

## 2016-04-19 LAB — URINE CULTURE

## 2016-04-19 LAB — CBC
HEMATOCRIT: 27.7 % — AB (ref 35.0–47.0)
HEMOGLOBIN: 9.5 g/dL — AB (ref 12.0–16.0)
MCH: 31.5 pg (ref 26.0–34.0)
MCHC: 34.4 g/dL (ref 32.0–36.0)
MCV: 91.5 fL (ref 80.0–100.0)
PLATELETS: 120 10*3/uL — AB (ref 150–440)
RBC: 3.02 MIL/uL — AB (ref 3.80–5.20)
RDW: 12.6 % (ref 11.5–14.5)
WBC: 19.6 10*3/uL — ABNORMAL HIGH (ref 3.6–11.0)

## 2016-04-19 LAB — BASIC METABOLIC PANEL
Anion gap: 4 — ABNORMAL LOW (ref 5–15)
BUN: 5 mg/dL — ABNORMAL LOW (ref 6–20)
CHLORIDE: 110 mmol/L (ref 101–111)
CO2: 22 mmol/L (ref 22–32)
CREATININE: 0.56 mg/dL (ref 0.44–1.00)
Calcium: 7.5 mg/dL — ABNORMAL LOW (ref 8.9–10.3)
GFR calc non Af Amer: >60 mL/min (ref >60)
Glucose, Bld: 87 mg/dL (ref 65–99)
POTASSIUM: 3.9 mmol/L (ref 3.5–5.1)
Sodium: 136 mmol/L (ref 135–145)

## 2016-04-19 LAB — ANTIBODY SCREEN: Antibody Screen: NEGATIVE

## 2016-04-19 LAB — ABO/RH: ABO/RH(D): O NEG

## 2016-04-19 MED ORDER — SODIUM CHLORIDE 0.9 % IV BOLUS (SEPSIS)
1000.0000 mL | INTRAVENOUS | Status: DC | PRN
  Administered 2016-04-19: 1000 mL via INTRAVENOUS
  Filled 2016-04-19: qty 1000

## 2016-04-19 MED ORDER — RHO D IMMUNE GLOBULIN 1500 UNIT/2ML IJ SOSY
300.0000 ug | PREFILLED_SYRINGE | Freq: Once | INTRAMUSCULAR | Status: AC
  Administered 2016-04-19: 300 ug via INTRAMUSCULAR
  Filled 2016-04-19: qty 2

## 2016-04-19 NOTE — Progress Notes (Signed)
Nurse requested chaplain to visit this Pt. Pt had a miscourage and was still grieving the loss of her unborn baby. The patient's boyfriend overwhelmed by the news, was struggling to cope. Chaplain provided prayer support and presence.   04/19/16 1500  Clinical Encounter Type  Visited With Patient and family together  Visit Type Initial;Spiritual support  Referral From Nurse  Spiritual Encounters  Spiritual Needs Prayer;Emotional

## 2016-04-19 NOTE — Anesthesia Postprocedure Evaluation (Signed)
Anesthesia Post Note  Patient: Monique Underwood  Procedure(s) Performed: Procedure(s) (LRB): DILATATION AND EVACUATION (N/A)  Patient location during evaluation: PACU Anesthesia Type: General Level of consciousness: awake and alert Pain management: pain level controlled Vital Signs Assessment: post-procedure vital signs reviewed and stable Respiratory status: spontaneous breathing, nonlabored ventilation, respiratory function stable and patient connected to nasal cannula oxygen Cardiovascular status: blood pressure returned to baseline and stable Postop Assessment: no signs of nausea or vomiting Anesthetic complications: no    Last Vitals:  Vitals:   04/18/16 2323 04/19/16 0027  BP: (!) 96/57 (!) 97/59  Pulse: 91 87  Resp:  20  Temp: 36.6 C 36.3 C    Last Pain:  Vitals:   04/19/16 0027  TempSrc: Oral  PainSc:                  Cleda MccreedyJoseph K Lucylle Foulkes

## 2016-04-19 NOTE — Progress Notes (Signed)
1 Day Post-Op Procedure(s) (LRB): DILATATION AND EVACUATION (N/A) Hospital Day #2: Admission for sepsis, missed abortion, possible pyelonephritis  Subjective: Patient reports no complaints. Tolerating diet, ambulating to restroom without difficulty.  Denies much vaginal bleeding. Pain well controlled.  Denies any fevers or chills since yesterday.   Objective: I have reviewed patient's vital signs, intake and output, medications, labs and microbiology.  Temp:  [97.4 F (36.3 C)-102.5 F (39.2 C)] 97.5 F (36.4 C) (10/25 0403) Pulse Rate:  [82-122] 97 (10/25 0622) Resp:  [13-24] 18 (10/25 0403) BP: (83-120)/(44-64) 87/51 (10/25 0622) SpO2:  [95 %-100 %] 100 % (10/25 0403) FiO2 (%):  [21 %] 21 % (10/24 1730) Weight:  [120 lb 1.6 oz (54.5 kg)-129 lb 1.6 oz (58.6 kg)] 129 lb 1.6 oz (58.6 kg) (10/25 0536)  General: alert and no distress Resp: clear to auscultation bilaterally Cardio: regular rate and rhythm, S1, S2 normal, no murmur, click, rub or gallop GI: soft, non-tender; bowel sounds normal; no masses,  no organomegaly Extremities: extremities normal, atraumatic, no cyanosis or edema Vaginal Bleeding: minimal    Labs:   Results for orders placed or performed during the hospital encounter of 04/18/16  Culture, blood (routine x 2)  Result Value Ref Range   Specimen Description BLOOD  LEFT ARM    Special Requests      BOTTLES DRAWN AEROBIC AND ANAEROBIC  AER 4CC ANA 3CC   Culture NO GROWTH < 24 HOURS    Report Status PENDING   Culture, blood (routine x 2)  Result Value Ref Range   Specimen Description BLOOD  RIGHT ARM    Special Requests BOTTLES DRAWN AEROBIC AND ANAEROBIC AER5CC ANA 4CC    Culture NO GROWTH < 24 HOURS    Report Status PENDING   Chlamydia/NGC rt PCR  Result Value Ref Range   Specimen source GC/Chlam ENDOCERVICAL    Chlamydia Tr NOT DETECTED NOT DETECTED   N gonorrhoeae NOT DETECTED NOT DETECTED  Wet prep, genital  Result Value Ref Range   Yeast Wet  Prep HPF POC NONE SEEN NONE SEEN   Trich, Wet Prep NONE SEEN NONE SEEN   Clue Cells Wet Prep HPF POC NONE SEEN NONE SEEN   WBC, Wet Prep HPF POC MANY (A) NONE SEEN   Sperm NONE SEEN   Comprehensive metabolic panel  Result Value Ref Range   Sodium 132 (L) 135 - 145 mmol/L   Potassium 3.5 3.5 - 5.1 mmol/L   Chloride 103 101 - 111 mmol/L   CO2 19 (L) 22 - 32 mmol/L   Glucose, Bld 113 (H) 65 - 99 mg/dL   BUN 6 6 - 20 mg/dL   Creatinine, Ser 1.610.63 0.44 - 1.00 mg/dL   Calcium 8.8 (L) 8.9 - 10.3 mg/dL   Total Protein 6.6 6.5 - 8.1 g/dL   Albumin 3.5 3.5 - 5.0 g/dL   AST 24 15 - 41 U/L   ALT 21 14 - 54 U/L   Alkaline Phosphatase 53 38 - 126 U/L   Total Bilirubin 0.4 0.3 - 1.2 mg/dL   GFR calc non Af Amer >60 >60 mL/min   GFR calc Af Amer >60 >60 mL/min   Anion gap 10 5 - 15  CBC with Differential  Result Value Ref Range   WBC 18.6 (H) 3.6 - 11.0 K/uL   RBC 3.82 3.80 - 5.20 MIL/uL   Hemoglobin 12.3 12.0 - 16.0 g/dL   HCT 09.634.4 (L) 04.535.0 - 40.947.0 %   MCV 90.1 80.0 -  100.0 fL   MCH 32.2 26.0 - 34.0 pg   MCHC 35.7 32.0 - 36.0 g/dL   RDW 58.5 27.7 - 82.4 %   Platelets 167 150 - 440 K/uL   Neutrophils Relative % 90 %   Neutro Abs 16.8 (H) 1.4 - 6.5 K/uL   Lymphocytes Relative 4 %   Lymphs Abs 0.7 (L) 1.0 - 3.6 K/uL   Monocytes Relative 6 %   Monocytes Absolute 1.1 (H) 0.2 - 0.9 K/uL   Eosinophils Relative 0 %   Eosinophils Absolute 0.0 0 - 0.7 K/uL   Basophils Relative 0 %   Basophils Absolute 0.0 0 - 0.1 K/uL  Urinalysis complete, with microscopic  Result Value Ref Range   Color, Urine YELLOW (A) YELLOW   APPearance CLOUDY (A) CLEAR   Glucose, UA NEGATIVE NEGATIVE mg/dL   Bilirubin Urine NEGATIVE NEGATIVE   Ketones, ur 2+ (A) NEGATIVE mg/dL   Specific Gravity, Urine 1.021 1.005 - 1.030   Hgb urine dipstick 3+ (A) NEGATIVE   pH 6.0 5.0 - 8.0   Protein, ur 30 (A) NEGATIVE mg/dL   Nitrite NEGATIVE NEGATIVE   Leukocytes, UA 3+ (A) NEGATIVE   RBC / HPF TOO NUMEROUS TO COUNT 0 - 5  RBC/hpf   WBC, UA TOO NUMEROUS TO COUNT 0 - 5 WBC/hpf   Bacteria, UA RARE (A) NONE SEEN   Squamous Epithelial / LPF 6-30 (A) NONE SEEN   Mucous PRESENT   hCG, quantitative, pregnancy  Result Value Ref Range   hCG, Beta Chain, Quant, S 39,178 (H) <5 mIU/mL  Lactic acid, plasma  Result Value Ref Range   Lactic Acid, Venous 1.5 0.5 - 1.9 mmol/L  Basic metabolic panel  Result Value Ref Range   Sodium 136 135 - 145 mmol/L   Potassium 3.9 3.5 - 5.1 mmol/L   Chloride 110 101 - 111 mmol/L   CO2 22 22 - 32 mmol/L   Glucose, Bld 87 65 - 99 mg/dL   BUN 5 (L) 6 - 20 mg/dL   Creatinine, Ser 2.35 0.44 - 1.00 mg/dL   Calcium 7.5 (L) 8.9 - 10.3 mg/dL   GFR calc non Af Amer >60 >60 mL/min   GFR calc Af Amer >60 >60 mL/min   Anion gap 4 (L) 5 - 15  CBC  Result Value Ref Range   WBC 19.6 (H) 3.6 - 11.0 K/uL   RBC 3.02 (L) 3.80 - 5.20 MIL/uL   Hemoglobin 9.5 (L) 12.0 - 16.0 g/dL   HCT 36.1 (L) 44.3 - 15.4 %   MCV 91.5 80.0 - 100.0 fL   MCH 31.5 26.0 - 34.0 pg   MCHC 34.4 32.0 - 36.0 g/dL   RDW 00.8 67.6 - 19.5 %   Platelets 120 (L) 150 - 440 K/uL     Assessment: POD#1 s/p Suction D&C for suspected septic abortion, HD#2 admitted for sepsis, possible pyelonephritis, hyponatremia.  Patient with h/o Ehler Danlos syndrome, Rh negative status: stable, progressing well and tolerating diet    Plan: Advance diet Encourage ambulation Continue PO medication Continue ABX therapy (Clindamycin, Gentamycin) until at least 24 hrs afebrile.  Blood cultures negative x 24 hrs, pending urine culture. No evidence of urinary symptoms at this time.  F/u pathology Leukocytosis mildly increased, however anticipated as patient had surgery overnight. No further fevers since prior to surgical intervention. Will continue to monitor.  Anemia and thrombocytopenia - likely due to recent blood loss from surgery.  Will repeat in a.m.  Will treat  anemia with PO iron supplementation. Patient currently asymptomatic.   Hyponatremia corrected with IVF.  Rhogam to be given for Rh negative status.  If patient stable and remains afebrile for over 24 hrs, can likely d/c home tomorrow.     Davieon Stockham 04/19/2016, 9:00 AM

## 2016-04-20 ENCOUNTER — Encounter: Payer: 59 | Admitting: Obstetrics and Gynecology

## 2016-04-20 LAB — CBC
HEMATOCRIT: 27.6 % — AB (ref 35.0–47.0)
HEMOGLOBIN: 9.6 g/dL — AB (ref 12.0–16.0)
MCH: 31.9 pg (ref 26.0–34.0)
MCHC: 34.8 g/dL (ref 32.0–36.0)
MCV: 91.9 fL (ref 80.0–100.0)
Platelets: 138 10*3/uL — ABNORMAL LOW (ref 150–440)
RBC: 3.01 MIL/uL — AB (ref 3.80–5.20)
RDW: 13.1 % (ref 11.5–14.5)
WBC: 9.2 10*3/uL (ref 3.6–11.0)

## 2016-04-20 LAB — RHOGAM INJECTION: UNIT DIVISION: 0

## 2016-04-20 LAB — SURGICAL PATHOLOGY

## 2016-04-20 MED ORDER — HYDROCODONE-ACETAMINOPHEN 5-325 MG PO TABS: 1.0000 | ORAL_TABLET | Freq: Four times a day (QID) | ORAL | 0 refills | Status: DC | PRN

## 2016-04-20 MED ORDER — DOXYCYCLINE HYCLATE 100 MG PO CAPS: 100.0000 mg | ORAL_CAPSULE | Freq: Two times a day (BID) | ORAL | 0 refills | Status: DC

## 2016-04-20 MED ORDER — ONDANSETRON 4 MG PO TBDP: 4.0000 mg | ORAL_TABLET | Freq: Three times a day (TID) | ORAL | 1 refills | Status: DC | PRN

## 2016-04-20 MED ORDER — DOCUSATE SODIUM 100 MG PO CAPS: 100.0000 mg | ORAL_CAPSULE | Freq: Two times a day (BID) | ORAL | 1 refills | Status: DC | PRN

## 2016-04-20 MED ORDER — IBUPROFEN 800 MG PO TABS: 800.0000 mg | ORAL_TABLET | Freq: Three times a day (TID) | ORAL | 1 refills | Status: DC | PRN

## 2016-04-20 NOTE — Discharge Summary (Signed)
Physician Discharge Summary  Patient ID: Monique Underwood MRN: [id number] DOB/AGE: 1995-08-26 20 y.o.  Admit date: 04/18/2016 Discharge date: 04/20/2016  Admission Diagnoses:  1. Septic abortion at [redacted] weeks gestation, 2. suspected pyelonephritis, 3. Ehler Danlos syndrome, 4. Tobacco abuse  Discharge Diagnoses:  1. Septic abortion at [redacted] weeks gestation,  2. Ehler Danlos syndrome, 3. Tobacco abuse 4. Anemia secondary to acute blood loss  Discharged Condition: good  Hospital Course: The patient was admitted to the hospital to Telemetry  '\from the Emergency Room due to diagnosis of sepsis, fetal demise, and suspected pyelonephritis. Blood and urine cultures were obtained.  She was initiated on IV antibiotics (Clindamycin and Gentamycin).  Patient also received a single dose of IV Rocephin for suspected pyelonephritis.  On HD#1, the patient underwent a suction D&C for suspected septic abortion.  She was continued on antibiotics on HD#2 until she was over 24 hrs afebrile.  She was also given a dose of Rhogam for Rh negative maternal status. Blood cultures returned negative.  Urine cultures was inconclusive (recommended recollection due to multiple species present).  She was treated with PO iron supplementation for post-operative anemia.  She was also treated for tobacco abuse while inpatient.  By HD#3, POD#2, patient's febrile morbidity and leukocytosis had resolved, and her overall status had improved. She was discharged home.   Consults: Hospitalist  Significant Diagnostic Studies: labs:  Results for orders placed or performed during the hospital encounter of 04/18/16  Urine culture  Result Value Ref Range   Specimen Description URINE, RANDOM    Special Requests NONE    Culture MULTIPLE SPECIES PRESENT, SUGGEST RECOLLECTION (A)    Report Status 04/19/2016 FINAL   Culture, blood (routine x 2)  Result Value Ref Range   Specimen Description BLOOD  LEFT ARM    Special Requests      BOTTLES  DRAWN AEROBIC AND ANAEROBIC  AER 4CC ANA 3CC   Culture NO GROWTH 5 DAYS    Report Status 04/23/2016 FINAL   Culture, blood (routine x 2)  Result Value Ref Range   Specimen Description BLOOD  RIGHT ARM    Special Requests BOTTLES DRAWN AEROBIC AND ANAEROBIC AER5CC ANA 4CC    Culture NO GROWTH 5 DAYS    Report Status 04/23/2016 FINAL   Chlamydia/NGC rt PCR  Result Value Ref Range   Specimen source GC/Chlam ENDOCERVICAL    Chlamydia Tr NOT DETECTED NOT DETECTED   N gonorrhoeae NOT DETECTED NOT DETECTED  Wet prep, genital  Result Value Ref Range   Yeast Wet Prep HPF POC NONE SEEN NONE SEEN   Trich, Wet Prep NONE SEEN NONE SEEN   Clue Cells Wet Prep HPF POC NONE SEEN NONE SEEN   WBC, Wet Prep HPF POC MANY (A) NONE SEEN   Sperm NONE SEEN   Comprehensive metabolic panel  Result Value Ref Range   Sodium 132 (L) 135 - 145 mmol/L   Potassium 3.5 3.5 - 5.1 mmol/L   Chloride 103 101 - 111 mmol/L   CO2 19 (L) 22 - 32 mmol/L   Glucose, Bld 113 (H) 65 - 99 mg/dL   BUN 6 6 - 20 mg/dL   Creatinine, Ser 1.47 0.44 - 1.00 mg/dL   Calcium 8.8 (L) 8.9 - 10.3 mg/dL   Total Protein 6.6 6.5 - 8.1 g/dL   Albumin 3.5 3.5 - 5.0 g/dL   AST 24 15 - 41 U/L   ALT 21 14 - 54 U/L   Alkaline Phosphatase 53 38 -  126 U/L   Total Bilirubin 0.4 0.3 - 1.2 mg/dL   GFR calc non Af Amer >60 >60 mL/min   GFR calc Af Amer >60 >60 mL/min   Anion gap 10 5 - 15  CBC with Differential  Result Value Ref Range   WBC 18.6 (H) 3.6 - 11.0 K/uL   RBC 3.82 3.80 - 5.20 MIL/uL   Hemoglobin 12.3 12.0 - 16.0 g/dL   HCT 91.4 (L) 78.2 - 95.6 %   MCV 90.1 80.0 - 100.0 fL   MCH 32.2 26.0 - 34.0 pg   MCHC 35.7 32.0 - 36.0 g/dL   RDW 21.3 08.6 - 57.8 %   Platelets 167 150 - 440 K/uL   Neutrophils Relative % 90 %   Neutro Abs 16.8 (H) 1.4 - 6.5 K/uL   Lymphocytes Relative 4 %   Lymphs Abs 0.7 (L) 1.0 - 3.6 K/uL   Monocytes Relative 6 %   Monocytes Absolute 1.1 (H) 0.2 - 0.9 K/uL   Eosinophils Relative 0 %   Eosinophils  Absolute 0.0 0 - 0.7 K/uL   Basophils Relative 0 %   Basophils Absolute 0.0 0 - 0.1 K/uL  Urinalysis complete, with microscopic  Result Value Ref Range   Color, Urine YELLOW (A) YELLOW   APPearance CLOUDY (A) CLEAR   Glucose, UA NEGATIVE NEGATIVE mg/dL   Bilirubin Urine NEGATIVE NEGATIVE   Ketones, ur 2+ (A) NEGATIVE mg/dL   Specific Gravity, Urine 1.021 1.005 - 1.030   Hgb urine dipstick 3+ (A) NEGATIVE   pH 6.0 5.0 - 8.0   Protein, ur 30 (A) NEGATIVE mg/dL   Nitrite NEGATIVE NEGATIVE   Leukocytes, UA 3+ (A) NEGATIVE   RBC / HPF TOO NUMEROUS TO COUNT 0 - 5 RBC/hpf   WBC, UA TOO NUMEROUS TO COUNT 0 - 5 WBC/hpf   Bacteria, UA RARE (A) NONE SEEN   Squamous Epithelial / LPF 6-30 (A) NONE SEEN   Mucous PRESENT   hCG, quantitative, pregnancy  Result Value Ref Range   hCG, Beta Chain, Quant, S 39,178 (H) <5 mIU/mL  Lactic acid, plasma  Result Value Ref Range   Lactic Acid, Venous 1.5 0.5 - 1.9 mmol/L  Basic metabolic panel  Result Value Ref Range   Sodium 136 135 - 145 mmol/L   Potassium 3.9 3.5 - 5.1 mmol/L   Chloride 110 101 - 111 mmol/L   CO2 22 22 - 32 mmol/L   Glucose, Bld 87 65 - 99 mg/dL   BUN 5 (L) 6 - 20 mg/dL   Creatinine, Ser 4.69 0.44 - 1.00 mg/dL   Calcium 7.5 (L) 8.9 - 10.3 mg/dL   GFR calc non Af Amer >60 >60 mL/min   GFR calc Af Amer >60 >60 mL/min   Anion gap 4 (L) 5 - 15  CBC  Result Value Ref Range   WBC 19.6 (H) 3.6 - 11.0 K/uL   RBC 3.02 (L) 3.80 - 5.20 MIL/uL   Hemoglobin 9.5 (L) 12.0 - 16.0 g/dL   HCT 62.9 (L) 52.8 - 41.3 %   MCV 91.5 80.0 - 100.0 fL   MCH 31.5 26.0 - 34.0 pg   MCHC 34.4 32.0 - 36.0 g/dL   RDW 24.4 01.0 - 27.2 %   Platelets 120 (L) 150 - 440 K/uL  CBC  Result Value Ref Range   WBC 9.2 3.6 - 11.0 K/uL   RBC 3.01 (L) 3.80 - 5.20 MIL/uL   Hemoglobin 9.6 (L) 12.0 - 16.0 g/dL  HCT 27.6 (L) 35.0 - 47.0 %   MCV 91.9 80.0 - 100.0 fL   MCH 31.9 26.0 - 34.0 pg   MCHC 34.8 32.0 - 36.0 g/dL   RDW 16.113.1 09.611.5 - 04.514.5 %   Platelets 138  (L) 150 - 440 K/uL  ABO/Rh  Result Value Ref Range   ABO/RH(D) O NEG   Antibody screen  Result Value Ref Range   Antibody Screen NEG   Rhogam injection  Result Value Ref Range   Unit Number [id number]    Blood Component Type RHIG    Unit division 00    Status of Unit ISSUED,FINAL    Transfusion Status OK TO TRANSFUSE      Treatments: IV hydration, antibiotics: gentamycin, clindamycin, and ceftriaxone, analgesia: Percocet and Ibuprofen and surgery: Suction Dilation and Curettage  Discharge Exam: Blood pressure 102/66, pulse 76, temperature 97.6 F (36.4 C), resp. rate 20, height 5\' 8"  (1.727 m), weight 129 lb 1.6 oz (58.6 kg), last menstrual period 01/20/2016, SpO2 98 %.  General appearance: alert and no distress Back: symmetric, no curvature. ROM normal. No CVA tenderness. Resp: clear to auscultation bilaterally Cardio: regular rate and rhythm, S1, S2 normal, no murmur, click, rub or gallop GI: soft, non-tender; bowel sounds normal; no masses,  no organomegaly Pelvic: deferred and minimal vaginal bleeding Extremities: extremities normal, atraumatic, no cyanosis or edema Pulses: 2+ and symmetric Skin: Skin color, texture, turgor normal. No rashes or lesions Neurologic: Grossly normal  Disposition: 01-Home or Self Care     Medication List    STOP taking these medications   CITRANATAL B-CALM 20-1 & 25 (2) MG Misc     TAKE these medications   acyclovir 400 MG tablet Commonly known as:  ZOVIRAX TAKE 1 TABLET (400 MG TOTAL) BY MOUTH QD PRN.   docusate sodium 100 MG capsule Commonly known as:  COLACE Take 1 capsule (100 mg total) by mouth 2 (two) times daily as needed for mild constipation.   doxycycline 100 MG capsule Commonly known as:  VIBRAMYCIN Take 1 capsule (100 mg total) by mouth 2 (two) times daily.   HYDROcodone-acetaminophen 5-325 MG tablet Commonly known as:  NORCO/VICODIN Take 1 tablet by mouth every 6 (six) hours as needed.   ibuprofen 800 MG  tablet Commonly known as:  ADVIL,MOTRIN Take 1 tablet (800 mg total) by mouth every 8 (eight) hours as needed.   metoprolol tartrate 25 MG tablet Commonly known as:  LOPRESSOR Take 1 tablet (25 mg total) by mouth 2 (two) times daily.   ondansetron 4 MG disintegrating tablet Commonly known as:  ZOFRAN ODT Take 1 tablet (4 mg total) by mouth every 8 (eight) hours as needed for nausea or vomiting.      Follow-up Information    Hildred LaserAnika Camey Edell, MD. Schedule an appointment as soon as possible for a visit on 05/02/2016.   Specialties:  Obstetrics and Gynecology, Radiology Why:  Tuesday at 2:45pm for hospital follow-up Contact information: [address] [place] [id number] [phone]           Signed: Hildred Lasernika Esiah Bazinet 04/20/2016, 8:59 AM

## 2016-04-20 NOTE — Progress Notes (Signed)
04/20/2016 13:00  Monique Underwood to be D/C'd Home per MD order.  Discussed prescriptions and follow up appointments with the patient. Prescriptions given to patient, medication list explained in detail. Pt verbalized understanding.    Medication List    STOP taking these medications   CITRANATAL B-CALM 20-1 & 25 (2) MG Misc     TAKE these medications   acyclovir 400 MG tablet Commonly known as:  ZOVIRAX TAKE 1 TABLET (400 MG TOTAL) BY MOUTH QD PRN.   docusate sodium 100 MG capsule Commonly known as:  COLACE Take 1 capsule (100 mg total) by mouth 2 (two) times daily as needed for mild constipation.   doxycycline 100 MG capsule Commonly known as:  VIBRAMYCIN Take 1 capsule (100 mg total) by mouth 2 (two) times daily.   HYDROcodone-acetaminophen 5-325 MG tablet Commonly known as:  NORCO/VICODIN Take 1 tablet by mouth every 6 (six) hours as needed.   ibuprofen 800 MG tablet Commonly known as:  ADVIL,MOTRIN Take 1 tablet (800 mg total) by mouth every 8 (eight) hours as needed.   metoprolol tartrate 25 MG tablet Commonly known as:  LOPRESSOR Take 1 tablet (25 mg total) by mouth 2 (two) times daily.   ondansetron 4 MG disintegrating tablet Commonly known as:  ZOFRAN ODT Take 1 tablet (4 mg total) by mouth every 8 (eight) hours as needed for nausea or vomiting.       Vitals:   04/20/16 0545 04/20/16 0918  BP: 102/66   Pulse: 76   Resp: 20   Temp: 97.9 F (36.6 C) 97.6 F (36.4 C)    Skin clean, dry and intact without evidence of skin break down, no evidence of skin tears noted. IV catheter discontinued intact. Site without signs and symptoms of complications. Dressing and pressure applied. Pt denies pain at this time. No complaints noted.  An After Visit Summary was printed and given to the patient. Patient escorted via WC, and D/C home via private auto.  Bradly Chris

## 2016-04-20 NOTE — Progress Notes (Signed)
2 Days Post-Op Procedure(s) (LRB): DILATATION AND EVACUATION (N/A) Hospital Day #3: Admission for sepsis, missed abortion, possible pyelonephritis  Subjective: Patient reports no complaints. Tolerating diet, ambulating to restroom without difficulty.  Notes mild vaginal bleeding. Pain well controlled.  Denies any fevers or chills for over 24 hrs. Objective: I have reviewed patient's vital signs, intake and output, medications, labs and microbiology.  Temp:  [97.7 F (36.5 C)-98.2 F (36.8 C)] 97.9 F (36.6 C) (10/26 0545) Pulse Rate:  [76-104] 76 (10/26 0545) Resp:  [16-20] 20 (10/26 0545) BP: (102-107)/(63-68) 102/66 (10/26 0545) SpO2:  [95 %-100 %] 98 % (10/26 0545)  General: alert and no distress Resp: clear to auscultation bilaterally Cardio: regular rate and rhythm, S1, S2 normal, no murmur, click, rub or gallop GI: soft, non-tender; bowel sounds normal; no masses,  no organomegaly Extremities: extremities normal, atraumatic, no cyanosis or edema Vaginal Bleeding: minimal    Labs:   Results for orders placed or performed during the hospital encounter of 04/18/16  Urine culture  Result Value Ref Range   Specimen Description URINE, RANDOM    Special Requests NONE    Culture MULTIPLE SPECIES PRESENT, SUGGEST RECOLLECTION (A)    Report Status 04/19/2016 FINAL   Culture, blood (routine x 2)  Result Value Ref Range   Specimen Description BLOOD  LEFT ARM    Special Requests      BOTTLES DRAWN AEROBIC AND ANAEROBIC  AER 4CC ANA 3CC   Culture NO GROWTH 2 DAYS    Report Status PENDING   Culture, blood (routine x 2)  Result Value Ref Range   Specimen Description BLOOD  RIGHT ARM    Special Requests BOTTLES DRAWN AEROBIC AND ANAEROBIC AER5CC ANA 4CC    Culture NO GROWTH 2 DAYS    Report Status PENDING   Chlamydia/NGC rt PCR  Result Value Ref Range   Specimen source GC/Chlam ENDOCERVICAL    Chlamydia Tr NOT DETECTED NOT DETECTED   N gonorrhoeae NOT DETECTED NOT DETECTED   Wet prep, genital  Result Value Ref Range   Yeast Wet Prep HPF POC NONE SEEN NONE SEEN   Trich, Wet Prep NONE SEEN NONE SEEN   Clue Cells Wet Prep HPF POC NONE SEEN NONE SEEN   WBC, Wet Prep HPF POC MANY (A) NONE SEEN   Sperm NONE SEEN   Comprehensive metabolic panel  Result Value Ref Range   Sodium 132 (L) 135 - 145 mmol/L   Potassium 3.5 3.5 - 5.1 mmol/L   Chloride 103 101 - 111 mmol/L   CO2 19 (L) 22 - 32 mmol/L   Glucose, Bld 113 (H) 65 - 99 mg/dL   BUN 6 6 - 20 mg/dL   Creatinine, Ser 1.61 0.44 - 1.00 mg/dL   Calcium 8.8 (L) 8.9 - 10.3 mg/dL   Total Protein 6.6 6.5 - 8.1 g/dL   Albumin 3.5 3.5 - 5.0 g/dL   AST 24 15 - 41 U/L   ALT 21 14 - 54 U/L   Alkaline Phosphatase 53 38 - 126 U/L   Total Bilirubin 0.4 0.3 - 1.2 mg/dL   GFR calc non Af Amer >60 >60 mL/min   GFR calc Af Amer >60 >60 mL/min   Anion gap 10 5 - 15  CBC with Differential  Result Value Ref Range   WBC 18.6 (H) 3.6 - 11.0 K/uL   RBC 3.82 3.80 - 5.20 MIL/uL   Hemoglobin 12.3 12.0 - 16.0 g/dL   HCT 09.6 (L) 04.5 - 40.9 %  MCV 90.1 80.0 - 100.0 fL   MCH 32.2 26.0 - 34.0 pg   MCHC 35.7 32.0 - 36.0 g/dL   RDW 16.1 09.6 - 04.5 %   Platelets 167 150 - 440 K/uL   Neutrophils Relative % 90 %   Neutro Abs 16.8 (H) 1.4 - 6.5 K/uL   Lymphocytes Relative 4 %   Lymphs Abs 0.7 (L) 1.0 - 3.6 K/uL   Monocytes Relative 6 %   Monocytes Absolute 1.1 (H) 0.2 - 0.9 K/uL   Eosinophils Relative 0 %   Eosinophils Absolute 0.0 0 - 0.7 K/uL   Basophils Relative 0 %   Basophils Absolute 0.0 0 - 0.1 K/uL  Urinalysis complete, with microscopic  Result Value Ref Range   Color, Urine YELLOW (A) YELLOW   APPearance CLOUDY (A) CLEAR   Glucose, UA NEGATIVE NEGATIVE mg/dL   Bilirubin Urine NEGATIVE NEGATIVE   Ketones, ur 2+ (A) NEGATIVE mg/dL   Specific Gravity, Urine 1.021 1.005 - 1.030   Hgb urine dipstick 3+ (A) NEGATIVE   pH 6.0 5.0 - 8.0   Protein, ur 30 (A) NEGATIVE mg/dL   Nitrite NEGATIVE NEGATIVE   Leukocytes, UA  3+ (A) NEGATIVE   RBC / HPF TOO NUMEROUS TO COUNT 0 - 5 RBC/hpf   WBC, UA TOO NUMEROUS TO COUNT 0 - 5 WBC/hpf   Bacteria, UA RARE (A) NONE SEEN   Squamous Epithelial / LPF 6-30 (A) NONE SEEN   Mucous PRESENT   hCG, quantitative, pregnancy  Result Value Ref Range   hCG, Beta Chain, Quant, S 39,178 (H) <5 mIU/mL  Lactic acid, plasma  Result Value Ref Range   Lactic Acid, Venous 1.5 0.5 - 1.9 mmol/L  Basic metabolic panel  Result Value Ref Range   Sodium 136 135 - 145 mmol/L   Potassium 3.9 3.5 - 5.1 mmol/L   Chloride 110 101 - 111 mmol/L   CO2 22 22 - 32 mmol/L   Glucose, Bld 87 65 - 99 mg/dL   BUN 5 (L) 6 - 20 mg/dL   Creatinine, Ser 4.09 0.44 - 1.00 mg/dL   Calcium 7.5 (L) 8.9 - 10.3 mg/dL   GFR calc non Af Amer >60 >60 mL/min   GFR calc Af Amer >60 >60 mL/min   Anion gap 4 (L) 5 - 15  CBC  Result Value Ref Range   WBC 19.6 (H) 3.6 - 11.0 K/uL   RBC 3.02 (L) 3.80 - 5.20 MIL/uL   Hemoglobin 9.5 (L) 12.0 - 16.0 g/dL   HCT 81.1 (L) 91.4 - 78.2 %   MCV 91.5 80.0 - 100.0 fL   MCH 31.5 26.0 - 34.0 pg   MCHC 34.4 32.0 - 36.0 g/dL   RDW 95.6 21.3 - 08.6 %   Platelets 120 (L) 150 - 440 K/uL  CBC  Result Value Ref Range   WBC 9.2 3.6 - 11.0 K/uL   RBC 3.01 (L) 3.80 - 5.20 MIL/uL   Hemoglobin 9.6 (L) 12.0 - 16.0 g/dL   HCT 57.8 (L) 46.9 - 62.9 %   MCV 91.9 80.0 - 100.0 fL   MCH 31.9 26.0 - 34.0 pg   MCHC 34.8 32.0 - 36.0 g/dL   RDW 52.8 41.3 - 24.4 %   Platelets 138 (L) 150 - 440 K/uL  ABO/Rh  Result Value Ref Range   ABO/RH(D) O NEG   Antibody screen  Result Value Ref Range   Antibody Screen NEG   Rhogam injection  Result Value Ref Range  Unit Number [id number]    Blood Component Type RHIG    Unit division 00    Status of Unit ISSUED    Transfusion Status OK TO TRANSFUSE      Assessment: POD#2 s/p Suction D&C for suspected septic abortion, HD#3 admitted for sepsis, possible pyelonephritis, hyponatremia.  Patient with h/o Ehler Danlos syndrome, Rh negative  status: stable and tolerating diet    Plan: Regular diet Encourage ambulation Continue PO medication Discontinued antibiotics overnight as patient has been over 24 hrs afebrile.  Blood cultures negative for over 24 hrs, urine culture inconclusive (mixed species, suggest recollection). No evidence of urinary symptoms at this time.  Leukocytosis has resolved.  Anemia and thrombocytopenia - likely due to recent blood loss from surgery.  Stable Hct, and thrombocytopenia improving.  Will treat anemia with PO iron supplementation. Patient currently asymptomatic.  Hyponatremia corrected with IVF.  Rhogam given yesterday for Rh negative status.  Patient able to d/c home today.     Hildred LaserAnika Cristen Bredeson, MD 04/20/2016, 8:49 AM

## 2016-04-20 NOTE — Discharge Instructions (Signed)
Miscarriage °A miscarriage is the sudden loss of an unborn baby (fetus) before the 20th week of pregnancy. Most miscarriages happen in the first 3 months of pregnancy. Sometimes, it happens before a woman even knows she is pregnant. A miscarriage is also called a "spontaneous miscarriage" or "early pregnancy loss." Having a miscarriage can be an emotional experience. Talk with your caregiver about any questions you may have about miscarrying, the grieving process, and your future pregnancy plans. °CAUSES  °· Problems with the fetal chromosomes that make it impossible for the baby to develop normally. Problems with the baby's genes or chromosomes are most often the result of errors that occur, by chance, as the embryo divides and grows. The problems are not inherited from the parents. °· Infection of the cervix or uterus.   °· Hormone problems.   °· Problems with the cervix, such as having an incompetent cervix. This is when the tissue in the cervix is not strong enough to hold the pregnancy.   °· Problems with the uterus, such as an abnormally shaped uterus, uterine fibroids, or congenital abnormalities.   °· Certain medical conditions.   °· Smoking, drinking alcohol, or taking illegal drugs.   °· Trauma.   °Often, the cause of a miscarriage is unknown.  °SYMPTOMS  °· Vaginal bleeding or spotting, with or without cramps or pain. °· Pain or cramping in the abdomen or lower back. °· Passing fluid, tissue, or blood clots from the vagina. °DIAGNOSIS  °Your caregiver will perform a physical exam. You may also have an ultrasound to confirm the miscarriage. Blood or urine tests may also be ordered. °TREATMENT  °· Sometimes, treatment is not necessary if you naturally pass all the fetal tissue that was in the uterus. If some of the fetus or placenta remains in the body (incomplete miscarriage), tissue left behind may become infected and must be removed. Usually, a dilation and curettage (D and C) procedure is performed.  During a D and C procedure, the cervix is widened (dilated) and any remaining fetal or placental tissue is gently removed from the uterus. °· Antibiotic medicines are prescribed if there is an infection. Other medicines may be given to reduce the size of the uterus (contract) if there is a lot of bleeding. °· If you have Rh negative blood and your baby was Rh positive, you will need a Rh immunoglobulin shot. This shot will protect any future baby from having Rh blood problems in future pregnancies. °HOME CARE INSTRUCTIONS  °· Your caregiver may order bed rest or may allow you to continue light activity. Resume activity as directed by your caregiver. °· Have someone help with home and family responsibilities during this time.   °· Keep track of the number of sanitary pads you use each day and how soaked (saturated) they are. Write down this information.   °· Do not use tampons. Do not douche or have sexual intercourse until approved by your caregiver.   °· Only take over-the-counter or prescription medicines for pain or discomfort as directed by your caregiver.   °· Do not take aspirin. Aspirin can cause bleeding.   °· Keep all follow-up appointments with your caregiver.   °· If you or your partner have problems with grieving, talk to your caregiver or seek counseling to help cope with the pregnancy loss. Allow enough time to grieve before trying to get pregnant again.   °SEEK IMMEDIATE MEDICAL CARE IF:  °· You have severe cramps or pain in your back or abdomen. °· You have a fever. °· You pass large blood clots (walnut-sized   or larger) ortissue from your vagina. Save any tissue for your caregiver to inspect.   Your bleeding increases.   You have a thick, bad-smelling vaginal discharge.  You become lightheaded, weak, or you faint.   You have chills.  MAKE SURE YOU:  Understand these instructions.  Will watch your condition.  Will get help right away if you are not doing well or get worse.   This  information is not intended to replace advice given to you by your health care provider. Make sure you discuss any questions you have with your health care provider.   Document Released: 12/06/2000 Document Revised: 10/07/2012 Document Reviewed: 08/01/2011 Elsevier Interactive Patient Education 2016 [employer]     Sepsis, Adult Sepsis is a serious infection of your blood or tissues that affects your whole body. The infection that causes sepsis may be bacterial, viral, fungal, or parasitic. Sepsis may be life threatening. Sepsis can cause your blood pressure to drop. This may result in shock. Shock causes your central nervous system and your organs to stop working correctly.  RISK FACTORS Sepsis can happen in anyone, but it is more likely to happen in people who have weakened immune systems. SIGNS AND SYMPTOMS  Symptoms of sepsis can include:  Fever or low body temperature (hypothermia).  Rapid breathing (hyperventilation).  Chills.  Rapid heartbeat (tachycardia).  Confusion or light-headedness.  Trouble breathing.  Urinating much less than usual.  Cool, clammy skin or red, flushed skin.  Other problems with the heart, kidneys, or brain. DIAGNOSIS  Your health care provider will likely do tests to look for an infection, to see if the infection has spread to your blood, and to see how serious your condition is. Tests can include:  Blood tests, including cultures of your blood.  Cultures of other fluids from your body, such as:  Urine.  Pus from wounds.  Mucus coughed up from your lungs.  Urine tests other than cultures.  X-ray exams or other imaging tests. TREATMENT  Treatment will begin with elimination of the source of infection. If your sepsis is likely caused by a bacterial or fungal infection, you will be given antibiotic or antifungal medicines. You may also receive:  Oxygen.  Fluids through an IV tube.  Medicines to increase your blood pressure.  A  machine to clean your blood (dialysis) if your kidneys fail.  A machine to help you breathe if your lungs fail. SEEK IMMEDIATE MEDICAL CARE IF: You get an infection or develop any of the signs and symptoms of sepsis after surgery or a hospitalization.   This information is not intended to replace advice given to you by your health care provider. Make sure you discuss any questions you have with your health care provider.   Document Released: 03/11/2003 Document Revised: 10/27/2014 Document Reviewed: 02/17/2013 Elsevier Interactive Patient Education [employer]

## 2016-04-23 LAB — CULTURE, BLOOD (ROUTINE X 2)
CULTURE: NO GROWTH
Culture: NO GROWTH

## 2016-04-26 ENCOUNTER — Telehealth: Payer: Self-pay | Admitting: Obstetrics and Gynecology

## 2016-04-26 NOTE — Telephone Encounter (Signed)
PT NEEDS HYDROCODONE REFILLED

## 2016-04-26 NOTE — Telephone Encounter (Signed)
Is patient still having significant pain? Is she alternating the Percocet with the Ibuprofen?  Usually patient's pain subsides after the first week, so I am concerned if she is still having significant pain.  She may need to be seen sooner than her 05/02/16 post-op appointment.    Dr. Valentino Saxonherry

## 2016-04-27 NOTE — Telephone Encounter (Signed)
Called pt she states that her pain is currently 3/10. Pt states that she is really having more discomfort than pain, but requests moro refill. Advised pt that discomfort does not warrent norco, advised pt on the use of heating pad, ES Tylenol, as well as Motrin PM to help her rest. PT gave verbal understanding. Advised to call back if pain worsens.

## 2016-05-02 ENCOUNTER — Ambulatory Visit (INDEPENDENT_AMBULATORY_CARE_PROVIDER_SITE_OTHER): Payer: 59 | Admitting: Obstetrics and Gynecology

## 2016-05-02 ENCOUNTER — Encounter: Payer: Self-pay | Admitting: Obstetrics and Gynecology

## 2016-05-02 VITALS — BP 105/70 | HR 111 | Ht 67.0 in | Wt 117.4 lb

## 2016-05-02 DIAGNOSIS — Z6791 Unspecified blood type, Rh negative: Secondary | ICD-10-CM

## 2016-05-02 DIAGNOSIS — Z4889 Encounter for other specified surgical aftercare: Secondary | ICD-10-CM

## 2016-05-02 DIAGNOSIS — O0387 Sepsis following complete or unspecified spontaneous abortion: Secondary | ICD-10-CM

## 2016-05-02 DIAGNOSIS — Z9889 Other specified postprocedural states: Secondary | ICD-10-CM

## 2016-05-02 DIAGNOSIS — R1011 Right upper quadrant pain: Secondary | ICD-10-CM

## 2016-05-02 DIAGNOSIS — D649 Anemia, unspecified: Secondary | ICD-10-CM

## 2016-05-02 MED ORDER — NORETHINDRONE 0.35 MG PO TABS: 1.0000 | ORAL_TABLET | Freq: Every day | ORAL | 11 refills | Status: DC

## 2016-05-02 NOTE — Progress Notes (Signed)
    OBSTETRICS/GYNECOLOGY POST-OPERATIVE CLINIC VISIT  Subjective:     Monique Underwood is a 20 y.o. G6P0010 female who presents to the clinic 2 weeks status post hospitalization for sepsis and suction D&C for septic abortion. Eating a regular diet without difficulty. Bowel movements are normal. The patient notes that she is having mild to moderate RUQ pain, feels like a spasm, is intermittent. Denies nausea/vomiting, fevers, chills. Notes that she is having spotting but otherwise no bleeding.   The following portions of the patient's history were reviewed and updated as appropriate: allergies, current medications, past family history, past medical history, past social history, past surgical history and problem list.  Review of Systems  Pertinent items noted in HPI and remainder of comprehensive ROS otherwise negative.    Objective:    BP 105/70 (BP Location: Left Arm, Patient Position: Sitting, Cuff Size: Normal)   Pulse (!) 111   Ht 5\' 7"  (1.702 m)   Wt 117 lb 6.4 oz (53.3 kg)   LMP 01/20/2016   Breastfeeding? Unknown   BMI 18.39 kg/m  General:  alert and no distress  Abdomen: soft, bowel sounds active, non-tender.   Pelvis:   external genitalia normal. Vagina with scant brown blood in vaginal vault. Cervix closed.     Pathology:   DIAGNOSIS:  A. UTERINE CONTENTS; DILATATION AND EVACUATION:  - PLACENTAL TISSUE WITH INTENSE SUPPURATIVE INFLAMMATION OF MEMBRANES  AND INTERVILLOUS SPACES.  - VILLOUS CHANGES CONSISTENT WITH INTRAUTERINE FETAL DEMISE.   Comment:  The features are consistent with placental bacterial infection. Fetal  tissue is present.    Labs:  Results for RAMON, RIDLING (MRN [id number]) as of 05/04/2016 21:05  Ref. Range 04/20/2016 05:08  WBC Latest Ref Range: 3.6 - 11.0 K/uL 9.2  RBC Latest Ref Range: 3.80 - 5.20 MIL/uL 3.01 (L)  Hemoglobin Latest Ref Range: 12.0 - 16.0 g/dL 9.6 (L)  HCT Latest Ref Range: 35.0 - 47.0 % 27.6 (L)  MCV Latest Ref Range:  80.0 - 100.0 fL 91.9  MCH Latest Ref Range: 26.0 - 34.0 pg 31.9  MCHC Latest Ref Range: 32.0 - 36.0 g/dL 67.6  RDW Latest Ref Range: 11.5 - 14.5 % 13.1  Platelets Latest Ref Range: 150 - 440 K/uL 138 (L)     Assessment:    Doing well postoperatively. S/p D&C.   S/p septic abortion Rh negative status  RUQ pain (spasm) Anemia post-operatively.  Plan:   1. Continue any current medications. 2. Wound care discussed. 3. Operative findings reviewed. Pathology report discussed. 4. Activity restrictions: none 5. Anticipated return to work: now. 6. Discussed contraception with patient, patient desires OCPs.  Cannot take estrogen containing products due to medical history (Ehler Danlos syndrome).  Discussed progesterone options, patient desires pill.  Discussed importance of taking pill at same time everyday, and using back up method for first month.  Advised on Sunday start.  7. Will order BHCG to follow trend.  8. RUQ pain, more spasm like in nature.  Discussed heating pads, offered muscle relaxer, patient declined.  Reviewed recent labs, LFTs normal.  9.  Patient Rh negative, received Rhogam post-procedure.  10. Will recheck Hgb for post-operative anemia.  Patient curently asymptomatic.  Notes non-compliance with iron therapy.  11. Follow up: 8 months for annual exam    Hildred Laser, MD [facility]

## 2016-05-03 ENCOUNTER — Other Ambulatory Visit: Payer: Self-pay | Admitting: Obstetrics and Gynecology

## 2016-05-03 DIAGNOSIS — O0387 Sepsis following complete or unspecified spontaneous abortion: Secondary | ICD-10-CM

## 2016-05-03 LAB — HEMOGLOBIN AND HEMATOCRIT, BLOOD
HEMATOCRIT: 38.4 % (ref 34.0–46.6)
HEMOGLOBIN: 12.9 g/dL (ref 11.1–15.9)

## 2016-05-03 LAB — BETA HCG QUANT (REF LAB): HCG QUANT: 47 m[IU]/mL

## 2016-05-11 ENCOUNTER — Encounter: Payer: Self-pay | Admitting: Internal Medicine

## 2016-05-11 ENCOUNTER — Ambulatory Visit (INDEPENDENT_AMBULATORY_CARE_PROVIDER_SITE_OTHER): Payer: 59 | Admitting: Internal Medicine

## 2016-05-11 VITALS — BP 117/80 | HR 95 | Ht 68.0 in | Wt 119.0 lb

## 2016-05-11 DIAGNOSIS — G901 Familial dysautonomia [Riley-Day]: Secondary | ICD-10-CM

## 2016-05-11 DIAGNOSIS — G909 Disorder of the autonomic nervous system, unspecified: Secondary | ICD-10-CM | POA: Diagnosis not present

## 2016-05-11 DIAGNOSIS — R002 Palpitations: Secondary | ICD-10-CM

## 2016-05-11 NOTE — Progress Notes (Signed)
Patient Care Team: Joaquim NamGraham S Duncan, MD as PCP - General (Family Medicine)   HPI  Monique Underwood is a 20 y.o. female Seen in followup for exertional palps consistent with sinus rhythm with other findings consistent with autonomic dysfunction incl hypotension, violaceous discoloration of lower extremites syncope    She was also noted to have joint laxity and arachnodactyly  Echo showed normal LV function and structure CT neg  She's had no interval  Syncope she does however have some a.m.(particularly) dizziness.  She has recently undergone that she was pregnant and lost the baby. Is currently without a job because of some situations at the workplace. She her boyfriend moved in place. She is very stressed. She and her primary care physician has tried antidepressants without success. Past Medical History:  Diagnosis Date  . Anxiety   . EDS (Ehlers-Danlos syndrome)    EDS 3  . Migraine with aura   . Ovarian cyst   . Rotavirus infection 1/25 - 07/22/02   Hospitalized  . Syncope     Past Surgical History:  Procedure Laterality Date  . DILATION AND EVACUATION N/A 04/18/2016   Procedure: DILATATION AND EVACUATION;  Surgeon: Hildred LaserAnika Cherry, MD;  Location: [facility] ORS;  Service: Gynecology;  Laterality: N/A;  . 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 Febrile seizure  7/99   [facility] - w/u negative  . MRI - head     NML    Current Outpatient Prescriptions  Medication Sig Dispense Refill  . acyclovir (ZOVIRAX) 400 MG tablet TAKE 1 TABLET (400 MG TOTAL) BY MOUTH QD PRN.    Marland Kitchen. ibuprofen (ADVIL,MOTRIN) 800 MG tablet Take 1 tablet (800 mg total) by mouth every 8 (eight) hours as needed. 30 tablet 1  . norethindrone (MICRONOR,CAMILA,ERRIN) 0.35 MG tablet Take 1 tablet (0.35 mg total) by mouth daily. 1 Package 11   No current facility-administered medications for this visit.     Allergies  Allergen Reactions  . Shellfish Allergy Anaphylaxis and Hives  . Lexapro [Escitalopram Oxalate] Other  (See Comments)    GI upset.       Review of Systems negative except from HPI and PMH  Physical Exam BP 117/80 (BP Location: Right Arm, Patient Position: Sitting, Cuff Size: Normal)   Pulse 95   Ht 5\' 8"  (1.727 m)   Wt 119 lb (54 kg)   BMI 18.09 kg/m  Well developed and well nourished in no acute distress HENT normal E scleral and icterus clear Neck Supple JVP flat; carotids brisk and full Clear to ausculation  Regular rate and rhythm, no murmurs gallops or rub Soft with active bowel sounds No clubbing cyanosis  Edema Alert and oriented, grossly normal motor and sensory function Skin Warm and Dry  ECG demonstrates sinus rhythm at sinus at 95 Intervals 05/02/34  Assessment and  Plan  1 dysautonomia  2-joint laxity probably Ehlers-Danlos-3  3-anxiety/depresson   We reviewed again the issues of dysautonomia, the physiology of orthstasis and positional stress as well as the additive factor of emotional stress.   We discussed the value of showering at night instead of the morning, raising the head of the bed 4-6 inches to address her major symptoms in the morning, increased hydration the morning.    There is a great deal of intercurrent psychosocial stressors. We will give her the name of [facility]. I will also be in touch with her primary care physician so they might  be able to further pursue pharmacological therapy  More than 50% of 45 min was spent in counseling related to the above

## 2016-05-11 NOTE — Patient Instructions (Signed)
Medication Instructions: - Your physician recommends that you continue on your current medications as directed. Please refer to the Current Medication list given to you today.  Labwork: -  None ordered  Procedures/Testing: - none ordered  Follow-Up: - Your physician wants you to follow-up in: May/ June 2018 with Dr. Graciela Husbands. You will receive a reminder letter in the mail two months in advance. If you don't receive a letter, please call our office to schedule the follow-up appointment.  Any Additional Special Instructions Will Be Listed Below (If Applicable).  - [employer] ([facility])  [phone]  - [facility])  [phone]  If you need a refill on your cardiac medications before your next appointment, please call your pharmacy.

## 2016-05-14 ENCOUNTER — Telehealth: Payer: Self-pay | Admitting: Family Medicine

## 2016-05-14 NOTE — Telephone Encounter (Signed)
Please call pt.  Saw notes from Dr. Graciela Husbands and the OB/gyn clinic.   Please offer f/u here (vs referral) re: mood f/u.   Thanks.

## 2016-05-15 NOTE — Telephone Encounter (Signed)
Left detailed message on voicemail.  

## 2016-05-22 NOTE — Telephone Encounter (Signed)
Patient scheduled appointment.

## 2016-05-24 ENCOUNTER — Encounter: Payer: Self-pay | Admitting: Family Medicine

## 2016-05-24 ENCOUNTER — Ambulatory Visit (INDEPENDENT_AMBULATORY_CARE_PROVIDER_SITE_OTHER): Payer: 59 | Admitting: Family Medicine

## 2016-05-24 DIAGNOSIS — F411 Generalized anxiety disorder: Secondary | ICD-10-CM | POA: Diagnosis not present

## 2016-05-24 MED ORDER — VENLAFAXINE HCL 37.5 MG PO TABS
37.5000 mg | ORAL_TABLET | Freq: Two times a day (BID) | ORAL | 1 refills | Status: DC
Start: 2016-05-24 — End: 2016-07-10

## 2016-05-24 NOTE — Progress Notes (Signed)
Pre visit review using our clinic review tool, if applicable. No additional management support is needed unless otherwise documented below in the visit note. 

## 2016-05-24 NOTE — Progress Notes (Signed)
Recent miscarriage, d/w pt.   On OCPs now.  She has had routine gynecologic care in the meantime.   Mood d/w pt.  Job change, miscarriage noted.  She moved in with her boyfriend.  She is safe at home. She has been trying to help her boyfriend get to work (single car situation).  "I've been worried about little stuff, but I have big stuff going on too."  No SI/HI.  She has noted more mood swings.  She has had more panic sx, episodes where she'll get shaky and worried, her chest will get tight.  She didn't tolerate lexapro prev.  D/w pt about counseling- prev offered.  She didn't want to go to group therapy.  She is on OCPs.  She was prev dx'd with ADD years ago.  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    Meds, vitals, and allergies reviewed.   ROS: Per HPI unless specifically indicated in ROS section   Tearful but no apparent distress otherwise. Regains composure. Normocephalic/atraumatic Mucous membranes moist Heart is regular Lungs are clear  Extremities without edema.

## 2016-05-24 NOTE — Patient Instructions (Signed)
Try taking venlafaxine once a day for now.   If tolerated then increase to twice a day after 1 week.  Update me as needed.  Take care.  Glad to see you.

## 2016-05-25 NOTE — Assessment & Plan Note (Signed)
She has noted life stressors with previous miscarriage, job change, financial difficulties. Discussed with patient about options. Okay for outpatient follow-up. No suicidal intent. No homicidal intent. She did not tolerate Lexapro treatment previously. Declined counseling at this point. It is likely that she is at the point where she needs extra help with her mood, is willing to start another medication to try to help with anxiety. Reasonable to try venlafaxine to see if she can tolerate it. Start 37.5 mg a day. If tolerated for 1 week she can increase to twice a day dosing. If she does not tolerate the medication that she will let me know. She agrees with plan.>25 minutes spent in face to face time with patient, >50% spent in counselling or coordination of care

## 2016-07-03 NOTE — Telephone Encounter (Deleted)
error 

## 2016-07-10 ENCOUNTER — Ambulatory Visit (INDEPENDENT_AMBULATORY_CARE_PROVIDER_SITE_OTHER): Payer: 59 | Admitting: Family Medicine

## 2016-07-10 ENCOUNTER — Ambulatory Visit: Payer: 59 | Admitting: Primary Care

## 2016-07-10 VITALS — HR 117 | Temp 98.2°F | Ht 68.0 in | Wt 116.0 lb

## 2016-07-10 DIAGNOSIS — M791 Myalgia, unspecified site: Secondary | ICD-10-CM

## 2016-07-10 DIAGNOSIS — B349 Viral infection, unspecified: Secondary | ICD-10-CM | POA: Diagnosis not present

## 2016-07-10 LAB — POC INFLUENZA A&B (BINAX/QUICKVUE)
Influenza A, POC: NEGATIVE
Influenza B, POC: NEGATIVE

## 2016-07-10 NOTE — Patient Instructions (Signed)
Viral Illness, Adult Viruses are tiny germs that can get into a person's body and cause illness. There are many different types of viruses, and they cause many types of illness. Viral illnesses can range from mild to severe. They can affect various parts of the body. Common illnesses that are caused by a virus include colds and the flu. Viral illnesses also include serious conditions such as HIV/AIDS (human immunodeficiency virus/acquired immunodeficiency syndrome). A few viruses have been linked to certain cancers. What are the causes? Many types of viruses can cause illness. Viruses invade cells in your body, multiply, and cause the infected cells to malfunction or die. When the cell dies, it releases more of the virus. When this happens, you develop symptoms of the illness, and the virus continues to spread to other cells. If the virus takes over the function of the cell, it can cause the cell to divide and grow out of control, as is the case when a virus causes cancer. Different viruses get into the body in different ways. You can get a virus by:  Swallowing food or water that is contaminated with the virus.  Breathing in droplets that have been coughed or sneezed into the air by an infected person.  Touching a surface that has been contaminated with the virus and then touching your eyes, nose, or mouth.  Being bitten by an insect or animal that carries the virus.  Having sexual contact with a person who is infected with the virus.  Being exposed to blood or fluids that contain the virus, either through an open cut or during a transfusion. If a virus enters your body, your body's defense system (immune system) will try to fight the virus. You may be at higher risk for a viral illness if your immune system is weak. What are the signs or symptoms? Symptoms vary depending on the type of virus and the location of the cells that it invades. Common symptoms of the main types of viral illnesses  include: Cold and flu viruses   Fever.  Headache.  Sore throat.  Muscle aches.  Nasal congestion.  Cough. Digestive system (gastrointestinal) viruses   Fever.  Abdominal pain.  Nausea.  Diarrhea. Liver viruses (hepatitis)   Loss of appetite.  Tiredness.  Yellowing of the skin (jaundice). Brain and spinal cord viruses   Fever.  Headache.  Stiff neck.  Nausea and vomiting.  Confusion or sleepiness. Skin viruses   Warts.  Itching.  Rash. Sexually transmitted viruses   Discharge.  Swelling.  Redness.  Rash. How is this treated? Viruses can be difficult to treat because they live within cells. Antibiotic medicines do not treat viruses because these drugs do not get inside cells. Treatment for a viral illness may include:  Resting and drinking plenty of fluids.  Medicines to relieve symptoms. These can include over-the-counter medicine for pain and fever, medicines for cough or congestion, and medicines to relieve diarrhea.  Antiviral medicines. These drugs are available only for certain types of viruses. They may help reduce flu symptoms if taken early. There are also many antiviral medicines for hepatitis and HIV/AIDS. Some viral illnesses can be prevented with vaccinations. A common example is the flu shot. Follow these instructions at home: Medicines    Take over-the-counter and prescription medicines only as told by your health care provider.  If you were prescribed an antiviral medicine, take it as told by your health care provider. Do not stop taking the medicine even if you start to   feel better.  Be aware of when antibiotics are needed and when they are not needed. Antibiotics do not treat viruses. If your health care provider thinks that you may have a bacterial infection as well as a viral infection, you may get an antibiotic.  Do not ask for an antibiotic prescription if you have been diagnosed with a viral illness. That will not make  your illness go away faster.  Frequently taking antibiotics when they are not needed can lead to antibiotic resistance. When this develops, the medicine no longer works against the bacteria that it normally fights. General instructions   Drink enough fluids to keep your urine clear or pale yellow.  Rest as much as possible.  Return to your normal activities as told by your health care provider. Ask your health care provider what activities are safe for you.  Keep all follow-up visits as told by your health care provider. This is important. How is this prevented? Take these actions to reduce your risk of viral infection:  Eat a healthy diet and get enough rest.  Wash your hands often with soap and water. This is especially important when you are in public places. If soap and water are not available, use hand sanitizer.  Avoid close contact with friends and family who have a viral illness.  If you travel to areas where viral gastrointestinal infection is common, avoid drinking water or eating raw food.  Keep your immunizations up to date. Get a flu shot every year as told by your health care provider.  Do not share toothbrushes, nail clippers, razors, or needles with other people.  Always practice safe sex. Contact a health care provider if:  You have symptoms of a viral illness that do not go away.  Your symptoms come back after going away.  Your symptoms get worse. Get help right away if:  You have trouble breathing.  You have a severe headache or a stiff neck.  You have severe vomiting or abdominal pain. This information is not intended to replace advice given to you by your health care provider. Make sure you discuss any questions you have with your health care provider. Document Released: 10/22/2015 Document Revised: 11/24/2015 Document Reviewed: 10/22/2015 Elsevier Interactive Patient Education  2017 Elsevier Inc.  

## 2016-07-10 NOTE — Progress Notes (Signed)
Subjective:     Patient ID: [name], female   DOB: 01-12-1996, 21 y.o.   MRN: [id number]  HPI Acute visit. Patient seen with onset around 3 AM this morning of low-grade fever 100.6, cough, nausea without vomiting, nasal congestion, headaches, mild myalgias. She was around a couple of family members about a week ago that may have had flu. She's taken some ibuprofen which helped somewhat. Denies any wheezing. No skin rash. No dyspnea.  Past Medical History:  Diagnosis Date  . Anxiety   . EDS (Ehlers-Danlos syndrome)    EDS 3  . Migraine with aura   . Ovarian cyst   . Rotavirus infection 1/25 - 07/22/02   Hospitalized  . Syncope    Past Surgical History:  Procedure Laterality Date  . DILATION AND EVACUATION N/A 04/18/2016   Procedure: DILATATION AND EVACUATION;  Surgeon: Hildred Laser, MD;  Location: [facility] ORS;  Service: Gynecology;  Laterality: N/A;  . 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 Febrile seizure  7/99   [facility] - w/u negative  . MRI - head     NML    reports that she has been smoking Cigarettes.  She has a 1.50 pack-year smoking history. She has never used smokeless tobacco. She reports that she does not drink alcohol or use drugs. family history includes Cancer in her other; Diabetes in her other; Thyroid disease in her mother. Allergies  Allergen Reactions  . Shellfish Allergy Anaphylaxis and Hives  . Lexapro [Escitalopram Oxalate] Other (See Comments)    GI upset.      Review of Systems  Constitutional: Positive for chills, fatigue and fever.  HENT: Positive for sore throat.   Respiratory: Positive for cough.   Gastrointestinal: Negative for diarrhea.  Skin: Negative for rash.  Neurological: Positive for headaches.       Objective:   Physical Exam  Constitutional: She appears well-developed and well-nourished.  HENT:  Right Ear: External ear normal.  Left Ear: External ear normal.  Mouth/Throat: Oropharynx is clear and moist.  Neck: Neck supple.   Cardiovascular: Normal rate and regular rhythm.   Pulmonary/Chest: Effort normal and breath sounds normal. No respiratory distress. She has no wheezes. She has no rales.  Lymphadenopathy:    She has no cervical adenopathy.  Skin: No rash noted.       Assessment:     Probable viral syndrome. Influenza screen negative. Patient nontoxic in appearance    Plan:     -Stay well-hydrated -Continue Motrin or Tylenol as needed for body aches -Follow-up for any persistent or worsening symptoms -Work note was written from today through 07/12/16  Kristian Covey MD  Primary Care at [facility]

## 2016-08-21 NOTE — Telephone Encounter (Deleted)
error 

## 2016-09-20 ENCOUNTER — Telehealth: Payer: Self-pay

## 2016-09-20 NOTE — Telephone Encounter (Signed)
PLEASE NOTE: All timestamps contained within this report are represented as [place] Standard Time. CONFIDENTIALTY NOTICE: This fax transmission is intended only for the addressee. It contains information that is legally privileged, confidential or otherwise protected from use or disclosure. If you are not the intended recipient, you are strictly prohibited from reviewing, disclosing, copying using or disseminating any of this information or taking any action in reliance on or regarding this information. If you have received this fax in error, please notify us immediately by telephone so that we can arrange for its return to [place]. Phone: [phone], Toll-Free: [phone], Fax: [phone] Page: 1 of 2 Call Id: [id number] [facility] Night - Client TELEPHONE ADVICE RECORD [facility] Medical Call Center Patient Name: Monique Underwood Gender: Female DOB: Jun 07, 1996 Age: 21 Y 8 M 9 D Return Phone Number: [phone] (Primary) City/State/Zip: [place] Client [facility] Night - Client Client Site [facility] - Night Physician Raechel Ache - MD Who Is Calling Patient / Member / Family / Caregiver Call Type Triage / Clinical Relationship To Patient Self Return Phone Number [phone] (Primary) Chief Complaint Vaginal Discharge Reason for Call Symptomatic / Request for Health Information Initial Comment Caller states she thinks (knows) she has a yeast infection and requests Diflucan pill be called in. Nurse Assessment Nurse: Talbert Forest, RN, Rinaldo Cloud Date/Time Lamount Cohen Time): 09/19/2016 5:46:58 PM Confirm and document reason for call. If symptomatic, describe symptoms. ---Caller states she thinks (knows) she has a yeast infection and requests Diflucan pill be called in. Does the PT have any chronic conditions? (i.e. diabetes, asthma, etc.) ---No Is the patient pregnant or possibly pregnant? (Ask all females between the ages of  41-55) ---No Guidelines Guideline Title Affirmed Question Vaginal Discharge Bad smelling vaginal discharge Disp. Time Lamount Cohen Time) Disposition Final User 09/19/2016 5:54:10 PM See PCP When Office is Open (within 3 days) Yes Talbert Forest, RN, [facility] Advice Given Per Guideline SEE PCP WITHIN 3 DAYS: NOTE TO TRIAGER - STD/STI PREVENTION: * Tell patient to either (1) not have sexual intercourse or (2) be sure the partner uses a condom until the problem has been diagnosed and treated. * Keep your genital area dry. Wear cotton underwear or underwear with a cotton crotch. * Keep your genital area clean. Wash daily. * Do not douche * Do not use feminine hygiene products * Avoid prolonged wearing of pantyhose, tight jeans * DO NOT DOUCHE. Douching does not help vaginitis, and may actually make it worse. CALL BACK IF: * Fever or abdominal pain occur * You become worse. CARE ADVICE given per Vaginal Discharge (Adult) guideline. Standing Orders Preparation Additional Instructions Route Frequency Duration Nurse Comments User Name Diflucan 150 mg 1 tablet Times 1 dose only Oral Talbert Forest, RN, Rinaldo Cloud PLEASE NOTE: All timestamps contained within this report are represented as [place] Standard Time. CONFIDENTIALTY NOTICE: This fax transmission is intended only for the addressee. It contains information that is legally privileged, confidential or otherwise protected from use or disclosure. If you are not the intended recipient, you are strictly prohibited from reviewing, disclosing, copying using or disseminating any of this information or taking any action in reliance on or regarding this information. If you have received this fax in error, please notify us immediately by telephone so that we can arrange for its return to [place]. Phone: [phone], Toll-Free: [phone], Fax: [phone] Page: 2 of 2 Call Id: [id number] [facility] [address] [place], [place]  [address] [phone]  [phone] Fax: [phone] MEDICATION ORDER [facility] Night - Client [facility] - Night Date: 09/19/2016 From: QI Department To: Raechel Ache - MD This is an approved standing order given by our call center nurse on your behalf. Thank you. Date Lamount Cohen Time): 09/19/2016 5:42:50 PM Triage RN: Julaine Fusi, RN NAME: Forestine Na PHONE NUMBER: [phone] (Primary) BIRTHDATE: 13-Jan-1996 ADDRESS: CITY/STATE/ZIP:  CALLER: Self NAME: Rx Given Preparation Additional Instructions Route Frequency Duration Nurse Comments User Name Diflucan 150 mg 1 tablet Times 1 dose only Oral Talbert Forest, RN, Rinaldo Cloud No signature is required on standing orders.

## 2016-09-20 NOTE — Telephone Encounter (Signed)
Can you verify that diflucan was sent in?  It isn't listed under med tab in EMR.  Thanks.

## 2016-09-20 NOTE — Telephone Encounter (Signed)
Spoke to Monique Underwood at [facility] and was advised that the patient picked the Diflucan up yesterday.

## 2016-09-29 ENCOUNTER — Other Ambulatory Visit: Payer: Self-pay

## 2016-09-29 DIAGNOSIS — Z3041 Encounter for surveillance of contraceptive pills: Secondary | ICD-10-CM

## 2016-09-29 MED ORDER — NORETHINDRONE 0.35 MG PO TABS: 1.0000 | ORAL_TABLET | Freq: Every day | ORAL | 2 refills | Status: DC

## 2016-10-10 ENCOUNTER — Encounter: Payer: Self-pay | Admitting: Family Medicine

## 2016-10-10 ENCOUNTER — Ambulatory Visit (INDEPENDENT_AMBULATORY_CARE_PROVIDER_SITE_OTHER): Payer: 59 | Admitting: Obstetrics and Gynecology

## 2016-10-10 ENCOUNTER — Encounter: Payer: Self-pay | Admitting: Obstetrics and Gynecology

## 2016-10-10 ENCOUNTER — Encounter: Payer: 59 | Admitting: Obstetrics and Gynecology

## 2016-10-10 ENCOUNTER — Ambulatory Visit (INDEPENDENT_AMBULATORY_CARE_PROVIDER_SITE_OTHER): Payer: 59 | Admitting: Family Medicine

## 2016-10-10 VITALS — BP 101/62 | HR 84 | Ht 68.0 in | Wt 118.1 lb

## 2016-10-10 VITALS — BP 92/52 | HR 84 | Temp 98.4°F | Ht 68.0 in | Wt 116.8 lb

## 2016-10-10 DIAGNOSIS — B9689 Other specified bacterial agents as the cause of diseases classified elsewhere: Secondary | ICD-10-CM | POA: Diagnosis not present

## 2016-10-10 DIAGNOSIS — F411 Generalized anxiety disorder: Secondary | ICD-10-CM

## 2016-10-10 DIAGNOSIS — N921 Excessive and frequent menstruation with irregular cycle: Secondary | ICD-10-CM

## 2016-10-10 DIAGNOSIS — Z0001 Encounter for general adult medical examination with abnormal findings: Secondary | ICD-10-CM

## 2016-10-10 DIAGNOSIS — Z7251 High risk heterosexual behavior: Secondary | ICD-10-CM

## 2016-10-10 DIAGNOSIS — F419 Anxiety disorder, unspecified: Secondary | ICD-10-CM | POA: Diagnosis not present

## 2016-10-10 DIAGNOSIS — Z862 Personal history of diseases of the blood and blood-forming organs and certain disorders involving the immune mechanism: Secondary | ICD-10-CM

## 2016-10-10 DIAGNOSIS — N760 Acute vaginitis: Secondary | ICD-10-CM

## 2016-10-10 MED ORDER — METRONIDAZOLE 500 MG PO TABS: 500.0000 mg | ORAL_TABLET | Freq: Two times a day (BID) | ORAL | 0 refills | Status: AC

## 2016-10-10 MED ORDER — DESOGESTREL-ETHINYL ESTRADIOL 0.15-0.02/0.01 MG (21/5) PO TABS: 1.0000 | ORAL_TABLET | Freq: Every day | ORAL | 0 refills | Status: DC

## 2016-10-10 NOTE — Progress Notes (Signed)
Saw OBGYN today.  She is starting new OCPs tomorrow.  Has rx for BV.    Living will d/w pt.  She'll consider.    Flu encouraged.  tdap d/w pt.   Diet and exercise d/w pt.  D/w pt about drinking more water.  Exercise is minimal partly from cardiac concerns.  She couldn't warm up at the gym w/o getting elevated heart rate.  She didn't want to "push" it, d/w pt.  Letter done for patient. Discussed with patient about drinking adequate fluids and increasing salt intake  Mood d/w pt.  "Okay" per patient.  Off tx at this point.  She tried melatonin in the past to try to help with sleep.  Had some difficultly sleeping, tried reading at night.  She was clearly anxious in the past, that has been some better since her job and home situation improved.  She still has some difficulty with unplanned upheavals.    She has noted reddish/bluish changes in the hands and feet, this is been ongoing for years.  h/o anemia noted. Due for recheck. See notes on labs.  PMH and SH reviewed  ROS: Per HPI unless specifically indicated in ROS section   Meds, vitals, and allergies reviewed.   GEN: nad, alert and oriented, tearful but regains composure HEENT: mucous membranes moist NECK: supple w/o LA CV: rrr.  PULM: ctab, no inc wob ABD: soft, +bs EXT: no edema SKIN: no acute rash but her hands do have a reddish/pinkish tint noted along the fingers both dorsal and palmar side.

## 2016-10-10 NOTE — Progress Notes (Signed)
HPI:      [name] is a 21 y.o. G1P0010 who LMP was Patient's last menstrual period was 09/28/2016 (exact date).  Subjective:   She presents today with 2 issues. The first is that she has concerns regarding her sexual partner. He has discussed symptoms that he has been having with itching and he now pain. She has been having vaginal discharge with odor and has tried antifungal's without success. She is understandably concerned that she could possibly have an STD. Her partner has not been tested. She denies any history of STDs.  She also complains of having long menses and sometimes to menstrual periods per month despite taking OCPs as directed. She would like to continue OCPs however she is unhappy with this irregular bleeding.    Hx: The following portions of the patient's history were reviewed and updated as appropriate:              She  has a past medical history of Anxiety; EDS (Ehlers-Danlos syndrome); Migraine with aura; Ovarian cyst; Rotavirus infection (1/25 - 07/22/02); and Syncope. She  does not have any pertinent problems on file. She  has a past surgical history that includes Febrile seizure (7/99); MRI - head; EEG (7/99); and Dilation and evacuation (N/A, 04/18/2016). Her family history includes Cancer in her other; Diabetes in her other; Thyroid disease in her mother. She  reports that she has been smoking Cigarettes.  She has a 1.50 pack-year smoking history. She has never used smokeless tobacco. She reports that she does not drink alcohol or use drugs. Current Outpatient Prescriptions on File Prior to Visit  Medication Sig Dispense Refill  . acyclovir (ZOVIRAX) 400 MG tablet TAKE 1 TABLET (400 MG TOTAL) BY MOUTH QD PRN.    . norethindrone (MICRONOR,CAMILA,ERRIN) 0.35 MG tablet Take 1 tablet (0.35 mg total) by mouth daily. 3 Package 2   No current facility-administered medications on file prior to visit.          Review of Systems:  Review of  Systems  Constitutional: Denied constitutional symptoms, night sweats, recent illness, fatigue, fever, insomnia and weight loss.  Eyes: Denied eye symptoms, eye pain, photophobia, vision change and visual disturbance.  Ears/Nose/Throat/Neck: Denied ear, nose, throat or neck symptoms, hearing loss, nasal discharge, sinus congestion and sore throat.  Cardiovascular: Denied cardiovascular symptoms, arrhythmia, chest pain/pressure, edema, exercise intolerance, orthopnea and palpitations.  Respiratory: Denied pulmonary symptoms, asthma, pleuritic pain, productive sputum, cough, dyspnea and wheezing.  Gastrointestinal: Denied, gastro-esophageal reflux, melena, nausea and vomiting.  Genitourinary: See HPI for additional information.  Musculoskeletal: Denied musculoskeletal symptoms, stiffness, swelling, muscle weakness and myalgia.  Dermatologic: Denied dermatology symptoms, rash and scar.  Neurologic: Denied neurology symptoms, dizziness, headache, neck pain and syncope.  Psychiatric: Denied psychiatric symptoms, anxiety and depression.  Endocrine: Denied endocrine symptoms including hot flashes and night sweats.   Meds:   Current Outpatient Prescriptions on File Prior to Visit  Medication Sig Dispense Refill  . acyclovir (ZOVIRAX) 400 MG tablet TAKE 1 TABLET (400 MG TOTAL) BY MOUTH QD PRN.    . norethindrone (MICRONOR,CAMILA,ERRIN) 0.35 MG tablet Take 1 tablet (0.35 mg total) by mouth daily. 3 Package 2   No current facility-administered medications on file prior to visit.     Objective:     Vitals:   10/10/16 1138  BP: 101/62  Pulse: 84              Physical examination   Pelvic:   Vulva: Normal appearance.  No lesions.  Vagina: No lesions or abnormalities noted.  Increased vaginal discharge noted   Support: Normal pelvic support.  Urethra No masses tenderness or scarring.  Meatus Normal size without lesions or prolapse.  Cervix: Normal appearance.  No lesions.  Anus: Normal  exam.  No lesions.  Perineum: Normal exam.  No lesions.        Bimanual   Uterus: Normal size.  Non-tender.  Mobile.  AV.  Adnexae: No masses.  Non-tender to palpation.  Cul-de-sac: Negative for abnormality.   WET PREP: clue cells: present, KOH (yeast): negative, odor: present and trichomoniasis: negative Ph:  > 4.5   Assessment:    G1P0010 Patient Active Problem List   Diagnosis Date Noted  . SVT (supraventricular tachycardia) (HCC) 01/14/2015  . Racing heart beat 12/17/2014  . Syncope 12/11/2014  . Anxiety state 12/11/2014  . Routine general medical examination at a health care facility 01/17/2013  . ADD (attention deficit disorder) 06/24/2012  . FEBRILE SEIZURE, PROLONGED 10/29/2009     1. Bacterial vulvovaginitis   2. High risk sexual behavior   3. Breakthrough bleeding on OCPs     Patient has BV on wet prep.  Because of partner concerns we will also test for STDs.     Plan:            1.  Flagyl for BV. Use of acidophilus/probiotic discussed.  2.  GC/CT performed  3.  Change OCPs for better cycle control - Mircette.   Orders Orders Placed This Encounter  Procedures  . GC/Chlamydia Probe Amp     Meds ordered this encounter  Medications  . metroNIDAZOLE (FLAGYL) 500 MG tablet    Sig: Take 1 tablet (500 mg total) by mouth 2 (two) times daily.    Dispense:  14 tablet    Refill:  0  . desogestrel-ethinyl estradiol (MIRCETTE) 0.15-0.02/0.01 MG (21/5) tablet    Sig: Take 1 tablet by mouth at bedtime.    Dispense:  3 Package    Refill:  0        F/U  Return in about 3 months (around 01/09/2017).  Elonda Husky, M.D. 10/10/2016 12:28 PM

## 2016-10-10 NOTE — Patient Instructions (Signed)
Update me as needed.  Take care.  Glad to see you.   

## 2016-10-10 NOTE — Progress Notes (Signed)
Pre visit review using our clinic review tool, if applicable. No additional management support is needed unless otherwise documented below in the visit note. 

## 2016-10-11 LAB — CBC WITH DIFFERENTIAL/PLATELET
Basophils Absolute: 0.1 10*3/uL (ref 0.0–0.1)
Basophils Relative: 1.2 % (ref 0.0–3.0)
EOS PCT: 1 % (ref 0.0–5.0)
Eosinophils Absolute: 0.1 10*3/uL (ref 0.0–0.7)
HCT: 43.1 % (ref 36.0–46.0)
Hemoglobin: 14.5 g/dL (ref 12.0–15.0)
LYMPHS ABS: 4 10*3/uL (ref 0.7–4.0)
Lymphocytes Relative: 39.8 % (ref 12.0–46.0)
MCHC: 33.7 g/dL (ref 30.0–36.0)
MCV: 92.5 fl (ref 78.0–100.0)
MONO ABS: 0.6 10*3/uL (ref 0.1–1.0)
Monocytes Relative: 6.4 % (ref 3.0–12.0)
NEUTROS PCT: 51.6 % (ref 43.0–77.0)
Neutro Abs: 5.2 10*3/uL (ref 1.4–7.7)
Platelets: 261 10*3/uL (ref 150.0–400.0)
RBC: 4.66 Mil/uL (ref 3.87–5.11)
RDW: 13.5 % (ref 11.5–14.6)
WBC: 10.1 10*3/uL (ref 4.5–10.5)

## 2016-10-11 NOTE — Assessment & Plan Note (Signed)
Saw OBGYN today.  She is starting new OCPs tomorrow.  Has rx for BV.   Living will d/w pt.  She'll consider.   Flu encouraged.  tdap d/w pt.  Not due for mammogram, etc. Discussed with patient.

## 2016-10-11 NOTE — Assessment & Plan Note (Signed)
This likely makes the tachycardia worse. The tachycardia likely makes her anxiety worse. Discussed with patient about all this. She may benefit from counseling but she was not enthused about going and likely would have difficulty getting his set up giving her current transportation situation. She is sharing a car with her boyfriend. I will check with cardiology to see if it is reasonable for her to use a when necessary beta blocker.

## 2016-10-12 LAB — GC/CHLAMYDIA PROBE AMP
Chlamydia trachomatis, NAA: POSITIVE — AB
Neisseria gonorrhoeae by PCR: NEGATIVE

## 2016-10-13 ENCOUNTER — Telehealth: Payer: Self-pay

## 2016-10-13 NOTE — Telephone Encounter (Signed)
-----   Message from Linzie Collin, MD sent at 10/12/2016  8:15 AM EDT ----- Based on these results, please have her schedule an appointment.

## 2016-10-13 NOTE — Telephone Encounter (Signed)
Mychart message sent.

## 2016-10-17 ENCOUNTER — Telehealth: Payer: Self-pay

## 2016-10-17 NOTE — Telephone Encounter (Signed)
Pt said she was seen by GYN and was told had chlamydia; pt indicated she has not gotten cb from GYN office. Pt request Dr Para March treat the Chlamydia; I asked pt if she went on my chart; pt said her mom usually does that. I advised pt she needs to call Dr Logan Bores office for appt. Pt said what do I do until then. I advised pt when calls for appt at Dr Logan Bores office to ask that question. Pt voiced understanding. FYI to Dr Para March.

## 2016-10-17 NOTE — Telephone Encounter (Signed)
Spoke with pt and appt made

## 2016-10-17 NOTE — Telephone Encounter (Signed)
-----   Message from Linzie Collin, MD sent at 10/12/2016  8:15 AM EDT ----- Based on these results, please have her schedule an appointment.

## 2016-10-18 ENCOUNTER — Encounter: Payer: Self-pay | Admitting: Obstetrics and Gynecology

## 2016-10-18 ENCOUNTER — Ambulatory Visit (INDEPENDENT_AMBULATORY_CARE_PROVIDER_SITE_OTHER): Payer: 59 | Admitting: Obstetrics and Gynecology

## 2016-10-18 VITALS — BP 121/70 | HR 91 | Ht 68.0 in | Wt 116.6 lb

## 2016-10-18 DIAGNOSIS — A749 Chlamydial infection, unspecified: Secondary | ICD-10-CM

## 2016-10-18 MED ORDER — AZITHROMYCIN 500 MG PO TABS: 1000.0000 mg | ORAL_TABLET | Freq: Once | ORAL | 0 refills | Status: AC

## 2016-10-18 NOTE — Progress Notes (Signed)
HPI:      [name] is a 21 y.o. G1P0010 who LMP was Patient's last menstrual period was 09/28/2016 (exact date).  Subjective:   She presents today After being diagnosed with Chlamydia vaginitis. She states that she only has one sexual partner and he has no symptoms at this time.     Hx: The following portions of the patient's history were reviewed and updated as appropriate:              She  has a past medical history of Anxiety; EDS (Ehlers-Danlos syndrome); Migraine with aura; Ovarian cyst; Rotavirus infection (1/25 - 07/22/02); and Syncope. She  does not have any pertinent problems on file. She  has a past surgical history that includes Febrile seizure (7/99); MRI - head; EEG (7/99); and Dilation and evacuation (N/A, 04/18/2016). Her family history includes Cancer in her other; Diabetes in her other; Thyroid disease in her mother. She  reports that she has been smoking Cigarettes.  She has a 1.50 pack-year smoking history. She has never used smokeless tobacco. She reports that she does not drink alcohol or use drugs. Current Outpatient Prescriptions on File Prior to Visit  Medication Sig Dispense Refill  . acyclovir (ZOVIRAX) 400 MG tablet TAKE 1 TABLET (400 MG TOTAL) BY MOUTH QD PRN.    Marland Kitchen desogestrel-ethinyl estradiol (MIRCETTE) 0.15-0.02/0.01 MG (21/5) tablet Take 1 tablet by mouth at bedtime. 3 Package 0   No current facility-administered medications on file prior to visit.          Review of Systems:  Review of Systems  Constitutional: Denied constitutional symptoms, night sweats, recent illness, fatigue, fever, insomnia and weight loss.  Eyes: Denied eye symptoms, eye pain, photophobia, vision change and visual disturbance.  Ears/Nose/Throat/Neck: Denied ear, nose, throat or neck symptoms, hearing loss, nasal discharge, sinus congestion and sore throat.  Cardiovascular: Denied cardiovascular symptoms, arrhythmia, chest pain/pressure, edema, exercise intolerance,  orthopnea and palpitations.  Respiratory: Denied pulmonary symptoms, asthma, pleuritic pain, productive sputum, cough, dyspnea and wheezing.  Gastrointestinal: Denied, gastro-esophageal reflux, melena, nausea and vomiting.  Genitourinary: Denied genitourinary symptoms including symptomatic vaginal discharge, pelvic relaxation issues, and urinary complaints.  Musculoskeletal: Denied musculoskeletal symptoms, stiffness, swelling, muscle weakness and myalgia.  Dermatologic: Denied dermatology symptoms, rash and scar.  Neurologic: Denied neurology symptoms, dizziness, headache, neck pain and syncope.  Psychiatric: Denied psychiatric symptoms, anxiety and depression.  Endocrine: Denied endocrine symptoms including hot flashes and night sweats.   Meds:   Current Outpatient Prescriptions on File Prior to Visit  Medication Sig Dispense Refill  . acyclovir (ZOVIRAX) 400 MG tablet TAKE 1 TABLET (400 MG TOTAL) BY MOUTH QD PRN.    Marland Kitchen desogestrel-ethinyl estradiol (MIRCETTE) 0.15-0.02/0.01 MG (21/5) tablet Take 1 tablet by mouth at bedtime. 3 Package 0   No current facility-administered medications on file prior to visit.     Objective:     Vitals:   10/18/16 0827  BP: 121/70  Pulse: 91             Positive CT   Assessment:    G1P0010 Patient Active Problem List   Diagnosis Date Noted  . SVT (supraventricular tachycardia) (HCC) 01/14/2015  . Racing heart beat 12/17/2014  . Syncope 12/11/2014  . Anxiety state 12/11/2014  . Encounter for general adult medical examination with abnormal findings 01/17/2013  . ADD (attention deficit disorder) 06/24/2012  . FEBRILE SEIZURE, PROLONGED 10/29/2009     1. Chlamydia        Plan:  1.  Zithromax  2.  Advised treatment of partners. Discussed timing of intercourse after treatments. Orders No orders of the defined types were placed in this encounter.    Meds ordered this encounter  Medications  . CAMILA 0.35 MG tablet     Sig: TAKE 1 TABLET (0.35 MG TOTAL) BY MOUTH DAILY.    Refill:  11  . azithromycin (ZITHROMAX) 500 MG tablet    Sig: Take 2 tablets (1,000 mg total) by mouth once.    Dispense:  2 tablet    Refill:  0        F/U  Return in about 6 weeks (around 11/29/2016). I spent 16 minutes with this patient of which greater than 50% was spent discussing Chlamydia, treatment of partners, timing of test of cure and necessity.  Elonda Husky, M.D. 10/18/2016 9:07 AM

## 2016-10-18 NOTE — Telephone Encounter (Signed)
Agreed. I will defer to the ordering M.D. Thanks.

## 2016-10-29 ENCOUNTER — Telehealth: Payer: Self-pay | Admitting: Family Medicine

## 2016-10-29 MED ORDER — METOPROLOL TARTRATE 25 MG PO TABS: 12.5000 mg | ORAL_TABLET | Freq: Two times a day (BID) | ORAL | 1 refills | Status: DC | PRN

## 2016-10-29 NOTE — Telephone Encounter (Signed)
Notify pt.  D/w Dr. Graciela Husbands.  Okay to use metoprolol prn but still likely the case that consistent salt and fluid intake will be more important.  rx sent.  Thanks.

## 2016-10-30 NOTE — Telephone Encounter (Signed)
Left detailed message on voicemail.  

## 2016-11-16 ENCOUNTER — Ambulatory Visit (INDEPENDENT_AMBULATORY_CARE_PROVIDER_SITE_OTHER): Payer: 59 | Admitting: Internal Medicine

## 2016-11-16 ENCOUNTER — Encounter: Payer: Self-pay | Admitting: Internal Medicine

## 2016-11-16 VITALS — BP 98/60 | HR 84 | Ht 68.0 in | Wt 117.5 lb

## 2016-11-16 DIAGNOSIS — G901 Familial dysautonomia [Riley-Day]: Secondary | ICD-10-CM

## 2016-11-16 DIAGNOSIS — G909 Disorder of the autonomic nervous system, unspecified: Secondary | ICD-10-CM | POA: Diagnosis not present

## 2016-11-16 NOTE — Patient Instructions (Signed)

## 2016-11-16 NOTE — Progress Notes (Signed)
      Patient Care Team: Joaquim Nam, MD as PCP - General (Family Medicine)   HPI  Monique Underwood is a 21 y.o. female Seen in followup for exertional palps consistent with sinus rhythm with other findings consistent with autonomic dysfunction incl hypotension, violaceous discoloration of lower extremites syncope  She was also noted to have joint laxity and arachnodactyly  Echo showed normal LV function and structure CT neg  She's had no interval syncope. She continues with some dizziness. She continues to shower in the morning. He is doing better with hydration.. Intake is also improved. She struggled with her periods. They are working on the right coronary contraceptive regime.  She remains quite stressed. She's been given a prescription for metoprolol which she has not yet started  She has a new job Past Medical History:  Diagnosis Date  . Anxiety   . EDS (Ehlers-Danlos syndrome)    EDS 3  . Migraine with aura   . Ovarian cyst   . Rotavirus infection 1/25 - 07/22/02   Hospitalized  . Syncope     Past Surgical History:  Procedure Laterality Date  . DILATION AND EVACUATION N/A 04/18/2016   Procedure: DILATATION AND EVACUATION;  Surgeon: Hildred Laser, MD;  Location: [facility] ORS;  Service: Gynecology;  Laterality: N/A;  . 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 Febrile seizure  7/99   [facility] - w/u negative  . MRI - head     NML    Current Outpatient Prescriptions  Medication Sig Dispense Refill  . acyclovir (ZOVIRAX) 400 MG tablet TAKE 1 TABLET (400 MG TOTAL) BY MOUTH QD PRN.    Marland Kitchen desogestrel-ethinyl estradiol (MIRCETTE) 0.15-0.02/0.01 MG (21/5) tablet Take 1 tablet by mouth at bedtime. 3 Package 0  . metoprolol tartrate (LOPRESSOR) 25 MG tablet Take 0.5-1 tablets (12.5-25 mg total) by mouth 2 (two) times daily as needed (for anxiety/elevated heart rate). 60 tablet 1   No current facility-administered medications for this visit.     Allergies  Allergen Reactions  .  Shellfish Allergy Anaphylaxis and Hives  . Lexapro [Escitalopram Oxalate] Other (See Comments)    GI upset.       Review of Systems negative except from HPI and PMH  Physical Exam BP 98/60 (BP Location: Left Arm, Patient Position: Sitting, Cuff Size: Normal)   Pulse 84   Ht 5\' 8"  (1.727 m)   Wt 117 lb 8 oz (53.3 kg)   BMI 17.87 kg/m  Well developed and nourished in no acute distress HENT normal Neck supple with JVP-flat Carotids brisk and full without bruits Clear Regular rate and rhythm, no murmurs or gallops Abd-soft with active BS without hepatomegaly No Clubbing cyanosis edema Skin-warm and dry A & Oriented  Grossly normal sensory and motor function Affect tearful  ECG demonstrates sinus at 84 intervals 13/07/36   Assessment and  Plan  1 dysautonomia  2-joint laxity probably Ehlers-Danlos-3  3-anxiety/depresson   We have reviewed the physiology. We reviewed the importance of salt and water repletion and the avoidance of heat. We have also discussed trying to deal with the psychosocial stress. She has been given the name for [facility] as well as for [facility].  I've encouraged her to try the metoprolol prescribed by Dr. Para March.  We spent more than 50% of our >25 min visit in face to face counseling regarding the above

## 2016-12-06 ENCOUNTER — Telehealth: Payer: Self-pay | Admitting: Obstetrics and Gynecology

## 2016-12-06 NOTE — Telephone Encounter (Signed)
Patient Monique Underwood and wants a call back to discuss medication issue  Please call - patient states that we haven't returned calls to her (??)

## 2016-12-07 MED ORDER — AZITHROMYCIN 500 MG PO TABS: 1000.0000 mg | ORAL_TABLET | Freq: Once | ORAL | 0 refills | Status: AC

## 2016-12-07 NOTE — Telephone Encounter (Signed)
This Clinical research associate spoke with pt- it is documented on 10/17/16 I spoke with patient and an appointment was made for the next day for her to be seen. Dr Logan Bores saw her and prescibed zithromycin. No further communication was made with this office. When I returned pts call this AM she stated that she didn't start the medicine because her partner did not get treated. After partner was given treatment pt states her filled prescription "was nowhere to be found". She states she called this office to get a refill and said refill was denied. She also states "[facility] called you and it was denied". She was very angry. This Clinical research associate called [facility] and spoke with Corrie Dandy who states she has no record of pt calling them for a refill nor does she have a denial for a refill from this office. I will contact pt with providers instructions. Another prescription for [facility]ithromycin will be sent to [facility] and an appointment was made with Dr Valentino Saxon per pt request for 6 week test of cure visit.

## 2016-12-20 ENCOUNTER — Encounter: Payer: Self-pay | Admitting: Medical Oncology

## 2016-12-20 ENCOUNTER — Telehealth: Payer: Self-pay | Admitting: Family Medicine

## 2016-12-20 ENCOUNTER — Emergency Department: Payer: 59

## 2016-12-20 ENCOUNTER — Emergency Department
Admission: EM | Admit: 2016-12-20 | Discharge: 2016-12-20 | Disposition: A | Discharge status: Home / Self Care | Payer: 59 | Attending: Student in an Organized Health Care Education/Training Program | Admitting: Student in an Organized Health Care Education/Training Program

## 2016-12-20 DIAGNOSIS — F17210 Nicotine dependence, cigarettes, uncomplicated: Secondary | ICD-10-CM | POA: Diagnosis not present

## 2016-12-20 DIAGNOSIS — R079 Chest pain, unspecified: Secondary | ICD-10-CM | POA: Insufficient documentation

## 2016-12-20 DIAGNOSIS — Z793 Long term (current) use of hormonal contraceptives: Secondary | ICD-10-CM | POA: Diagnosis not present

## 2016-12-20 HISTORY — DX: Orthostatic hypotension: I95.1

## 2016-12-20 HISTORY — DX: Other specified cardiac arrhythmias: I49.8

## 2016-12-20 HISTORY — DX: Postural orthostatic tachycardia syndrome (POTS): G90.A

## 2016-12-20 HISTORY — DX: Tachycardia, unspecified: R00.0

## 2016-12-20 LAB — BASIC METABOLIC PANEL
ANION GAP: 4 — AB (ref 5–15)
BUN: 8 mg/dL (ref 6–20)
CALCIUM: 8.7 mg/dL — AB (ref 8.9–10.3)
CO2: 23 mmol/L (ref 22–32)
Chloride: 107 mmol/L (ref 101–111)
Creatinine, Ser: 0.79 mg/dL (ref 0.44–1.00)
GFR calc Af Amer: >60 mL/min (ref >60)
GFR calc non Af Amer: >60 mL/min (ref >60)
GLUCOSE: 93 mg/dL (ref 65–99)
Potassium: 3.4 mmol/L — ABNORMAL LOW (ref 3.5–5.1)
Sodium: 134 mmol/L — ABNORMAL LOW (ref 135–145)

## 2016-12-20 LAB — CBC
HCT: 40 % (ref 35.0–47.0)
HEMOGLOBIN: 13.9 g/dL (ref 12.0–16.0)
MCH: 31.2 pg (ref 26.0–34.0)
MCHC: 34.7 g/dL (ref 32.0–36.0)
MCV: 90 fL (ref 80.0–100.0)
Platelets: 217 10*3/uL (ref 150–440)
RBC: 4.45 MIL/uL (ref 3.80–5.20)
RDW: 13 % (ref 11.5–14.5)
WBC: 9.2 10*3/uL (ref 3.6–11.0)

## 2016-12-20 LAB — TROPONIN I

## 2016-12-20 LAB — FIBRIN DERIVATIVES D-DIMER (ARMC ONLY): Fibrin derivatives D-dimer (ARMC): 141.75 (ref 0.00–499.00)

## 2016-12-20 MED ORDER — PROMETHAZINE HCL 12.5 MG PO TABS: 12.5000 mg | ORAL_TABLET | Freq: Four times a day (QID) | ORAL | 0 refills | Status: DC | PRN

## 2016-12-20 MED ORDER — LORAZEPAM 0.5 MG PO TABS: 0.5000 mg | ORAL_TABLET | Freq: Three times a day (TID) | ORAL | 0 refills | Status: DC | PRN

## 2016-12-20 MED ORDER — LORAZEPAM 1 MG PO TABS
1.0000 mg | ORAL_TABLET | Freq: Once | ORAL | Status: AC
  Administered 2016-12-20: 1 mg via ORAL
  Filled 2016-12-20: qty 1

## 2016-12-20 NOTE — ED Notes (Signed)
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

## 2016-12-20 NOTE — Telephone Encounter (Signed)
Patient Name: Monique Underwood  DOB: [birth year]    Initial Comment Caller states she is having a tight chest pain.    Nurse Assessment  Nurse: Sherilyn Cooter, RN, Thurmond Butts Date/Time Lamount Cohen Time): 12/20/2016 11:02:00 AM  Confirm and document reason for call. If symptomatic, describe symptoms. ---Caller states she has EDS3 for about 3 years which is a heart condition in which the heart beats faster and she has lower BP. She feels like a heavy pressure in her chest which began this morning. It has been something that comes and goes, but it is happening more frequently. She states that Tums only helps for a "moment." She has a burning sensation at present, and it "feels a little tight." She has a "little SOB" with this.  Does the patient have any new or worsening symptoms? ---Yes  Will a triage be completed? ---Yes  Related visit to physician within the last 2 weeks? ---No  Does the PT have any chronic conditions? (i.e. diabetes, asthma, etc.) ---Yes  List chronic conditions. ---EDS3, Hypotension  Is the patient pregnant or possibly pregnant? (Ask all females between the ages of 9-55) ---No  Is this a behavioral health or substance abuse call? ---No     Guidelines    Guideline Title Affirmed Question Affirmed Notes  Chest Pain [1] Chest pain lasts > 5 minutes AND [2] history of heart disease (i.e., heart attack, bypass surgery, angina, angioplasty, CHF; not just a heart murmur)    Final Disposition User   Call EMS 911 Now Sherilyn Cooter, RN, Thurmond Butts    Comments    Report given to [facility]. Verbalized understanding.   Disagree/Comply: Disagree  Disagree/Comply Reason: Disagree with instructions

## 2016-12-20 NOTE — ED Triage Notes (Signed)
Pt reports for the past month she has been having intermittent chest pain with sob. Pt reports hx of POTS. Pt in NAD. Talking in complete sentences

## 2016-12-20 NOTE — BH Assessment (Signed)
Per EDP, Dr. Roxan Hockey pt does not require TTS consult. EDP requests assistance with community resources. Clinician spoke with pt regarding c/o anxiety. Pt reports multiple ongoing stressors in the the home environment. Pt denies SI,HI, psychosis. Pt denies previous OPT treatment. Clinician provided pt with multiple OPT resources for anxiety therapists in her area that are listed as being in network with [facility]. Clinician also provided instructions on how to contact insurance panel for additional OPT referrals. Pt verbally agrees to f/u with outpatient resources.

## 2016-12-20 NOTE — ED Provider Notes (Signed)
[facility] Emergency Department Provider Note    First MD Initiated Contact with Patient 12/20/16 1312     (approximate)  I have reviewed the triage vital signs and the nursing notes.   HISTORY  Chief Complaint Chest Pain     HPI Monique Underwood is a 21 y.o. female with a history of E.E.S. as well as pots and history of anxiety presents with episode of chest pain that occurred while she was at work. Patient states that pain was midsternal without radiation. Divisum shortness of breath. Patient very tearful and in the ER room and states that she's been under a lot of stress recently. No measured fevers. Does smoke half pack cigarettes per day. No family history of sudden cardiac arrest. Denies any pain tearing or radiating through to her back. No numbness or tingling. Patient states that she's been strongly with anxiety for several years but her primary care doctor did not feel comfortable prescribing any medications due to concern for lowering her blood pressure with her history of pots. Patient is on oral contraceptive pills but denies any lower extremity swelling.    Past Medical History:  Diagnosis Date  . Anxiety   . EDS (Ehlers-Danlos syndrome)    EDS 3  . Migraine with aura   . Ovarian cyst   . POTS (postural orthostatic tachycardia syndrome)   . Rotavirus infection 1/25 - 07/22/02   Hospitalized  . Syncope    Family History  Problem Relation Age of Onset  . Thyroid disease Mother   . Diabetes Other   . Cancer Other    Past Surgical History:  Procedure Laterality Date  . DILATION AND EVACUATION N/A 04/18/2016   Procedure: DILATATION AND EVACUATION;  Surgeon: Hildred Laser, MD;  Location: [facility] ORS;  Service: Gynecology;  Laterality: N/A;  . 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 Febrile seizure  7/99   [facility] - w/u negative  . MRI - head     NML   Patient Active Problem List   Diagnosis Date Noted  . SVT (supraventricular tachycardia) (HCC)  01/14/2015  . Racing heart beat 12/17/2014  . Syncope 12/11/2014  . Anxiety state 12/11/2014  . Encounter for general adult medical examination with abnormal findings 01/17/2013  . ADD (attention deficit disorder) 06/24/2012  . FEBRILE SEIZURE, PROLONGED 10/29/2009      Prior to Admission medications   Medication Sig Start Date End Date Taking? Authorizing Provider  acyclovir (ZOVIRAX) 400 MG tablet TAKE 1 TABLET (400 MG TOTAL) BY MOUTH QD PRN. 06/15/15  Yes Joaquim Nam, MD  desogestrel-ethinyl estradiol (MIRCETTE) 0.15-0.02/0.01 MG (21/5) tablet Take 1 tablet by mouth at bedtime. 10/10/16  Yes Linzie Collin, MD  metoprolol tartrate (LOPRESSOR) 25 MG tablet Take 0.5-1 tablets (12.5-25 mg total) by mouth 2 (two) times daily as needed (for anxiety/elevated heart rate). 10/29/16  Yes Joaquim Nam, MD    Allergies Shellfish allergy and Lexapro [escitalopram oxalate]    Social History Social History  Substance Use Topics  . Smoking status: Current Every Day Smoker    Packs/day: 0.50    Years: 3.00    Types: Cigarettes  . Smokeless tobacco: Never Used     Comment: Parents doesn't know she smokes!!!!!  . Alcohol use No    Review of Systems Patient denies headaches, rhinorrhea, blurry vision, numbness, shortness of breath, chest pain, edema, cough, abdominal pain, nausea, vomiting, diarrhea, dysuria, fevers, rashes or hallucinations unless otherwise stated above  in HPI. ____________________________________________   PHYSICAL EXAM:  VITAL SIGNS: Vitals:   12/20/16 1500 12/20/16 1515  BP: 110/67 108/68  Pulse: 87 85  Resp: 16 17    Constitutional: Alert and oriented. Anxious and tearful but in no acute distress. Eyes: Conjunctivae are normal.  Head: Atraumatic. Nose: No congestion/rhinnorhea. Mouth/Throat: Mucous membranes are moist.   Neck: No stridor. Painless ROM.  Cardiovascular: Normal rate, regular rhythm. Grossly normal heart sounds.  Good peripheral  circulation. Respiratory: Normal respiratory effort.  No retractions. Lungs CTAB. Gastrointestinal: Soft and nontender. No distention. No abdominal bruits. No CVA tenderness. Musculoskeletal: No lower extremity tenderness nor edema.  No joint effusions.  + anterior mid sternal chest wall ttp. Neurologic:  Normal speech and language. No gross focal neurologic deficits are appreciated. No facial droop Skin:  Skin is warm, dry and intact. No rash noted. Psychiatric: Mood and affect are normal. Speech and behavior are normal.  ____________________________________________   LABS (all labs ordered are listed, but only abnormal results are displayed)  Results for orders placed or performed during the hospital encounter of 12/20/16 (from the past 24 hour(s))  Basic metabolic panel     Status: Abnormal   Collection Time: 12/20/16 11:53 AM  Result Value Ref Range   Sodium 134 (L) 135 - 145 mmol/L   Potassium 3.4 (L) 3.5 - 5.1 mmol/L   Chloride 107 101 - 111 mmol/L   CO2 23 22 - 32 mmol/L   Glucose, Bld 93 65 - 99 mg/dL   BUN 8 6 - 20 mg/dL   Creatinine, Ser 1.61 0.44 - 1.00 mg/dL   Calcium 8.7 (L) 8.9 - 10.3 mg/dL   GFR calc non Af Amer >60 >60 mL/min   GFR calc Af Amer >60 >60 mL/min   Anion gap 4 (L) 5 - 15  CBC     Status: None   Collection Time: 12/20/16 11:53 AM  Result Value Ref Range   WBC 9.2 3.6 - 11.0 K/uL   RBC 4.45 3.80 - 5.20 MIL/uL   Hemoglobin 13.9 12.0 - 16.0 g/dL   HCT 09.6 04.5 - 40.9 %   MCV 90.0 80.0 - 100.0 fL   MCH 31.2 26.0 - 34.0 pg   MCHC 34.7 32.0 - 36.0 g/dL   RDW 81.1 91.4 - 78.2 %   Platelets 217 150 - 440 K/uL  Troponin I     Status: None   Collection Time: 12/20/16 11:53 AM  Result Value Ref Range   Troponin I <0.03 <0.03 ng/mL   ____________________________________________  EKG My review and personal interpretation at Time: 11:48   Indication: chest pain  Rate: 115  Rhythm: sinus Axis: normal Other: non specific st changes, normal intervals, n o  wpw, brugada or hocm ____________________________________________  RADIOLOGY  I personally reviewed all radiographic images ordered to evaluate for the above acute complaints and reviewed radiology reports and findings.  These findings were personally discussed with the patient.  Please see medical record for radiology report.  ____________________________________________   PROCEDURES  Procedure(s) performed:  Procedures    Critical Care performed: no ____________________________________________   INITIAL IMPRESSION / ASSESSMENT AND PLAN / ED COURSE  Pertinent labs & imaging results that were available during my care of the patient were reviewed by me and considered in my medical decision making (see chart for details).  DDX: ACS, pericarditis, esophagitis, boerhaaves, pe, dissection, pna, bronchitis, costochondritis   ISIS COSTANZA is a 21 y.o. who presents to the ED with chest pain  and discomfort as described above. Patient arrives in no acute distress. Chest pain as described above.  EKG shows no acute dysrhythmia. Not consistent with ACS. Not clinically consistent with dissection. Her chest x-ray is normal. D-dimer is normal and patient is low risk by well's criteria. Patient does feel very stressed out at work and do suspect that there is some component of anxiety induced pain. She is no evidence of fall, there is no Brugada or WPW. I do feel patient is stable for follow-up with her PCP.      ____________________________________________   FINAL CLINICAL IMPRESSION(S) / ED DIAGNOSES  Final diagnoses:  Chest pain, unspecified type      NEW MEDICATIONS STARTED DURING THIS VISIT:  New Prescriptions   No medications on file     Note:  This document was prepared using Dragon voice recognition software and may include unintentional dictation errors.    Willy Eddy, MD 12/20/16 Jerene Bears

## 2016-12-20 NOTE — Telephone Encounter (Signed)
Wade with [employer] called and pt was advised to call 911 and go to ED for eval and pt declined to call 911. I spoke with pt and she has tightness and pressure in her chest along with a stabbing pain in the middle of her chest (pain level 5).  Pt said she does feel like her heart is racing but she cannot ck pulse to tell how fast actually beating. Pt has not called cardiologist; pt thinks she needs to go to ED. Pt said she will let her boss know that she is going to [facility] ED and pt said someone from work can drive her. FYI to Dr Para March.

## 2016-12-20 NOTE — Telephone Encounter (Signed)
Agree with ER.

## 2017-01-01 ENCOUNTER — Other Ambulatory Visit: Payer: Self-pay | Admitting: *Deleted

## 2017-01-01 MED ORDER — METOPROLOL TARTRATE 25 MG PO TABS: 12.5000 mg | ORAL_TABLET | Freq: Two times a day (BID) | ORAL | 1 refills | Status: DC | PRN

## 2017-01-01 NOTE — Telephone Encounter (Signed)
Faxed refill request. Metoprolol Last office visit:   10/10/16 Last Filled:    60 tablet 1 10/29/2016  This is prescribed "as needed" but it looks like it has been used twice daily.  Please advise.

## 2017-01-01 NOTE — Telephone Encounter (Signed)
Okay to continue, sent.  Thanks.  

## 2017-01-02 ENCOUNTER — Other Ambulatory Visit: Payer: Self-pay | Admitting: Family Medicine

## 2017-01-02 ENCOUNTER — Encounter: Payer: 59 | Admitting: Obstetrics and Gynecology

## 2017-01-04 DIAGNOSIS — Z8619 Personal history of other infectious and parasitic diseases: Secondary | ICD-10-CM

## 2017-01-04 HISTORY — DX: Personal history of other infectious and parasitic diseases: Z86.19

## 2017-01-08 ENCOUNTER — Telehealth: Payer: Self-pay | Admitting: Obstetrics and Gynecology

## 2017-01-08 DIAGNOSIS — N921 Excessive and frequent menstruation with irregular cycle: Secondary | ICD-10-CM

## 2017-01-08 MED ORDER — DESOGESTREL-ETHINYL ESTRADIOL 0.15-0.02/0.01 MG (21/5) PO TABS: 1.0000 | ORAL_TABLET | Freq: Every day | ORAL | 0 refills | Status: DC

## 2017-01-08 NOTE — Telephone Encounter (Signed)
Informed pt script was at pharmacy- states she has an appt with Dr Valentino Saxon on 01/30/17 and states the birth control is working- everything is back to normal.

## 2017-01-08 NOTE — Telephone Encounter (Signed)
Patient called wanting to speak with a nurse,  Patient stated that Dr. Logan Bores denied her refill for birth control. Patient also stated that today is her first day without birth control, and she needs it son so "anything wont get messed up. Patient stated that she is Dr. Oretha Milch patient and would like to have every thing switched back for personal reasons, Patient was informed that we have a 24 hr turn around time, and someone will contact her soon regarding the issue. P Lease advise.

## 2017-01-10 ENCOUNTER — Encounter: Payer: 59 | Admitting: Obstetrics and Gynecology

## 2017-01-26 ENCOUNTER — Encounter: Payer: 59 | Admitting: Obstetrics and Gynecology

## 2017-01-30 ENCOUNTER — Encounter: Payer: 59 | Admitting: Obstetrics and Gynecology

## 2017-02-07 ENCOUNTER — Ambulatory Visit (INDEPENDENT_AMBULATORY_CARE_PROVIDER_SITE_OTHER): Payer: 59 | Admitting: Obstetrics and Gynecology

## 2017-02-07 VITALS — BP 97/59 | HR 97 | Resp 16 | Wt 115.9 lb

## 2017-02-07 DIAGNOSIS — Z3009 Encounter for other general counseling and advice on contraception: Secondary | ICD-10-CM

## 2017-02-07 DIAGNOSIS — A749 Chlamydial infection, unspecified: Secondary | ICD-10-CM

## 2017-02-07 NOTE — Progress Notes (Signed)
    GYNECOLOGY PROGRESS NOTE  Subjective:    Patient ID: CHANETTA SPEHAR, female    DOB: 18-Feb-1996, 21 y.o.   MRN: [id number]  HPI  Patient is a 21 y.o. G84P0010 female who presents for [facility] for recent chlamydia infection.  Notes that she and partner have now been treated. Also desires to discuss contraception. Is currently on OCPs, but notes inconsistent use as she sometimes forgets to take them.  Also noting some irregularities with her period (likely due to missed pills). Would like to consider something more long-acting. Is considering Depo Provera as she has been on this in the past.    The following portions of the patient's history were reviewed and updated as appropriate: allergies, current medications, past family history, past medical history, past social history, past surgical history and problem list.  Review of Systems Pertinent items noted in HPI and remainder of comprehensive ROS otherwise negative.   Objective:   Blood pressure (!) 97/59, pulse 97, resp. rate 16, weight 115 lb 14.4 oz (52.6 kg), unknown if currently breastfeeding. General appearance: alert and no distress Remainder of exam deferred.    Assessment:   History of chlamydia infection Contraception counseling  Plan:   - GC/CL collected through urine. Continue to encourage safe sex practices.  - Reviewed all forms of birth control options available including abstinence; over the counter/barrier methods; hormonal contraceptive medication including pill, patch, ring, injection,contraceptive implant; hormonal and nonhormonal IUDs; permanent sterilization options were not reviewed.  Risks and benefits reviewed.  Patient now considering Nexplanon. Questions were answered.  Information was given to patient to review. To schedule for insertion in 1-2 weeks.    Hildred Laser, MD [facility]

## 2017-02-08 ENCOUNTER — Encounter: Payer: Self-pay | Admitting: Obstetrics and Gynecology

## 2017-02-09 LAB — GC/CHLAMYDIA PROBE AMP
Chlamydia trachomatis, NAA: NEGATIVE
Neisseria gonorrhoeae by PCR: NEGATIVE

## 2017-02-21 ENCOUNTER — Encounter: Payer: Self-pay | Admitting: Obstetrics and Gynecology

## 2017-02-21 ENCOUNTER — Ambulatory Visit (INDEPENDENT_AMBULATORY_CARE_PROVIDER_SITE_OTHER): Payer: 59 | Admitting: Obstetrics and Gynecology

## 2017-02-21 VITALS — BP 111/68 | HR 98 | Ht 68.0 in | Wt 116.7 lb

## 2017-02-21 DIAGNOSIS — Z30017 Encounter for initial prescription of implantable subdermal contraceptive: Secondary | ICD-10-CM

## 2017-02-21 NOTE — Progress Notes (Signed)
     GYNECOLOGY OFFICE PROCEDURE NOTE  Monique Underwood is a 21 y.o. G1P0010 here for Nexplanon insertion.  Patient has never had a pap smear.  No other gynecologic concerns.  Nexplanon Insertion Procedure Patient identified, informed consent performed, consent signed.   Patient does understand that irregular bleeding is a very common side effect of this medication. She was advised to have backup contraception for one week after placement. Pregnancy test in clinic today was negative.  Appropriate time out taken.  Patient's left arm was prepped and draped in the usual sterile fashion. The ruler used to measure and mark insertion area.  Patient was prepped with alcohol swab and then injected with 3 ml of 1% lidocaine.  She was prepped with betadine, Nexplanon removed from packaging,  Device confirmed in needle, then inserted full length of needle and withdrawn per handbook instructions. Nexplanon was able to palpated in the patient's arm; patient palpated the insert herself. There was minimal blood loss.  Patient insertion site covered with guaze and a pressure bandage to reduce any bruising.  The patient tolerated the procedure well and was given post procedure instructions.  She will return in 2 months for follow up and for annual exam.    Hildred Laser, MD [facility]

## 2017-04-09 ENCOUNTER — Other Ambulatory Visit: Payer: Self-pay | Admitting: Obstetrics and Gynecology

## 2017-04-09 DIAGNOSIS — N921 Excessive and frequent menstruation with irregular cycle: Secondary | ICD-10-CM

## 2017-04-10 ENCOUNTER — Ambulatory Visit: Payer: Self-pay | Admitting: Family Medicine

## 2017-04-18 ENCOUNTER — Encounter: Payer: Self-pay | Admitting: Family Medicine

## 2017-04-18 ENCOUNTER — Ambulatory Visit (INDEPENDENT_AMBULATORY_CARE_PROVIDER_SITE_OTHER): Payer: 59 | Admitting: Family Medicine

## 2017-04-18 DIAGNOSIS — J069 Acute upper respiratory infection, unspecified: Secondary | ICD-10-CM

## 2017-04-18 DIAGNOSIS — F411 Generalized anxiety disorder: Secondary | ICD-10-CM

## 2017-04-18 DIAGNOSIS — R110 Nausea: Secondary | ICD-10-CM

## 2017-04-18 MED ORDER — ONDANSETRON 4 MG PO TBDP: 4.0000 mg | ORAL_TABLET | Freq: Three times a day (TID) | ORAL | 2 refills | Status: DC | PRN

## 2017-04-18 NOTE — Progress Notes (Signed)
She has used BZD prev with some relief of anxiety.  D/w pt about options.  She refuse meds for anxiety if they are concurrently approved for depression and are therefore "antidepressants".  D/w pt about her anxiety level.  Her current work situation may contribute some, along with financial strains in general.  She is having anxiety sx every few days with more pronounced panic sx every few months.    She has been working her same job but will start at a new job soon- she may be able to work from home after 90 days and that would be a good change.  Her home situation is okay, she is still sharing a car and that is stressful.    She had seen cards earlier this year.  She still has some nausea and some occ inc in her heart rate.  She has some left over zofran that she had used with some relief.  She has more nausea in the AM- this is longstanding and predates the nexplanon.  She is trying to get enough fluids in.  She isn't pregnant with nexplanon in place.    Recently with nasal congestion, had a fever of 101 yesterday.  Used tylenol.  She feels some better today but still has a few sweats.  No fever today.    Meds, vitals, and allergies reviewed.   ROS: Per HPI unless specifically indicated in ROS section   GEN: nad, alert and oriented, tearful when discussing her current situation but then regains composure. HEENT: mucous membranes moist, tm w/o erythema, nasal exam w/o erythema, clear discharge noted,  OP with cobblestoning NECK: supple w/o LA CV: rrr.   PULM: ctab, no inc wob EXT: no edema

## 2017-04-18 NOTE — Patient Instructions (Signed)
Use zofran in the meantime.  Let me know if the med is too expensive.  I'll check on options about non-benzodiazepine antianxiety meds in the meantime.  If you are interested in starting then let me know.  Take care.  Glad to see you.  Good luck with the new job.

## 2017-04-22 ENCOUNTER — Other Ambulatory Visit: Payer: Self-pay | Admitting: Family Medicine

## 2017-04-22 DIAGNOSIS — J069 Acute upper respiratory infection, unspecified: Secondary | ICD-10-CM | POA: Insufficient documentation

## 2017-04-22 DIAGNOSIS — R110 Nausea: Secondary | ICD-10-CM | POA: Insufficient documentation

## 2017-04-22 DIAGNOSIS — F419 Anxiety disorder, unspecified: Secondary | ICD-10-CM

## 2017-04-22 HISTORY — DX: Nausea: R11.0

## 2017-04-22 HISTORY — DX: Acute upper respiratory infection, unspecified: J06.9

## 2017-04-22 NOTE — Assessment & Plan Note (Addendum)
Discussed with patient at length. She is tearful. Having anxiety symptoms. I will not write for benzodiazepines for her without other ongoing treatment. There is every expectation that she would develop tolerance and treatment failure with benzodiazepines. She has arbitrarily declined several classes of medications for anxiety which could be helpful, including SNRIs, etc. Discussed with patient. See following phone note.   >25 minutes spent in face to face time with patient, >50% spent in counselling or coordination of care.

## 2017-04-22 NOTE — Assessment & Plan Note (Signed)
Likely viral. Nontoxic. Okay for outpatient follow-up.

## 2017-04-22 NOTE — Telephone Encounter (Signed)
Call pt re: anxiety.  I will not write for benzodiazepines for her- we discussed.  She has declined mult other med options.   She can try buspar in the meantime.  If that doesn't help or if she doesn't want to try it, then we can refer to psych.   Let me know if I need to put in the referral.  O/w okay to send med as listed.  Thanks.

## 2017-04-22 NOTE — Assessment & Plan Note (Signed)
She has more nausea in the AM- this is longstanding and predates the nexplanon.  She is trying to get enough fluids in.  She isn't pregnant with nexplanon in place.  Use zofran in the meantime.  This may be related to her dysautonomia symptoms in general. Discussed with patient. Routine cautions given. If worsening in spite of Zofran that she can update me. She agrees. No obvious new focal source to be causing her issues.

## 2017-04-24 NOTE — Telephone Encounter (Signed)
Patient refuses Buspar and would like a referral to psych.

## 2017-04-25 NOTE — Telephone Encounter (Signed)
Referred.  Thanks.  buspar order cancelled.

## 2017-04-27 ENCOUNTER — Encounter: Payer: Self-pay | Admitting: Obstetrics and Gynecology

## 2017-04-27 ENCOUNTER — Ambulatory Visit (INDEPENDENT_AMBULATORY_CARE_PROVIDER_SITE_OTHER): Payer: 59 | Admitting: Obstetrics and Gynecology

## 2017-04-27 VITALS — BP 93/60 | HR 93 | Ht 68.0 in | Wt 118.1 lb

## 2017-04-27 DIAGNOSIS — Z01419 Encounter for gynecological examination (general) (routine) without abnormal findings: Secondary | ICD-10-CM

## 2017-04-27 DIAGNOSIS — Z124 Encounter for screening for malignant neoplasm of cervix: Secondary | ICD-10-CM | POA: Diagnosis not present

## 2017-04-27 DIAGNOSIS — Z975 Presence of (intrauterine) contraceptive device: Secondary | ICD-10-CM | POA: Insufficient documentation

## 2017-04-27 HISTORY — DX: Presence of (intrauterine) contraceptive device: Z97.5

## 2017-04-27 NOTE — Progress Notes (Signed)
GYNECOLOGY ANNUAL PHYSICAL EXAM PROGRESS NOTE  Subjective:    Monique Underwood is a 21 y.o. G29P0010 female who presents for an annual exam. The patient is sexually active.  The patient wears seatbelts: yes. The patient participates in regular exercise: no. Has the patient ever been transfused or tattooed?: yes (professionally done). The patient reports that there is not domestic violence in her life.   The patient has the following complaints today:  1. Notes menstrual cycle lasted approximately 3-4 weeks recently.  Bleeding has just about ended on today.  This is first episode since Nexplanon insertion 2 months ago.   Gynecologic History  Menarche age: 33 Patient's last menstrual period was 04/07/2017. Contraception: Nexplanon (inserted 02/21/2017)  History of STI's: H/o chlamydia 12/2016, treated Last ZOX:WRUEAVW has never had one.    Obstetric History   G1   P0   T0   P0   A1   L0    SAB1   TAB0   Ectopic0   Multiple0   Live Births0     # Outcome Date GA Lbr Len/2nd Weight Sex Delivery Anes PTL Lv  1 SAB  [redacted]w[redacted]d             Past Medical History:  Diagnosis Date  . Anxiety   . EDS (Ehlers-Danlos syndrome)    EDS 3  . History of chlamydia infection 01/04/2017  . Migraine with aura   . Ovarian cyst   . POTS (postural orthostatic tachycardia syndrome)   . Rotavirus infection 1/25 - 07/22/02   Hospitalized  . Syncope     Past Surgical History:  Procedure Laterality Date  . DILATION AND EVACUATION N/A 04/18/2016   Procedure: DILATATION AND EVACUATION;  Surgeon: Monique Laser, MD;  Location: [facility] ORS;  Service: Gynecology;  Laterality: N/A;  . 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 Febrile seizure  7/99   [facility] - w/u negative  . MRI - head     NML    Family History  Problem Relation Age of Onset  . Thyroid disease Mother   . Cancer Other   . Diabetes Paternal Grandmother   . Breast cancer Neg Hx   . Ovarian cancer Neg Hx   . Colon cancer Neg Hx     Social History    Social History  . Marital status: Single    Spouse name: N/A  . Number of children: N/A  . Years of education: N/A   Occupational History  . Not on file.   Social History Main Topics  . Smoking status: Current Every Day Smoker    Packs/day: 0.50    Years: 3.00    Types: Cigarettes  . Smokeless tobacco: Never Used     Comment: Parents doesn't know she smokes!!!!!  . Alcohol use No  . Drug use: No  . Sexual activity: Yes    Birth control/ protection: Implant     Comment: Nexplanon inserted 02/21/17   Other Topics Concern  . Not on file   Social History Narrative   Lives with boyfriend   Working at [employer] in [place]    Current Outpatient Prescriptions on File Prior to Visit  Medication Sig Dispense Refill  . etonogestrel (NEXPLANON) 68 MG IMPL implant 1 each by Subdermal route once.    . metoprolol tartrate (LOPRESSOR) 25 MG tablet Take 0.5-1 tablets (12.5-25 mg total) by mouth 2 (two) times daily as needed (for anxiety/elevated heart rate). 60 tablet 1  . ondansetron (  ZOFRAN ODT) 4 MG disintegrating tablet Take 1 tablet (4 mg total) by mouth every 8 (eight) hours as needed for nausea or vomiting. 20 tablet 2   No current facility-administered medications on file prior to visit.     Allergies  Allergen Reactions  . Shellfish Allergy Anaphylaxis and Hives  . Lexapro [Escitalopram Oxalate] Other (See Comments)    GI upset.      Review of Systems Constitutional: negative for chills, fatigue, fevers and sweats Eyes: negative for irritation, redness and visual disturbance Ears, nose, mouth, throat, and face: negative for hearing loss, nasal congestion, snoring and tinnitus Respiratory: negative for asthma, cough, sputum Cardiovascular: negative for chest pain, dyspnea, exertional chest pressure/discomfort, irregular heart beat, palpitations and syncope Gastrointestinal: negative for abdominal pain, change in bowel habits, nausea and vomiting Genitourinary:  negative for abnormal menstrual periods, genital lesions, sexual problems and vaginal discharge, dysuria and urinary incontinence Integument/breast: negative for breast lump, breast tenderness and nipple discharge Hematologic/lymphatic: negative for bleeding and easy bruising Musculoskeletal:negative for back pain and muscle weakness Neurological: negative for dizziness, headaches, vertigo and weakness Endocrine: negative for diabetic symptoms including polydipsia, polyuria and skin dryness Allergic/Immunologic: negative for hay fever and urticaria        Objective:  Blood pressure 93/60, pulse 93, height 5\' 8"  (1.727 m), weight 118 lb 1.6 oz (53.6 kg), last menstrual period 04/07/2017. Body mass index is 17.96 kg/m.  General Appearance:    Alert, cooperative, no distress, appears stated age  Head:    Normocephalic, without obvious abnormality, atraumatic  Eyes:    PERRL, conjunctiva/corneas clear, EOM's intact, both eyes  Ears:    Normal external ear canals, both ears  Nose:   Nares normal, septum midline, mucosa normal, no drainage or sinus tenderness  Throat:   Lips, mucosa, and tongue normal; teeth and gums normal  Neck:   Supple, symmetrical, trachea midline, no adenopathy; thyroid: no enlargement/tenderness/nodules; no carotid bruit or JVD  Back:     Symmetric, no curvature, ROM normal, no CVA tenderness  Lungs:     Clear to auscultation bilaterally, respirations unlabored  Chest Wall:    No tenderness or deformity   Heart:    Regular rate and rhythm, S1 and S2 normal, no murmur, rub or gallop  Breast Exam:    No tenderness, masses, or nipple abnormality  Abdomen:     Soft, non-tender, bowel sounds active all four quadrants, no masses, no organomegaly.    Genitalia:    Pelvic:external genitalia normal, vagina without lesions, discharge, or tenderness, rectovaginal septum  normal. Cervix normal in appearance, no cervical motion tenderness, no adnexal masses or tenderness.  Uterus  normal size, shape, mobile, regular contours, nontender.  Rectal:    Normal external sphincter.  No hemorrhoids appreciated. Internal exam not done.   Extremities:   Extremities normal, atraumatic, no cyanosis or edema  Pulses:   2+ and symmetric all extremities  Skin:   Skin color, texture, turgor normal, no rashes or lesions  Lymph nodes:   Cervical, supraclavicular, and axillary nodes normal  Neurologic:   CNII-XII intact, normal strength, sensation and reflexes throughout   .  Labs:  Lab Results  Component Value Date   WBC 9.2 12/20/2016   HGB 13.9 12/20/2016   HCT 40.0 12/20/2016   MCV 90.0 12/20/2016   PLT 217 12/20/2016    Lab Results  Component Value Date   CREATININE 0.79 12/20/2016   BUN 8 12/20/2016   NA 134 (L) 12/20/2016   K  3.4 (L) 12/20/2016   CL 107 12/20/2016   CO2 23 12/20/2016    Lab Results  Component Value Date   ALT 21 04/18/2016   AST 24 04/18/2016   ALKPHOS 53 04/18/2016   BILITOT 0.4 04/18/2016    Lab Results  Component Value Date   TSH 0.82 12/16/2014     Assessment:    Healthy female exam.   Cervical cancer screening  Plan:     Blood tests: up to date. Breast self exam technique reviewed and patient encouraged to perform self-exam monthly. Contraception: Nexplanon. Advised on occurrence of irregular menses during first few months after Nexplanon insertion.  Discussed healthy lifestyle modifications. Pap smear performed today. Gonorrhea/chlamydia screen performed with pap smear.   Declines flu vaccine Discussed HPV vaccine, handouts given.     Monique Underwood, Monique Cleaver, MD [facility]

## 2017-04-27 NOTE — Patient Instructions (Addendum)
HPV (Human Papillomavirus) Vaccine: What You Need to Know 1. Why get vaccinated? HPV vaccine prevents infection with human papillomavirus (HPV) types that are associated with many cancers, including:  cervical cancer in females,  vaginal and vulvar cancers in females,  anal cancer in females and males,  throat cancer in females and males, and  penile cancer in males.  In addition, HPV vaccine prevents infection with HPV types that cause genital warts in both females and males. In the [place] about 12,000 women get cervical cancer every year, and about 4,000 women die from it. HPV vaccine can prevent most of these cases of cervical cancer. Vaccination is not a substitute for cervical cancer screening. This vaccine does not protect against all HPV types that can cause cervical cancer. Women should still get regular Pap tests. HPV infection usually comes from sexual contact, and most people will become infected at some point in their life. About 14 million Americans, including teens, get infected every year. Most infections will go away on their own and not cause serious problems. But thousands of women and men get cancer and other diseases from HPV. 2. HPV vaccine HPV vaccine is approved by FDA and is recommended by CDC for both males and females. It is routinely given at 80 or 21 years of age, but it may be given beginning at age 17 years through age 9 years. Most adolescents 9 through 21 years of age should get HPV vaccine as a two-dose series with the doses separated by 6-12 months. People who start HPV vaccination at 10 years of age and older should get the vaccine as a three-dose series with the second dose given 1-2 months after the first dose and the third dose given 6 months after the first dose. There are several exceptions to these age recommendations. Your health care provider can give you more information. 3. Some people should not get this vaccine  Anyone who has had a severe  (life-threatening) allergic reaction to a dose of HPV vaccine should not get another dose.  Anyone who has a severe (life threatening) allergy to any component of HPV vaccine should not get the vaccine.  Tell your doctor if you have any severe allergies that you know of, including a severe allergy to yeast.  HPV vaccine is not recommended for pregnant women. If you learn that you were pregnant when you were vaccinated, there is no reason to expect any problems for you or your baby. Any woman who learns she was pregnant when she got HPV vaccine is encouraged to contact the manufacturer's registry for HPV vaccination during pregnancy at (424)122-5857. Women who are breastfeeding may be vaccinated.  If you have a mild illness, such as a cold, you can probably get the vaccine today. If you are moderately or severely ill, you should probably wait until you recover. Your doctor can advise you. 4. Risks of a vaccine reaction With any medicine, including vaccines, there is a chance of side effects. These are usually mild and go away on their own, but serious reactions are also possible. Most people who get HPV vaccine do not have any serious problems with it. Mild or moderate problems following HPV vaccine:  Reactions in the arm where the shot was given: ? Soreness (about 9 people in 10) ? Redness or swelling (about 1 person in 3)  Fever: ? Mild (100F) (about 1 person in 10) ? Moderate (102F) (about 1 person in 26)  Other problems: ? Headache (about 1 person  in 3) Problems that could happen after any injected vaccine:  People sometimes faint after a medical procedure, including vaccination. Sitting or lying down for about 15 minutes can help prevent fainting, and injuries caused by a fall. Tell your doctor if you feel dizzy, or have vision changes or ringing in the ears.  Some people get severe pain in the shoulder and have difficulty moving the arm where a shot was given. This happens very  rarely.  Any medication can cause a severe allergic reaction. Such reactions from a vaccine are very rare, estimated at about 1 in a million doses, and would happen within a few minutes to a few hours after the vaccination. As with any medicine, there is a very remote chance of a vaccine causing a serious injury or death. The safety of vaccines is always being monitored. For more information, visit: http://www.aguilar.org/. 5. What if there is a serious reaction? What should I look for? Look for anything that concerns you, such as signs of a severe allergic reaction, very high fever, or unusual behavior. Signs of a severe allergic reaction can include hives, swelling of the face and throat, difficulty breathing, a fast heartbeat, dizziness, and weakness. These would usually start a few minutes to a few hours after the vaccination. What should I do? If you think it is a severe allergic reaction or other emergency that can't wait, call 9-1-1 or get to the nearest hospital. Otherwise, call your doctor. Afterward, the reaction should be reported to the Vaccine Adverse Event Reporting System (VAERS). Your doctor should file this report, or you can do it yourself through the VAERS web site at www.vaers.SamedayNews.es, or by calling 931-818-3457. VAERS does not give medical advice. 6. The National Vaccine Injury Compensation Program The [employer] Vaccine Injury Compensation Program (VICP) is a federal program that was created to compensate people who may have been injured by certain vaccines. Persons who believe they may have been injured by a vaccine can learn about the program and about filing a claim by calling 217-045-4076 or visiting the [employer] website at GoldCloset.com.ee. There is a time limit to file a claim for compensation. 7. How can I learn more?  Ask your health care provider. He or she can give you the vaccine package insert or suggest other sources of information.  Call your  local or state health department.  Contact the Centers for Disease Control and Prevention (CDC): ? Call 207-735-4816 (1-800-CDC-INFO) or ? Visit CDC's website at http://sweeney-todd.com/ Vaccine Information Statement, HPV Vaccine (05/28/2015) This information is not intended to replace advice given to you by your health care provider. Make sure you discuss any questions you have with your health care provider. Document Released: 01/07/2014 Document Revised: 03/02/2016 Document Reviewed: 03/02/2016 Elsevier Interactive Patient Education  2017 [place] Maintenance, Female Adopting a healthy lifestyle and getting preventive care can go a long way to promote health and wellness. Talk with your health care provider about what schedule of regular examinations is right for you. This is a good chance for you to check in with your provider about disease prevention and staying healthy. In between checkups, there are plenty of things you can do on your own. Experts have done a lot of research about which lifestyle changes and preventive measures are most likely to keep you healthy. Ask your health care provider for more information. Weight and diet Eat a healthy diet  Be sure to include plenty of vegetables, fruits, low-fat dairy products, and lean protein.  Do not eat a lot of foods high in solid fats, added sugars, or salt.  Get regular exercise. This is one of the most important things you can do for your health. ? Most adults should exercise for at least 150 minutes each week. The exercise should increase your heart rate and make you sweat (moderate-intensity exercise). ? Most adults should also do strengthening exercises at least twice a week. This is in addition to the moderate-intensity exercise.  Maintain a healthy weight  Body mass index (BMI) is a measurement that can be used to identify possible weight problems. It estimates body fat based on height and weight. Your health care  provider can help determine your BMI and help you achieve or maintain a healthy weight.  For females 55 years of age and older: ? A BMI below 18.5 is considered underweight. ? A BMI of 18.5 to 24.9 is normal. ? A BMI of 25 to 29.9 is considered overweight. ? A BMI of 30 and above is considered obese.  Watch levels of cholesterol and blood lipids  You should start having your blood tested for lipids and cholesterol at 21 years of age, then have this test every 5 years.  You may need to have your cholesterol levels checked more often if: ? Your lipid or cholesterol levels are high. ? You are older than 21 years of age. ? You are at high risk for heart disease.  Cancer screening Lung Cancer  Lung cancer screening is recommended for adults 51-14 years old who are at high risk for lung cancer because of a history of smoking.  A yearly low-dose CT scan of the lungs is recommended for people who: ? Currently smoke. ? Have quit within the past 15 years. ? Have at least a 30-pack-year history of smoking. A pack year is smoking an average of one pack of cigarettes a day for 1 year.  Yearly screening should continue until it has been 15 years since you quit.  Yearly screening should stop if you develop a health problem that would prevent you from having lung cancer treatment.  Breast Cancer  Practice breast self-awareness. This means understanding how your breasts normally appear and feel.  It also means doing regular breast self-exams. Let your health care provider know about any changes, no matter how small.  If you are in your 20s or 30s, you should have a clinical breast exam (CBE) by a health care provider every 1-3 years as part of a regular health exam.  If you are 24 or older, have a CBE every year. Also consider having a breast X-ray (mammogram) every year.  If you have a family history of breast cancer, talk to your health care provider about genetic screening.  If you are  at high risk for breast cancer, talk to your health care provider about having an MRI and a mammogram every year.  Breast cancer gene (BRCA) assessment is recommended for women who have family members with BRCA-related cancers. BRCA-related cancers include: ? Breast. ? Ovarian. ? Tubal. ? Peritoneal cancers.  Results of the assessment will determine the need for genetic counseling and BRCA1 and BRCA2 testing.  Cervical Cancer Your health care provider may recommend that you be screened regularly for cancer of the pelvic organs (ovaries, uterus, and vagina). This screening involves a pelvic examination, including checking for microscopic changes to the surface of your cervix (Pap test). You may be encouraged to have this screening done every 3 years, beginning at  age 31.  For women ages 60-65, health care providers may recommend pelvic exams and Pap testing every 3 years, or they may recommend the Pap and pelvic exam, combined with testing for human papilloma virus (HPV), every 5 years. Some types of HPV increase your risk of cervical cancer. Testing for HPV may also be done on women of any age with unclear Pap test results.  Other health care providers may not recommend any screening for nonpregnant women who are considered low risk for pelvic cancer and who do not have symptoms. Ask your health care provider if a screening pelvic exam is right for you.  If you have had past treatment for cervical cancer or a condition that could lead to cancer, you need Pap tests and screening for cancer for at least 20 years after your treatment. If Pap tests have been discontinued, your risk factors (such as having a new sexual partner) need to be reassessed to determine if screening should resume. Some women have medical problems that increase the chance of getting cervical cancer. In these cases, your health care provider may recommend more frequent screening and Pap tests.  Colorectal Cancer  This type of  cancer can be detected and often prevented.  Routine colorectal cancer screening usually begins at 21 years of age and continues through 21 years of age.  Your health care provider may recommend screening at an earlier age if you have risk factors for colon cancer.  Your health care provider may also recommend using home test kits to check for hidden blood in the stool.  A small camera at the end of a tube can be used to examine your colon directly (sigmoidoscopy or colonoscopy). This is done to check for the earliest forms of colorectal cancer.  Routine screening usually begins at age 57.  Direct examination of the colon should be repeated every 5-10 years through 21 years of age. However, you may need to be screened more often if early forms of precancerous polyps or small growths are found.  Skin Cancer  Check your skin from head to toe regularly.  Tell your health care provider about any new moles or changes in moles, especially if there is a change in a mole's shape or color.  Also tell your health care provider if you have a mole that is larger than the size of a pencil eraser.  Always use sunscreen. Apply sunscreen liberally and repeatedly throughout the day.  Protect yourself by wearing long sleeves, pants, a wide-brimmed hat, and sunglasses whenever you are outside.  Heart disease, diabetes, and high blood pressure  High blood pressure causes heart disease and increases the risk of stroke. High blood pressure is more likely to develop in: ? People who have blood pressure in the high end of the normal range (130-139/85-89 mm Hg). ? People who are overweight or obese. ? People who are African American.  If you are 75-63 years of age, have your blood pressure checked every 3-5 years. If you are 36 years of age or older, have your blood pressure checked every year. You should have your blood pressure measured twice-once when you are at a hospital or clinic, and once when you are  not at a hospital or clinic. Record the average of the two measurements. To check your blood pressure when you are not at a hospital or clinic, you can use: ? An automated blood pressure machine at a pharmacy. ? A home blood pressure monitor.  If you are  between 61 years and 52 years old, ask your health care provider if you should take aspirin to prevent strokes.  Have regular diabetes screenings. This involves taking a blood sample to check your fasting blood sugar level. ? If you are at a normal weight and have a low risk for diabetes, have this test once every three years after 21 years of age. ? If you are overweight and have a high risk for diabetes, consider being tested at a younger age or more often. Preventing infection Hepatitis B  If you have a higher risk for hepatitis B, you should be screened for this virus. You are considered at high risk for hepatitis B if: ? You were born in a country where hepatitis B is common. Ask your health care provider which countries are considered high risk. ? Your parents were born in a high-risk country, and you have not been immunized against hepatitis B (hepatitis B vaccine). ? You have HIV or AIDS. ? You use needles to inject street drugs. ? You live with someone who has hepatitis B. ? You have had sex with someone who has hepatitis B. ? You get hemodialysis treatment. ? You take certain medicines for conditions, including cancer, organ transplantation, and autoimmune conditions.  Hepatitis C  Blood testing is recommended for: ? Everyone born from [birth year] through 1965. ? Anyone with known risk factors for hepatitis C.  Sexually transmitted infections (STIs)  You should be screened for sexually transmitted infections (STIs) including gonorrhea and chlamydia if: ? You are sexually active and are younger than 21 years of age. ? You are older than 21 years of age and your health care provider tells you that you are at risk for this type of  infection. ? Your sexual activity has changed since you were last screened and you are at an increased risk for chlamydia or gonorrhea. Ask your health care provider if you are at risk.  If you do not have HIV, but are at risk, it may be recommended that you take a prescription medicine daily to prevent HIV infection. This is called pre-exposure prophylaxis (PrEP). You are considered at risk if: ? You are sexually active and do not regularly use condoms or know the HIV status of your partner(s). ? You take drugs by injection. ? You are sexually active with a partner who has HIV.  Talk with your health care provider about whether you are at high risk of being infected with HIV. If you choose to begin PrEP, you should first be tested for HIV. You should then be tested every 3 months for as long as you are taking PrEP. Pregnancy  If you are premenopausal and you may become pregnant, ask your health care provider about preconception counseling.  If you may become pregnant, take 400 to 800 micrograms (mcg) of folic acid every day.  If you want to prevent pregnancy, talk to your health care provider about birth control (contraception). Osteoporosis and menopause  Osteoporosis is a disease in which the bones lose minerals and strength with aging. This can result in serious bone fractures. Your risk for osteoporosis can be identified using a bone density scan.  If you are 82 years of age or older, or if you are at risk for osteoporosis and fractures, ask your health care provider if you should be screened.  Ask your health care provider whether you should take a calcium or vitamin D supplement to lower your risk for osteoporosis.  Menopause may have  certain physical symptoms and risks.  Hormone replacement therapy may reduce some of these symptoms and risks. Talk to your health care provider about whether hormone replacement therapy is right for you. Follow these instructions at home:  Schedule  regular health, dental, and eye exams.  Stay current with your immunizations.  Do not use any tobacco products including cigarettes, chewing tobacco, or electronic cigarettes.  If you are pregnant, do not drink alcohol.  If you are breastfeeding, limit how much and how often you drink alcohol.  Limit alcohol intake to no more than 1 drink per day for nonpregnant women. One drink equals 12 ounces of beer, 5 ounces of wine, or 1 ounces of hard liquor.  Do not use street drugs.  Do not share needles.  Ask your health care provider for help if you need support or information about quitting drugs.  Tell your health care provider if you often feel depressed.  Tell your health care provider if you have ever been abused or do not feel safe at home. This information is not intended to replace advice given to you by your health care provider. Make sure you discuss any questions you have with your health care provider. Document Released: 12/26/2010 Document Revised: 11/18/2015 Document Reviewed: 03/16/2015 Elsevier Interactive Patient Education  [employer].

## 2017-05-01 LAB — PAP IG, CT-NG, RFX HPV ASCU
Chlamydia, Nuc. Acid Amp: NEGATIVE
Gonococcus by Nucleic Acid Amp: NEGATIVE
PAP SMEAR COMMENT: 0

## 2017-07-01 IMAGING — US US OB COMP LESS 14 WK
1 series · 14 of 28 positions shown · non-contrast
Comparison: None.

CLINICAL DATA: Pregnant, suprapubic abdominal pain x2 days

EXAM:
OBSTETRIC <14 WK ULTRASOUND
TECHNIQUE: Transabdominal ultrasound was performed for evaluation of the
gestation as well as the maternal uterus and adnexal regions.

[Series 1: us ob comp less 14 wk · 0.16mm/px · 57 acquisitions, 14 frames shown]
[im 3/57]
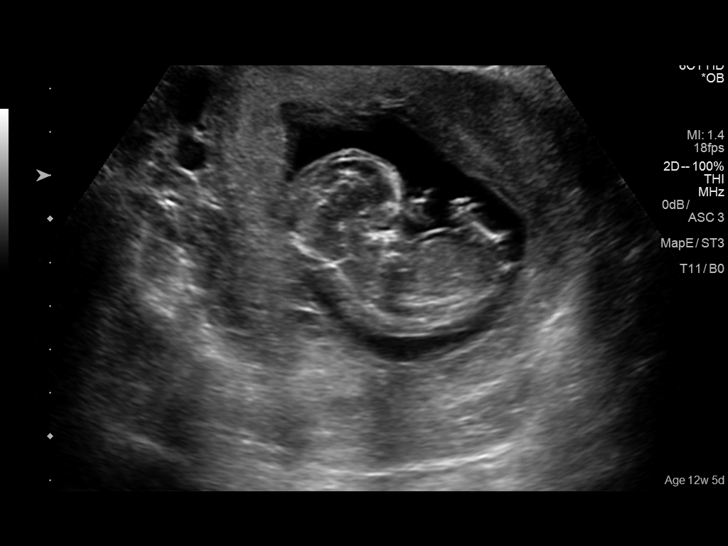
[im 7/57]
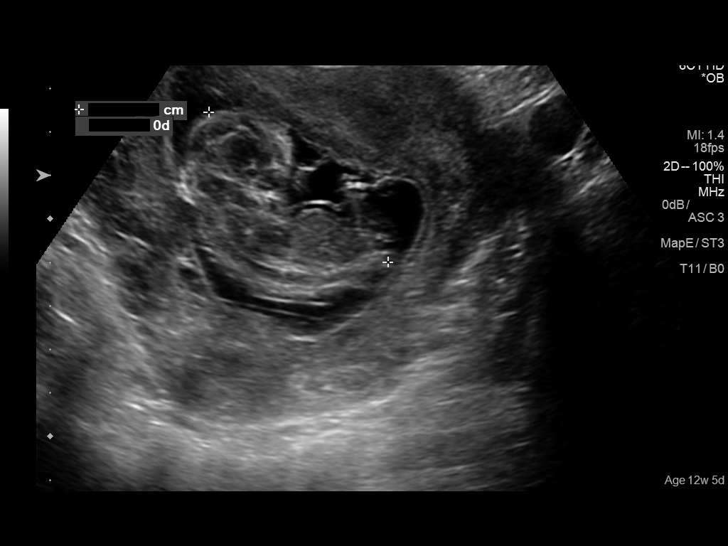
[im 11/57]
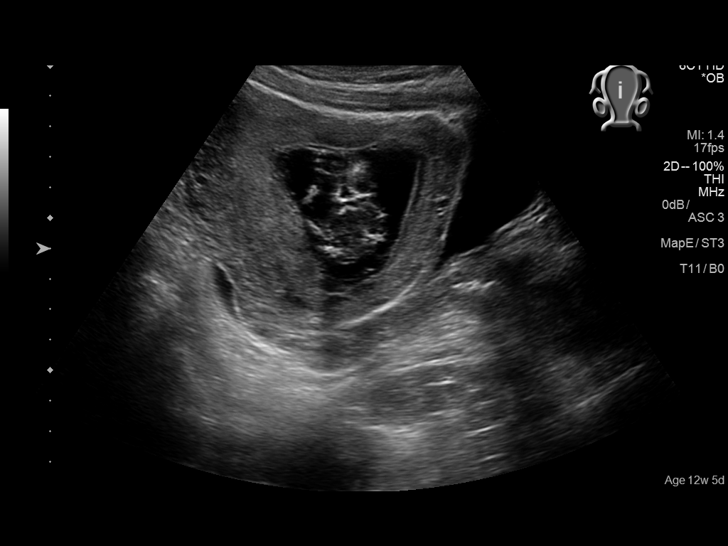
[im 15/57]
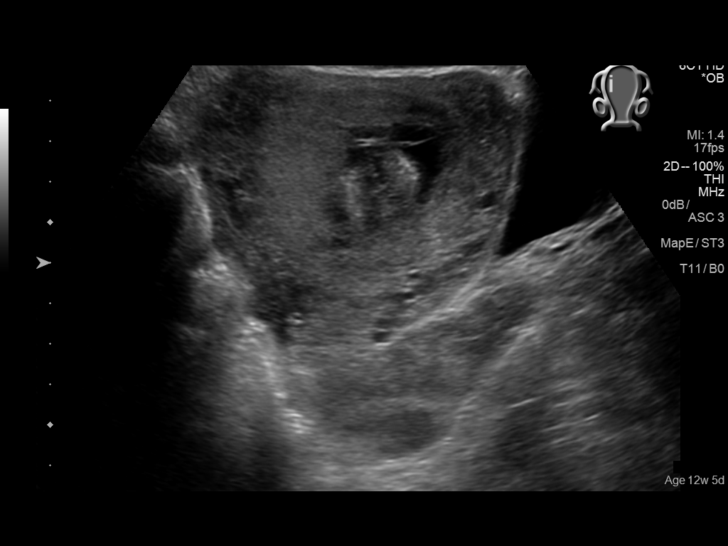
[im 19/57]
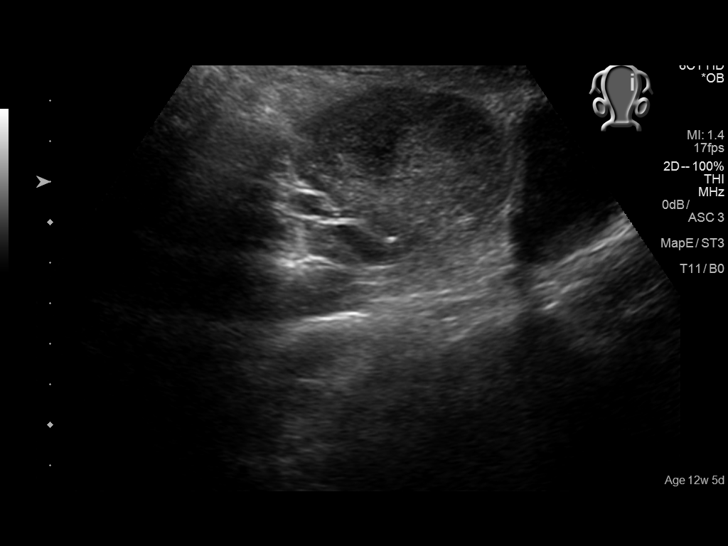
[im 23/57]
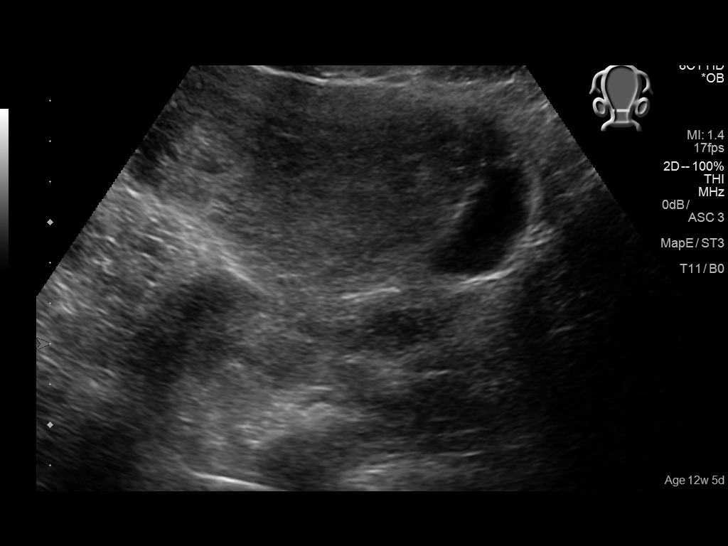
[im 27/57]
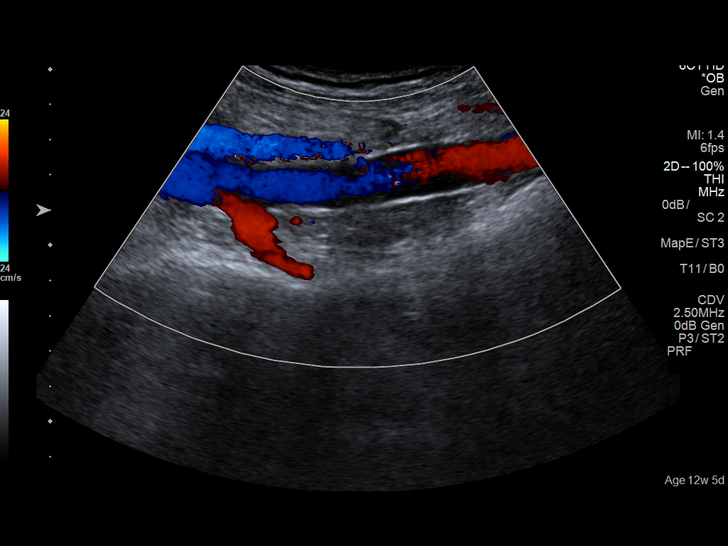
[im 32/57]
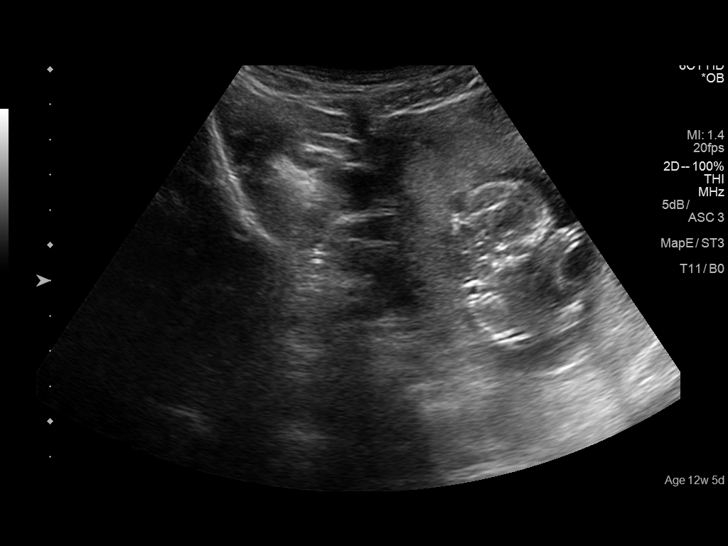
[im 36/57]
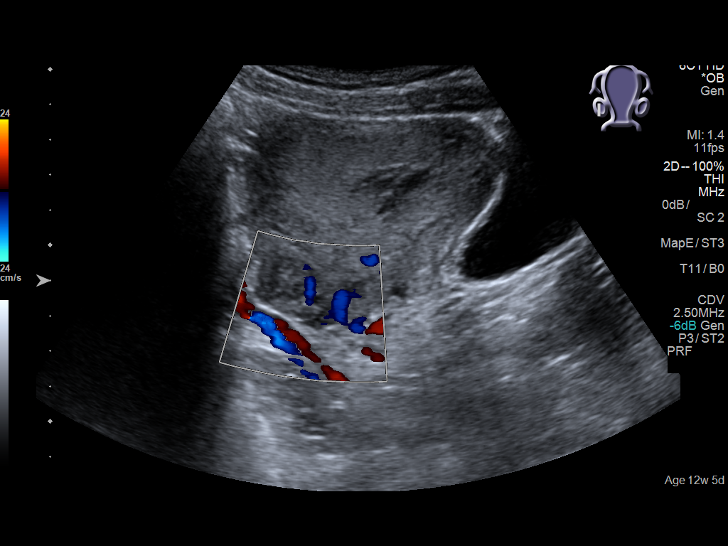
[im 40/57]
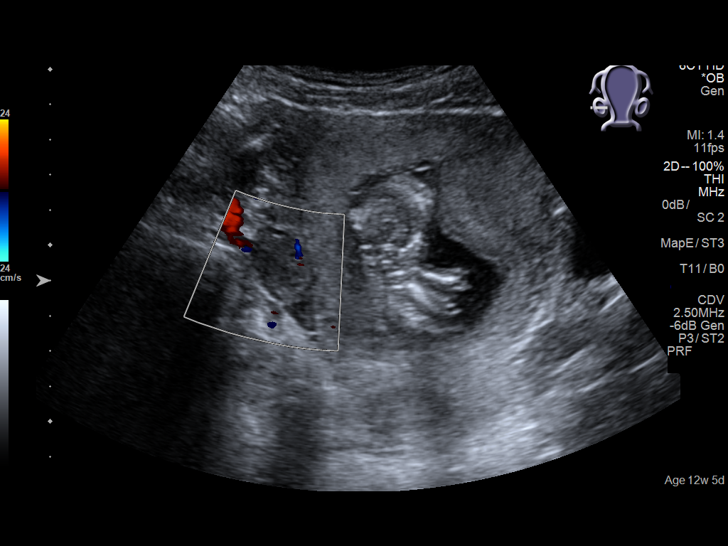
[im 44/57]
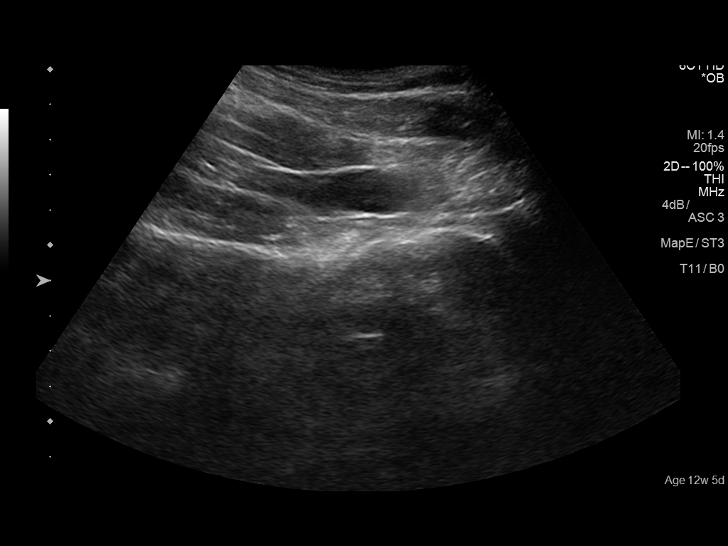
[im 48/57]
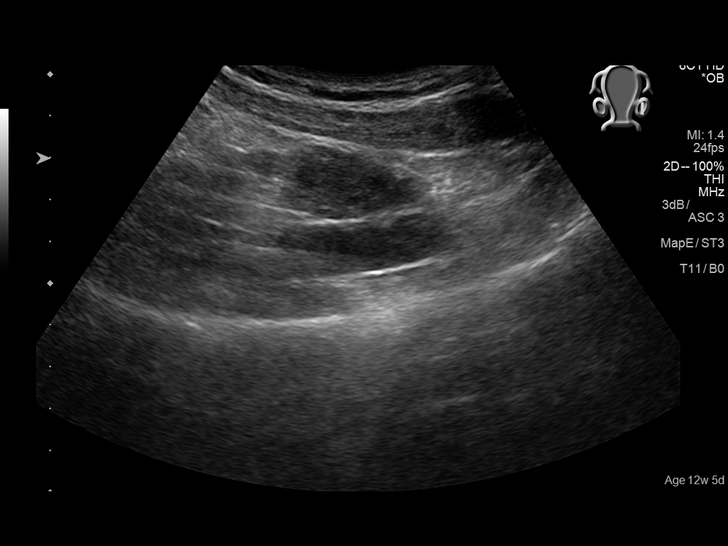
[im 52/57]
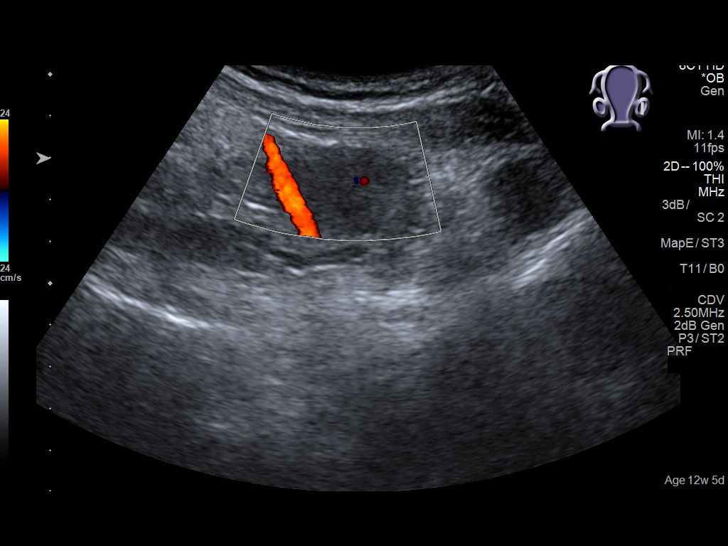
[im 57/57]
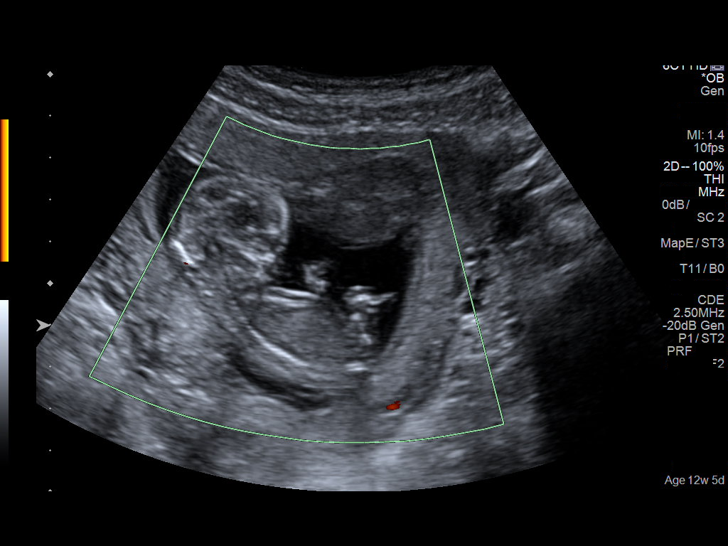

[14 of 28 positions shown; findings below may reference images not displayed]

FINDINGS: Intrauterine gestational sac: Single

Yolk sac:  Not visualized

Embryo:  Present

Cardiac Activity: Not Visualized.

CRL:   53.7  mm   12 w 0 d

Subchorionic hemorrhage:  None visualized.

Maternal uterus/adnexae: Bilateral ovaries are within normal limits.

No free fluid.
IMPRESSION: Single intrauterine gestation without cardiac activity, as described
above.

Findings meet definitive criteria for failed pregnancy. This follows
SRU consensus guidelines: Diagnostic Criteria for Nonviable
Pregnancy Early in the First Trimester. N Engl J Med

## 2017-08-08 ENCOUNTER — Other Ambulatory Visit: Payer: Self-pay | Admitting: Family Medicine

## 2017-08-08 NOTE — Telephone Encounter (Signed)
Sent.  Update [place] if not better with med.  Thanks.

## 2017-08-08 NOTE — Telephone Encounter (Signed)
Last office visit 04/18/2017.  Last refilled 10/24/018 for #20 with 2 refills.  Ok to refill?

## 2017-08-09 NOTE — Telephone Encounter (Signed)
Left detailed message on voicemail.  

## 2017-09-17 ENCOUNTER — Other Ambulatory Visit: Payer: Self-pay | Admitting: Family Medicine

## 2017-09-17 NOTE — Telephone Encounter (Signed)
Electronic refill request. Ondansetron Last office visit:   04/18/17 Last Filled:    20 tablet 0 08/08/2017  Please advise.

## 2017-09-18 NOTE — Telephone Encounter (Signed)
Sent. Thanks.   

## 2017-10-11 ENCOUNTER — Ambulatory Visit (INDEPENDENT_AMBULATORY_CARE_PROVIDER_SITE_OTHER): Payer: No Typology Code available for payment source | Admitting: Internal Medicine

## 2017-10-11 VITALS — BP 104/73 | HR 97 | Ht 68.0 in | Wt 125.5 lb

## 2017-10-11 DIAGNOSIS — G901 Familial dysautonomia [Riley-Day]: Secondary | ICD-10-CM

## 2017-10-11 DIAGNOSIS — R002 Palpitations: Secondary | ICD-10-CM

## 2017-10-11 NOTE — Patient Instructions (Signed)

## 2017-10-11 NOTE — Progress Notes (Signed)
Patient Care Team: Joaquim Nam, MD as PCP - General (Family Medicine)   HPI  Monique Underwood is a 22 y.o. female Seen in followup for exertional palps consistent with sinus rhythm with other findings consistent with autonomic dysfunction incl hypotension, violaceous discoloration of lower extremites syncope  She was also noted to have joint laxity and arachnodactyly  Echo showed normal LV function and structure CT neg  She is had no interval syncope.  She continues with a.m. dizziness continues to shower in the morning.  Menses have been poorly controlled she is now on a new regime with the hopes of menses suppression is.  She remains quite stressed.  She takes metoprolol periodically; it is her impression that anxiety is often a trigger for her tachypalpitations.  She has stopped smoking  Alcohol intake is relatively scant; no marijuana  She has a new job which she likes much better Past Medical History:  Diagnosis Date  . Anxiety   . EDS (Ehlers-Danlos syndrome)    EDS 3  . History of chlamydia infection 01/04/2017  . Migraine with aura   . Ovarian cyst   . POTS (postural orthostatic tachycardia syndrome)   . Rotavirus infection 1/25 - 07/22/02   Hospitalized  . Syncope     Past Surgical History:  Procedure Laterality Date  . DILATION AND EVACUATION N/A 04/18/2016   Procedure: DILATATION AND EVACUATION;  Surgeon: Hildred Laser, MD;  Location: [facility] ORS;  Service: Gynecology;  Laterality: N/A;  . 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 Febrile seizure  7/99   [facility] - w/u negative  . MRI - head     NML    Current Outpatient Medications  Medication Sig Dispense Refill  . etonogestrel (NEXPLANON) 68 MG IMPL implant 1 each by Subdermal route once.    . metoprolol tartrate (LOPRESSOR) 25 MG tablet Take 0.5-1 tablets (12.5-25 mg total) by mouth 2 (two) times daily as needed (for anxiety/elevated heart rate). 60 tablet 1  . ondansetron (ZOFRAN-ODT) 4 MG disintegrating  tablet TAKE 1 TABLET BY MOUTH EVERY 8 HOURS AS NEEDED FOR NAUSEA AND VOMITING 20 tablet 0   No current facility-administered medications for this visit.     Allergies  Allergen Reactions  . Shellfish Allergy Anaphylaxis and Hives  . Lexapro [Escitalopram Oxalate] Other (See Comments)    GI upset.       Review of Systems negative except from HPI and PMH  Physical Exam BP 104/73 (BP Location: Right Arm, Patient Position: Sitting, Cuff Size: Normal)   Pulse 97   Ht 5\' 8"  (1.727 m)   Wt 125 lb 8 oz (56.9 kg)   BMI 19.08 kg/m  Well developed and nourished in no acute distress HENT normal Neck supple with JVP-flat Clear Regular rate and rhythm, no murmurs or gallops Abd-soft with active BS No Clubbing cyanosis edema Skin-warm and dry A & Oriented  Grossly normal sensory and motor function   ECG demonstrates sinus at 97 Interval of 05/02/1934  Assessment and  Plan  1 dysautonomia  2-joint laxity probably Ehlers-Danlos-3  3-anxiety/depresson  Cigarette use   We have again reviewed the physiology.  I encouraged her to take cooler showers in the morning.  She is following up with Dr. Valentino Saxon regarding her menses.  She will continue to use her beta-blockers on an as-needed basis.  Rather than directed at a heart rate combo she will direct it at  Symptoms.  She has stopped  smoking-hooray  We spent more than 50% of our >25 min visit in face to face counseling regarding the above

## 2017-10-18 ENCOUNTER — Other Ambulatory Visit: Payer: Self-pay | Admitting: Family Medicine

## 2017-10-18 NOTE — Telephone Encounter (Signed)
Electronic refill request. Ondansetron Last office visit:   04/18/2017 Last Filled:    20 tablet 0 09/18/2017  Please advise.

## 2017-10-19 NOTE — Telephone Encounter (Signed)
Needs f/u re: nausea.   rx sent.  Thanks.

## 2017-10-19 NOTE — Telephone Encounter (Signed)
Appt scheduled

## 2017-10-23 NOTE — Addendum Note (Signed)
Addended by: Kendrick Fries on: 10/23/2017 07:32 AM   Modules accepted: Orders

## 2017-10-31 ENCOUNTER — Ambulatory Visit (INDEPENDENT_AMBULATORY_CARE_PROVIDER_SITE_OTHER): Payer: No Typology Code available for payment source | Admitting: Family Medicine

## 2017-10-31 ENCOUNTER — Encounter: Payer: Self-pay | Admitting: Family Medicine

## 2017-10-31 VITALS — BP 102/58 | HR 92 | Temp 98.4°F | Ht 68.0 in | Wt 124.2 lb

## 2017-10-31 DIAGNOSIS — Z7189 Other specified counseling: Secondary | ICD-10-CM | POA: Diagnosis not present

## 2017-10-31 DIAGNOSIS — Z113 Encounter for screening for infections with a predominantly sexual mode of transmission: Secondary | ICD-10-CM | POA: Diagnosis not present

## 2017-10-31 DIAGNOSIS — I959 Hypotension, unspecified: Secondary | ICD-10-CM | POA: Diagnosis not present

## 2017-10-31 DIAGNOSIS — R110 Nausea: Secondary | ICD-10-CM | POA: Diagnosis not present

## 2017-10-31 MED ORDER — ONDANSETRON 4 MG PO TBDP: ORAL_TABLET | ORAL | 2 refills | Status: DC

## 2017-10-31 NOTE — Patient Instructions (Signed)
Thanks for your effort.  Go to the lab on the way out.  We'll contact you with your lab report. Take care.  Glad to see you.

## 2017-10-31 NOTE — Progress Notes (Signed)
She is trying to drink more water, sitting up in bed prior to getting out, trying to adjust the water temperature in the shower.    She has AM nausea that gets better with zofran. Somedays are better/worse than others, in spite of trying to intervene as above.  We talked about summertime cautions re: heat/fluids.    She has used metoprolol rarely, during periods of high stress.    She is working from home now and that is going well.  Her transportation situation is some better.  Her weight is up some and she is happy about that.    She has nexplanon.  She is <1 year into treatment.  She has irregular menses with that.    Advance directive- mother designated if patient were incapacitated.    She wanted STD check. She has occ mild discharge but no dysuria. See notes on labs.    She quit smoking and I thanked her for her effort.  Meds, vitals, and allergies reviewed.   ROS: Per HPI unless specifically indicated in ROS section   GEN: nad, alert and oriented HEENT: mucous membranes moist NECK: supple w/o LA CV: rrr PULM: ctab, no inc wob ABD: soft, +bs EXT: no edema

## 2017-11-01 DIAGNOSIS — Z113 Encounter for screening for infections with a predominantly sexual mode of transmission: Secondary | ICD-10-CM

## 2017-11-01 HISTORY — DX: Encounter for screening for infections with a predominantly sexual mode of transmission: Z11.3

## 2017-11-01 LAB — CBC WITH DIFFERENTIAL/PLATELET
Basophils Absolute: 0 10*3/uL (ref 0.0–0.1)
Basophils Relative: 0.3 % (ref 0.0–3.0)
EOS PCT: 1 % (ref 0.0–5.0)
Eosinophils Absolute: 0.1 10*3/uL (ref 0.0–0.7)
HEMATOCRIT: 40.2 % (ref 36.0–46.0)
HEMOGLOBIN: 13.5 g/dL (ref 12.0–15.0)
LYMPHS ABS: 3.8 10*3/uL (ref 0.7–4.0)
Lymphocytes Relative: 34.4 % (ref 12.0–46.0)
MCHC: 33.6 g/dL (ref 30.0–36.0)
MCV: 91.4 fl (ref 78.0–100.0)
MONO ABS: 0.9 10*3/uL (ref 0.1–1.0)
Monocytes Relative: 8 % (ref 3.0–12.0)
NEUTROS ABS: 6.3 10*3/uL (ref 1.4–7.7)
Neutrophils Relative %: 56.3 % (ref 43.0–77.0)
PLATELETS: 262 10*3/uL (ref 150.0–400.0)
RBC: 4.4 Mil/uL (ref 3.87–5.11)
RDW: 12.9 % (ref 11.5–15.5)
WBC: 11.1 10*3/uL — ABNORMAL HIGH (ref 4.0–10.5)

## 2017-11-01 LAB — BASIC METABOLIC PANEL
BUN: 8 mg/dL (ref 6–23)
CALCIUM: 9.5 mg/dL (ref 8.4–10.5)
CO2: 28 meq/L (ref 19–32)
Chloride: 105 mEq/L (ref 96–112)
Creatinine, Ser: 0.89 mg/dL (ref 0.40–1.20)
GFR: 84.45 mL/min (ref >60.00)
GLUCOSE: 86 mg/dL (ref 70–99)
Potassium: 4.2 mEq/L (ref 3.5–5.1)
Sodium: 139 mEq/L (ref 135–145)

## 2017-11-01 LAB — C. TRACHOMATIS/N. GONORRHOEAE RNA
C. trachomatis RNA, TMA: NOT DETECTED
N. GONORRHOEAE RNA, TMA: NOT DETECTED

## 2017-11-01 LAB — RPR: RPR Ser Ql: NONREACTIVE

## 2017-11-01 LAB — HIV ANTIBODY (ROUTINE TESTING W REFLEX): HIV 1&2 Ab, 4th Generation: NONREACTIVE

## 2017-11-01 NOTE — Assessment & Plan Note (Signed)
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  Discussed interventions to limit symptoms in the first place.  See above.  Continue Zofran as needed.  She agrees.  Check routine labs today.  See notes on labs.

## 2017-11-01 NOTE — Assessment & Plan Note (Signed)
Advance directive-mother designated if patient were incapacitated. 

## 2017-11-01 NOTE — Addendum Note (Signed)
Addended by: Alvina Chou on: 11/01/2017 08:06 AM   Modules accepted: Orders

## 2017-11-01 NOTE — Assessment & Plan Note (Signed)
See notes on labs. 

## 2017-11-02 LAB — WET PREP BY MOLECULAR PROBE
Candida species: NOT DETECTED
MICRO NUMBER: 90567624
SPECIMEN QUALITY: ADEQUATE
Trichomonas vaginosis: NOT DETECTED

## 2017-11-05 ENCOUNTER — Other Ambulatory Visit: Payer: Self-pay | Admitting: Family Medicine

## 2017-11-05 MED ORDER — METRONIDAZOLE 500 MG PO TABS: 500.0000 mg | ORAL_TABLET | Freq: Two times a day (BID) | ORAL | 0 refills | Status: DC

## 2017-12-26 ENCOUNTER — Emergency Department
Admission: EM | Admit: 2017-12-26 | Discharge: 2017-12-26 | Disposition: A | Discharge status: Home / Self Care | Payer: PRIVATE HEALTH INSURANCE | Attending: Student in an Organized Health Care Education/Training Program | Admitting: Student in an Organized Health Care Education/Training Program

## 2017-12-26 ENCOUNTER — Emergency Department: Payer: PRIVATE HEALTH INSURANCE

## 2017-12-26 ENCOUNTER — Other Ambulatory Visit: Payer: Self-pay

## 2017-12-26 DIAGNOSIS — R1011 Right upper quadrant pain: Secondary | ICD-10-CM

## 2017-12-26 DIAGNOSIS — N12 Tubulo-interstitial nephritis, not specified as acute or chronic: Secondary | ICD-10-CM | POA: Insufficient documentation

## 2017-12-26 DIAGNOSIS — Z79899 Other long term (current) drug therapy: Secondary | ICD-10-CM | POA: Insufficient documentation

## 2017-12-26 DIAGNOSIS — Z87891 Personal history of nicotine dependence: Secondary | ICD-10-CM | POA: Diagnosis not present

## 2017-12-26 LAB — CBC
HCT: 41.5 % (ref 35.0–47.0)
Hemoglobin: 14.5 g/dL (ref 12.0–16.0)
MCH: 31.3 pg (ref 26.0–34.0)
MCHC: 34.9 g/dL (ref 32.0–36.0)
MCV: 89.8 fL (ref 80.0–100.0)
PLATELETS: 228 10*3/uL (ref 150–440)
RBC: 4.62 MIL/uL (ref 3.80–5.20)
RDW: 13 % (ref 11.5–14.5)
WBC: 8.3 10*3/uL (ref 3.6–11.0)

## 2017-12-26 LAB — COMPREHENSIVE METABOLIC PANEL
ALK PHOS: 49 U/L (ref 38–126)
ALT: 14 U/L (ref 0–44)
AST: 20 U/L (ref 15–41)
Albumin: 4.5 g/dL (ref 3.5–5.0)
Anion gap: 8 (ref 5–15)
BILIRUBIN TOTAL: 0.8 mg/dL (ref 0.3–1.2)
BUN: 8 mg/dL (ref 6–20)
CALCIUM: 9.4 mg/dL (ref 8.9–10.3)
CO2: 25 mmol/L (ref 22–32)
Chloride: 108 mmol/L (ref 98–111)
Creatinine, Ser: 0.79 mg/dL (ref 0.44–1.00)
GFR calc Af Amer: >60 mL/min (ref >60)
Glucose, Bld: 95 mg/dL (ref 70–99)
Potassium: 3.9 mmol/L (ref 3.5–5.1)
Sodium: 141 mmol/L (ref 135–145)
TOTAL PROTEIN: 7.4 g/dL (ref 6.5–8.1)

## 2017-12-26 LAB — URINALYSIS, COMPLETE (UACMP) WITH MICROSCOPIC
Bilirubin Urine: NEGATIVE
GLUCOSE, UA: NEGATIVE mg/dL
Ketones, ur: 20 mg/dL — AB
NITRITE: NEGATIVE
PH: 6 (ref 5.0–8.0)
Protein, ur: 100 mg/dL — AB
RBC / HPF: >50 RBC/hpf — ABNORMAL HIGH (ref 0–5)
SPECIFIC GRAVITY, URINE: 1.016 (ref 1.005–1.030)
WBC, UA: >50 WBC/hpf — ABNORMAL HIGH (ref 0–5)

## 2017-12-26 LAB — LIPASE, BLOOD: Lipase: 22 U/L (ref 11–51)

## 2017-12-26 LAB — POCT PREGNANCY, URINE: Preg Test, Ur: NEGATIVE

## 2017-12-26 MED ORDER — HYDROCODONE-ACETAMINOPHEN 5-325 MG PO TABS: 1.0000 | ORAL_TABLET | ORAL | 0 refills | Status: DC | PRN

## 2017-12-26 MED ORDER — HYDROCODONE-ACETAMINOPHEN 5-325 MG PO TABS
ORAL_TABLET | ORAL | Status: AC
  Filled 2017-12-26: qty 1

## 2017-12-26 MED ORDER — PROMETHAZINE HCL 12.5 MG PO TABS: 12.5000 mg | ORAL_TABLET | Freq: Four times a day (QID) | ORAL | 0 refills | Status: DC | PRN

## 2017-12-26 MED ORDER — CEPHALEXIN 500 MG PO CAPS
500.0000 mg | ORAL_CAPSULE | Freq: Once | ORAL | Status: AC
  Administered 2017-12-26: 500 mg via ORAL
  Filled 2017-12-26: qty 1

## 2017-12-26 MED ORDER — KETOROLAC TROMETHAMINE 30 MG/ML IJ SOLN
INTRAMUSCULAR | Status: AC
  Filled 2017-12-26: qty 1

## 2017-12-26 MED ORDER — CEPHALEXIN 500 MG PO CAPS: 500.0000 mg | ORAL_CAPSULE | Freq: Three times a day (TID) | ORAL | 0 refills | Status: AC

## 2017-12-26 MED ORDER — CEFTRIAXONE SODIUM 1 G IJ SOLR: 1.0000 g | Freq: Once | INTRAMUSCULAR | Status: DC

## 2017-12-26 MED ORDER — FENTANYL CITRATE (PF) 100 MCG/2ML IJ SOLN
100.0000 ug | INTRAMUSCULAR | Status: DC | PRN
  Administered 2017-12-26: 100 ug via INTRAVENOUS
  Filled 2017-12-26: qty 2

## 2017-12-26 MED ORDER — SODIUM CHLORIDE 0.9 % IV BOLUS
1000.0000 mL | Freq: Once | INTRAVENOUS | Status: AC
  Administered 2017-12-26: 1000 mL via INTRAVENOUS

## 2017-12-26 MED ORDER — HYDROCODONE-ACETAMINOPHEN 5-325 MG PO TABS
1.0000 | ORAL_TABLET | Freq: Once | ORAL | Status: AC
  Administered 2017-12-26: 1 via ORAL

## 2017-12-26 MED ORDER — KETOROLAC TROMETHAMINE 30 MG/ML IJ SOLN
15.0000 mg | Freq: Once | INTRAMUSCULAR | Status: AC
  Administered 2017-12-26: 15 mg via INTRAVENOUS

## 2017-12-26 NOTE — ED Notes (Signed)
Pt alert and oriented X4, active, cooperative, pt in NAD. RR even and unlabored, color WNL.  Pt informed to return if any life threatening symptoms occur.  Discharge and followup instructions reviewed.  

## 2017-12-26 NOTE — ED Triage Notes (Signed)
Pt complains of drowsiness nausea abd crampoing, started last night pt is ambulatory and  NAD at this time.Pt has a hx of  EDS3 with pots.

## 2017-12-26 NOTE — ED Provider Notes (Signed)
[facility] Emergency Department Provider Note    First MD Initiated Contact with Patient 12/26/17 1328     (approximate)  I have reviewed the triage vital signs and the nursing notes.   HISTORY  Chief Complaint Abdominal Pain    HPI Monique Underwood is a 22 y.o. female with a history as listed below presents to the ER with nausea and right flank and right upper quadrant abdominal cramping and discomfort.  States that she has been on Cystex and cranberry pills for the past 3 days for urinary discomfort and increased frequency without improvement in symptoms.  Denies any fevers.  Has had some nausea but no vomiting.  Denies any chest pain or shortness of breath.  No pain radiating through to her back.    Past Medical History:  Diagnosis Date  . Anxiety   . EDS (Ehlers-Danlos syndrome)    EDS 3  . History of chlamydia infection 01/04/2017  . Migraine with aura   . Ovarian cyst   . POTS (postural orthostatic tachycardia syndrome)   . Rotavirus infection 1/25 - 07/22/02   Hospitalized  . Syncope    Family History  Problem Relation Age of Onset  . Thyroid disease Mother   . Cancer Other   . Diabetes Paternal Grandmother   . Breast cancer Neg Hx   . Ovarian cancer Neg Hx   . Colon cancer Neg Hx    Past Surgical History:  Procedure Laterality Date  . DILATION AND EVACUATION N/A 04/18/2016   Procedure: DILATATION AND EVACUATION;  Surgeon: Hildred Laser, MD;  Location: [facility] ORS;  Service: Gynecology;  Laterality: N/A;  . 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 Febrile seizure  7/99   [facility] - w/u negative  . MRI - head     NML   Patient Active Problem List   Diagnosis Date Noted  . Screen for STD (sexually transmitted disease) 11/01/2017  . Nexplanon in place 04/27/2017  . Nausea 04/22/2017  . SVT (supraventricular tachycardia) (HCC) 01/14/2015  . Racing heart beat 12/17/2014  . Syncope 12/11/2014  . Anxiety state 12/11/2014  . Advance care planning  01/17/2013  . ADD (attention deficit disorder) 06/24/2012  . FEBRILE SEIZURE, PROLONGED 10/29/2009      Prior to Admission medications   Medication Sig Start Date End Date Taking? Authorizing Provider  cephALEXin (KEFLEX) 500 MG capsule Take 1 capsule (500 mg total) by mouth 3 (three) times daily for 7 days. 12/26/17 01/02/18  Willy Eddy, MD  etonogestrel (NEXPLANON) 68 MG IMPL implant 1 each by Subdermal route once.    [provider]  HYDROcodone-acetaminophen (NORCO) 5-325 MG tablet Take 1 tablet by mouth every 4 (four) hours as needed for moderate pain. 12/26/17   Willy Eddy, MD  metoprolol tartrate (LOPRESSOR) 25 MG tablet Take 0.5-1 tablets (12.5-25 mg total) by mouth 2 (two) times daily as needed (for anxiety/elevated heart rate). 01/01/17   Joaquim Nam, MD  metroNIDAZOLE (FLAGYL) 500 MG tablet Take 1 tablet (500 mg total) by mouth 2 (two) times daily. 11/05/17   Joaquim Nam, MD  ondansetron (ZOFRAN-ODT) 4 MG disintegrating tablet TAKE 1 TABLET BY MOUTH EVERY 8 HOURS AS NEEDED FOR NAUSEA AND VOMITING 10/31/17   Joaquim Nam, MD  promethazine (PHENERGAN) 12.5 MG tablet Take 1 tablet (12.5 mg total) by mouth every 6 (six) hours as needed for nausea or vomiting. 12/26/17   Willy Eddy, MD    Allergies Shellfish allergy  and Lexapro [escitalopram oxalate]    Social History Social History   Tobacco Use  . Smoking status: Former Smoker    Packs/day: 0.50    Years: 3.00    Pack years: 1.50    Types: Cigarettes  . Smokeless tobacco: Never Used  Substance Use Topics  . Alcohol use: No    Alcohol/week: 0.0 oz  . Drug use: No    Review of Systems Patient denies headaches, rhinorrhea, blurry vision, numbness, shortness of breath, chest pain, edema, cough, abdominal pain, nausea, vomiting, diarrhea, dysuria, fevers, rashes or hallucinations unless otherwise stated above in HPI. ____________________________________________   PHYSICAL EXAM:  VITAL  SIGNS: Vitals:   12/26/17 1239 12/26/17 1410  BP: 125/73 (!) 123/91  Pulse: (!) 120 (!) 101  Resp: 19 14  Temp: 98.4 F (36.9 C)   SpO2: 100% 98%    Constitutional: Alert and oriented.  Eyes: Conjunctivae are normal.  Head: Atraumatic. Nose: No congestion/rhinnorhea. Mouth/Throat: Mucous membranes are moist.   Neck: No stridor. Painless ROM.  Cardiovascular: Normal rate, regular rhythm. Grossly normal heart sounds.  Good peripheral circulation. Respiratory: Normal respiratory effort.  No retractions. Lungs CTAB. Gastrointestinal: Soft with mild ruq ttp no rebound or guarding. No distention. No abdominal bruits. + right CVA tenderness. Genitourinary: deferred Musculoskeletal: No lower extremity tenderness nor edema.  No joint effusions. Neurologic:  Normal speech and language. No gross focal neurologic deficits are appreciated. No facial droop Skin:  Skin is warm, dry and intact. No rash noted. Psychiatric: Mood and affect are normal. Speech and behavior are normal.  ____________________________________________   LABS (all labs ordered are listed, but only abnormal results are displayed)  Results for orders placed or performed during the hospital encounter of 12/26/17 (from the past 24 hour(s))  Lipase, blood     Status: None   Collection Time: 12/26/17 12:44 PM  Result Value Ref Range   Lipase 22 11 - 51 U/L  Comprehensive metabolic panel     Status: None   Collection Time: 12/26/17 12:44 PM  Result Value Ref Range   Sodium 141 135 - 145 mmol/L   Potassium 3.9 3.5 - 5.1 mmol/L   Chloride 108 98 - 111 mmol/L   CO2 25 22 - 32 mmol/L   Glucose, Bld 95 70 - 99 mg/dL   BUN 8 6 - 20 mg/dL   Creatinine, Ser 1.61 0.44 - 1.00 mg/dL   Calcium 9.4 8.9 - 09.6 mg/dL   Total Protein 7.4 6.5 - 8.1 g/dL   Albumin 4.5 3.5 - 5.0 g/dL   AST 20 15 - 41 U/L   ALT 14 0 - 44 U/L   Alkaline Phosphatase 49 38 - 126 U/L   Total Bilirubin 0.8 0.3 - 1.2 mg/dL   GFR calc non Af Amer >60 >60  mL/min   GFR calc Af Amer >60 >60 mL/min   Anion gap 8 5 - 15  CBC     Status: None   Collection Time: 12/26/17 12:44 PM  Result Value Ref Range   WBC 8.3 3.6 - 11.0 K/uL   RBC 4.62 3.80 - 5.20 MIL/uL   Hemoglobin 14.5 12.0 - 16.0 g/dL   HCT 04.5 40.9 - 81.1 %   MCV 89.8 80.0 - 100.0 fL   MCH 31.3 26.0 - 34.0 pg   MCHC 34.9 32.0 - 36.0 g/dL   RDW 91.4 78.2 - 95.6 %   Platelets 228 150 - 440 K/uL  Urinalysis, Complete w Microscopic  Status: Abnormal   Collection Time: 12/26/17 12:44 PM  Result Value Ref Range   Color, Urine AMBER (A) YELLOW   APPearance TURBID (A) CLEAR   Specific Gravity, Urine 1.016 1.005 - 1.030   pH 6.0 5.0 - 8.0   Glucose, UA NEGATIVE NEGATIVE mg/dL   Hgb urine dipstick LARGE (A) NEGATIVE   Bilirubin Urine NEGATIVE NEGATIVE   Ketones, ur 20 (A) NEGATIVE mg/dL   Protein, ur 161 (A) NEGATIVE mg/dL   Nitrite NEGATIVE NEGATIVE   Leukocytes, UA SMALL (A) NEGATIVE   RBC / HPF >50 (H) 0 - 5 RBC/hpf   WBC, UA >50 (H) 0 - 5 WBC/hpf   Bacteria, UA MANY (A) NONE SEEN   Squamous Epithelial / LPF 21-50 0 - 5   Mucus PRESENT    Non Squamous Epithelial 0-5 (A) NONE SEEN  Pregnancy, urine POC     Status: None   Collection Time: 12/26/17  1:56 PM  Result Value Ref Range   Preg Test, Ur NEGATIVE NEGATIVE   ____________________________________________ ____________________________________________  RADIOLOGY  I personally reviewed all radiographic images ordered to evaluate for the above acute complaints and reviewed radiology reports and findings.  These findings were personally discussed with the patient.  Please see medical record for radiology report.  ____________________________________________   PROCEDURES  Procedure(s) performed:  Procedures    Critical Care performed: no ____________________________________________   INITIAL IMPRESSION / ASSESSMENT AND PLAN / ED COURSE  Pertinent labs & imaging results that were available during my care of  the patient were reviewed by me and considered in my medical decision making (see chart for details).   DDX: Cholelithiasis, cholecystitis, biliary colic, gastritis, pyelonephritis, kidney stone, UTI, appendicitis, colitis  Monique Underwood is a 22 y.o. who presents to the ED with symptoms as described above.  Patient mildly tachycardic but very anxious appearing she is afebrile and normotensive.  Does have some mild discomfort on exam therefore given location and history do believe that ultrasound of right upper quadrant indicated.  No evidence of leukocytosis or biliary obstruction.  Certainly possible pyelonephritis or kidney stone.  This does not seem clinically consistent with pelvic etiology or acute appendicitis.  Clinical Course as of Dec 26 1548  Wed Dec 26, 2017  1450 Urinalysis is consistent with UTI therefore will start on antibiotics.  No fever no leukocytosis therefore not clinically consistent with sepsis or bacteremia.   [PR]  1549 Renal ultrasound is reassuring does not show any evidence of abscess, hydro-or anatomical abnormality at this time.  Patient's pain is controlled.  Heart rate improved.  At this point do believe she stable and appropriate for outpatient follow-up.   [PR]    Clinical Course User Index [PR] Willy Eddy, MD     As part of my medical decision making, I reviewed the following data within the electronic MEDICAL RECORD NUMBER Nursing notes reviewed and incorporated, Labs reviewed, notes from prior ED visits.  ____________________________________________   FINAL CLINICAL IMPRESSION(S) / ED DIAGNOSES  Final diagnoses:  RUQ pain  Pyelonephritis      NEW MEDICATIONS STARTED DURING THIS VISIT:  New Prescriptions   CEPHALEXIN (KEFLEX) 500 MG CAPSULE    Take 1 capsule (500 mg total) by mouth 3 (three) times daily for 7 days.   HYDROCODONE-ACETAMINOPHEN (NORCO) 5-325 MG TABLET    Take 1 tablet by mouth every 4 (four) hours as needed for moderate pain.     PROMETHAZINE (PHENERGAN) 12.5 MG TABLET    Take 1 tablet (  12.5 mg total) by mouth every 6 (six) hours as needed for nausea or vomiting.     Note:  This document was prepared using Dragon voice recognition software and may include unintentional dictation errors.    Willy Eddy, MD 12/26/17 1550

## 2017-12-26 NOTE — Discharge Instructions (Signed)

## 2017-12-31 ENCOUNTER — Encounter: Payer: Self-pay | Admitting: Obstetrics and Gynecology

## 2017-12-31 ENCOUNTER — Encounter: Payer: Self-pay | Admitting: Family Medicine

## 2018-01-02 ENCOUNTER — Ambulatory Visit: Payer: PRIVATE HEALTH INSURANCE | Admitting: Obstetrics and Gynecology

## 2018-01-02 ENCOUNTER — Telehealth: Payer: Self-pay | Admitting: Obstetrics and Gynecology

## 2018-01-02 ENCOUNTER — Other Ambulatory Visit (HOSPITAL_COMMUNITY)
Admission: RE | Admit: 2018-01-02 | Discharge: 2018-01-02 | Disposition: A | Discharge status: Home / Self Care | Payer: PRIVATE HEALTH INSURANCE | Source: Ambulatory Visit | Attending: Obstetrics and Gynecology | Admitting: Obstetrics and Gynecology

## 2018-01-02 ENCOUNTER — Encounter: Payer: Self-pay | Admitting: Obstetrics and Gynecology

## 2018-01-02 VITALS — BP 97/63 | HR 93 | Ht 62.0 in | Wt 117.8 lb

## 2018-01-02 DIAGNOSIS — R109 Unspecified abdominal pain: Secondary | ICD-10-CM | POA: Diagnosis not present

## 2018-01-02 DIAGNOSIS — L292 Pruritus vulvae: Secondary | ICD-10-CM | POA: Diagnosis not present

## 2018-01-02 DIAGNOSIS — B373 Candidiasis of vulva and vagina: Secondary | ICD-10-CM | POA: Insufficient documentation

## 2018-01-02 DIAGNOSIS — R300 Dysuria: Secondary | ICD-10-CM

## 2018-01-02 DIAGNOSIS — R6883 Chills (without fever): Secondary | ICD-10-CM

## 2018-01-02 DIAGNOSIS — R10A2 Flank pain, left side: Secondary | ICD-10-CM

## 2018-01-02 HISTORY — DX: Dysuria: R30.0

## 2018-01-02 HISTORY — DX: Pruritus vulvae: L29.2

## 2018-01-02 LAB — POCT URINALYSIS DIPSTICK
BILIRUBIN UA: NEGATIVE
Blood, UA: NEGATIVE
GLUCOSE UA: NEGATIVE
Ketones, UA: NEGATIVE
Leukocytes, UA: NEGATIVE
Nitrite, UA: NEGATIVE
ODOR: NEGATIVE
PH UA: 6 (ref 5.0–8.0)
Protein, UA: NEGATIVE
Spec Grav, UA: 1.015 (ref 1.010–1.025)
Urobilinogen, UA: 0.2 E.U./dL

## 2018-01-02 MED ORDER — FLUCONAZOLE 150 MG PO TABS: 150.0000 mg | ORAL_TABLET | ORAL | 3 refills | Status: DC

## 2018-01-02 MED ORDER — NYSTATIN-TRIAMCINOLONE 100000-0.1 UNIT/GM-% EX CREA: 1.0000 "application " | TOPICAL_CREAM | Freq: Two times a day (BID) | CUTANEOUS | 0 refills | Status: DC

## 2018-01-02 NOTE — Patient Instructions (Signed)
1.  Urinalysis and urine culture is obtained 2.  CBC is obtained 3.  Nuswab for Candida, BV, GC, chlamydia 4.  Diflucan 150 mg orally every 3 days for 3 doses 5.  Mycolog cream to be applied topically to the vulva twice a day for 10 to 14 days 6.  Acetaminophen 1 g 4 times a day and ibuprofen 600 mg 4 times a day for flank pain and fever 7.  Return in 7 days if symptoms persist or worsen

## 2018-01-02 NOTE — Progress Notes (Signed)
Chief complaint: 1.  Left flank pain 2.  Dysuria 3.  Vulvar itching  Status post ER evaluation on 12/26/2017 with subsequent diagnosis of pyelonephritis.  Treated with Keflex x7 days.  Patient has 1 more tablet to take to complete treatment.  She continues to have intermittent left flank pain, chills without sweats, and vulvovaginal itching and dysuria.  She thought that she saw blood in her urine this morning before coming in for evaluation.  Review of recent radiologic studies 12/26/2017: Renal ultrasound-normal Flatplate abdomen-normal Ultrasound abdomen right upper quadrant-normal  Review of recent laboratory studies: CBC Latest Ref Rng & Units 12/26/2017 10/31/2017 12/20/2016  WBC 3.6 - 11.0 K/uL 8.3 11.1(H) 9.2  Hemoglobin 12.0 - 16.0 g/dL 16.1 09.6 04.5  Hematocrit 35.0 - 47.0 % 41.5 40.2 40.0  Platelets 150 - 440 K/uL 228 262.0 217  12/26/2017 urinalysis-large blood; greater than 50 white blood cells; greater than 50 red blood cells; many bacteria; 21-50 squames 12/26/2017 pregnancy test-negative  Past Medical History:  Diagnosis Date  . Anxiety   . EDS (Ehlers-Danlos syndrome)    EDS 3  . History of chlamydia infection 01/04/2017  . Migraine with aura   . Ovarian cyst   . POTS (postural orthostatic tachycardia syndrome)   . Rotavirus infection 1/25 - 07/22/02   Hospitalized  . Syncope    Past Surgical History:  Procedure Laterality Date  . DILATION AND EVACUATION N/A 04/18/2016   Procedure: DILATATION AND EVACUATION;  Surgeon: Hildred Laser, MD;  Location: [facility] ORS;  Service: Gynecology;  Laterality: N/A;  . 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 Febrile seizure  7/99   [facility] - w/u negative  . MRI - head     NML   Review of systems: Comprehensive review of systems is negative except for that noted in HPI  OBJECTIVE: BP 97/63   Pulse 93   Ht 5\' 2"  (1.575 m)   Wt 117 lb 12.8 oz (53.4 kg)   LMP 12/24/2017 Comment: nexplanon  BMI 21.55 kg/m  Pleasant slightly anxious female no acute  distress.  Alert and oriented. Back: Mild left-sided flank tenderness; right side nontender Abdomen: Flat, soft, mild tenderness in all 4 quadrants without guarding; no palpable mass organomegaly Bladder: Nontender Pelvic exam: External genitalia-mild hyperemia BUS-normal Vagina-brownish-red menstrual blood in vault; no odor Cervix-no lesions; minimal cervical motion tenderness 1/4 Uterus-small mobile midplane, tender 1/4 Adnexa-nonpalpable nontender Rectovaginal-normal external exam Extremities: Warm and dry without edema Skin: No rash  ASSESSMENT: 1.  Completing treatment for suspected pyelonephritis, one more antibiotic capsule (Keflex) 2.  Persistent left flank pain, unclear etiology 3.  Dysuria with vulvovaginal itching 4.  Chills without documented fever  PLAN: 1.  CBC 2.  UA with CNS 3.  Diflucan 150 mg orally every 3 days x 3 doses 4.  Mycolog cream topically to the vulva twice a day for 10 to 14 days 5.  Extra strength Tylenol 1 g daily and ibuprofen 600 mg 4 times daily for flank pain 6.  Contact office if symptoms persist or worsen within the next 7 to 10 days  Herold Harms, MD  Note: This dictation was prepared with Dragon dictation along with smaller phrase technology. Any transcriptional errors that result from this process are unintentional.

## 2018-01-02 NOTE — Telephone Encounter (Signed)
I spoke with the patient and let her know that Geraldo Pitter wants her to come into the office and do a urine drop off. The patient stated that she spoke with the on call nurse and they advised her to make an appointment. I asked the patient to stay on hold so I could contact the pt's nurse. When I picked the line back up the patient did not say anything, the call was ended. I called the patient back and left a voicemail. Please advise.

## 2018-01-02 NOTE — Telephone Encounter (Signed)
Pt was called to see if she received the MyChart message that was sent to her about if she could see AC at 2:30pm but she called the office and made an appt with MD at 2:30pm.

## 2018-01-03 LAB — CBC
HEMOGLOBIN: 13.8 g/dL (ref 11.1–15.9)
Hematocrit: 41.4 % (ref 34.0–46.6)
MCH: 29.8 pg (ref 26.6–33.0)
MCHC: 33.3 g/dL (ref 31.5–35.7)
MCV: 89 fL (ref 79–97)
PLATELETS: 256 10*3/uL (ref 150–450)
RBC: 4.63 x10E6/uL (ref 3.77–5.28)
RDW: 13.2 % (ref 12.3–15.4)
WBC: 7.5 10*3/uL (ref 3.4–10.8)

## 2018-01-03 LAB — CERVICOVAGINAL ANCILLARY ONLY
Bacterial vaginitis: NEGATIVE
CHLAMYDIA, DNA PROBE: NEGATIVE
Candida vaginitis: POSITIVE — AB
Neisseria Gonorrhea: NEGATIVE
Trichomonas: NEGATIVE

## 2018-01-04 LAB — URINE CULTURE: Organism ID, Bacteria: NO GROWTH

## 2018-01-09 ENCOUNTER — Ambulatory Visit: Payer: PRIVATE HEALTH INSURANCE | Admitting: Family Medicine

## 2018-02-07 ENCOUNTER — Encounter: Payer: PRIVATE HEALTH INSURANCE | Admitting: Obstetrics and Gynecology

## 2018-02-08 ENCOUNTER — Other Ambulatory Visit: Payer: Self-pay

## 2018-02-08 MED ORDER — METRONIDAZOLE 500 MG PO TABS: 500.0000 mg | ORAL_TABLET | Freq: Two times a day (BID) | ORAL | 0 refills | Status: DC

## 2018-02-15 ENCOUNTER — Other Ambulatory Visit: Payer: Self-pay | Admitting: Family Medicine

## 2018-02-15 NOTE — Telephone Encounter (Signed)
Electronic refill request. Metoprolol Last office visit:   10/31/17 Last Filled:    60 tablet 1 01/01/2017  Please advise.

## 2018-02-17 NOTE — Telephone Encounter (Signed)
Sent. Thanks.   

## 2018-04-09 ENCOUNTER — Ambulatory Visit: Payer: PRIVATE HEALTH INSURANCE | Admitting: Family

## 2018-04-09 ENCOUNTER — Ambulatory Visit (INDEPENDENT_AMBULATORY_CARE_PROVIDER_SITE_OTHER)
Admission: RE | Admit: 2018-04-09 | Discharge: 2018-04-09 | Disposition: A | Discharge status: Home / Self Care | Payer: PRIVATE HEALTH INSURANCE | Source: Ambulatory Visit | Attending: Family | Admitting: Family

## 2018-04-09 ENCOUNTER — Ambulatory Visit: Payer: Self-pay

## 2018-04-09 ENCOUNTER — Encounter: Payer: Self-pay | Admitting: Family

## 2018-04-09 VITALS — BP 102/68 | HR 111 | Temp 98.6°F | Ht 62.0 in | Wt 117.0 lb

## 2018-04-09 DIAGNOSIS — I951 Orthostatic hypotension: Secondary | ICD-10-CM

## 2018-04-09 DIAGNOSIS — Q796 Ehlers-Danlos syndrome, unspecified: Secondary | ICD-10-CM | POA: Diagnosis not present

## 2018-04-09 DIAGNOSIS — M79641 Pain in right hand: Secondary | ICD-10-CM | POA: Diagnosis not present

## 2018-04-09 DIAGNOSIS — R000 Tachycardia, unspecified: Secondary | ICD-10-CM

## 2018-04-09 DIAGNOSIS — G90A Postural orthostatic tachycardia syndrome (POTS): Secondary | ICD-10-CM | POA: Insufficient documentation

## 2018-04-09 MED ORDER — NAPROXEN 500 MG PO TABS: 500.0000 mg | ORAL_TABLET | Freq: Two times a day (BID) | ORAL | 0 refills | Status: DC

## 2018-04-09 NOTE — Telephone Encounter (Signed)
Returned call to patient who has C/O pain and a bump to her Rt pinky finger. She denys injury to the finger.  The bump appears red but she denies that it looks infected. No fever. Swelling is confined to the finger. She rates pain at 8. Stiff and sore to move. Pt will go to urgent care tonight after work. No appointments available today with the practice. She needs appointment after work. Care advice given to patient. Pt verbalized understanding.   Reason for Disposition . MODERATE hand swelling (e.g., visible swelling of hand and fingers; pitting edema)  Answer Assessment - Initial Assessment Questions 1. ONSET: "When did the swelling start?" (e.g., minutes, hours, days)     Two days ago 2. LOCATION: "What part of the hand is swollen?"  "Are both hands swollen or just one hand?"     Rt pinky finger 3. SEVERITY: "How bad is the swelling?" (e.g., localized; mild, moderate, severe)   - BALL OR LUMP: small ball or lump   - LOCALIZED: puffy or swollen area or patch of skin   - JOINT SWELLING: swelling of a joint   - MILD: puffiness or mild swelling of fingers or hand   - MODERATE: fingers and hand are swollen   - SEVERE: swelling of entire hand and up into forearm     Small lump on knuckle 4. REDNESS: "Does the swelling look red or infected?"    Red 5. PAIN: "Is the swelling painful to touch?" If so, ask: "How painful is it?"   (Scale 1-10; mild, moderate or severe)     8 6. FEVER: "Do you have a fever?" If so, ask: "What is it, how was it measured, and when did it start?"   no 7. CAUSE: "What do you think is causing the hand swelling?" (e.g., heat, insect bite, pregnancy, recent injury)   No  8. MEDICAL HISTORY: "Do you have a history of heart failure, kidney disease, liver failure, or cancer?"     Eds3 pots syndrome 9. RECURRENT SYMPTOM: "Have you had hand swelling before?" If so, ask: "When was the last time?" "What happened that time?"     Injured in past hair line fx 10. OTHER  SYMPTOMS: "Do you have any other symptoms?" (e.g., blurred vision, difficulty breathing, headache)      no 11. PREGNANCY: "Is there any chance you are pregnant?" "When was your last menstrual period?"       Now menses  Protocols used: HAND [facility]

## 2018-04-09 NOTE — Progress Notes (Signed)
Monique Underwood is a 22 y.o. female with the following history as recorded in EpicCare:  Patient Active Problem List   Diagnosis Date Noted  . EDS (Ehlers-Danlos syndrome) 04/09/2018  . POTS (postural orthostatic tachycardia syndrome) 04/09/2018  . Burning with urination 01/02/2018  . Vulvar itching 01/02/2018  . Left flank pain 01/02/2018  . Chills (without fever) 01/02/2018  . Screen for STD (sexually transmitted disease) 11/01/2017  . Nexplanon in place 04/27/2017  . Nausea 04/22/2017  . SVT (supraventricular tachycardia) (HCC) 01/14/2015  . Racing heart beat 12/17/2014  . Syncope 12/11/2014  . Anxiety state 12/11/2014  . Advance care planning 01/17/2013  . ADD (attention deficit disorder) 06/24/2012  . FEBRILE SEIZURE, PROLONGED 10/29/2009    Current Outpatient Medications  Medication Sig Dispense Refill  . etonogestrel (NEXPLANON) 68 MG IMPL implant 1 each by Subdermal route once.    . metoprolol tartrate (LOPRESSOR) 25 MG tablet TAKE HALF TO ONE TABLET BY MOUTH 2 (TWO) TIMES DAILY AS NEEDED(FOR ANXIETY/ELEVATED HEART RATE 180 tablet 1  . ondansetron (ZOFRAN-ODT) 4 MG disintegrating tablet TAKE 1 TABLET BY MOUTH EVERY 8 HOURS AS NEEDED FOR NAUSEA AND VOMITING 30 tablet 2  . naproxen (NAPROSYN) 500 MG tablet Take 1 tablet (500 mg total) by mouth 2 (two) times daily with a meal. 20 tablet 0   No current facility-administered medications for this visit.     Allergies: Shellfish allergy and Lexapro [escitalopram oxalate]  Past Medical History:  Diagnosis Date  . Anxiety   . EDS (Ehlers-Danlos syndrome)    EDS 3  . History of chlamydia infection 01/04/2017  . Migraine with aura   . Ovarian cyst   . POTS (postural orthostatic tachycardia syndrome)   . Rotavirus infection 1/25 - 07/22/02   Hospitalized  . Syncope     Past Surgical History:  Procedure Laterality Date  . DILATION AND EVACUATION N/A 04/18/2016   Procedure: DILATATION AND EVACUATION;  Surgeon: Hildred Laser,  MD;  Location: [facility] ORS;  Service: Gynecology;  Laterality: N/A;  . 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 Febrile seizure  7/99   [facility] - w/u negative  . MRI - head     NML    Family History  Problem Relation Age of Onset  . Thyroid disease Mother   . Cancer Other   . Diabetes Paternal Grandmother   . Breast cancer Neg Hx   . Ovarian cancer Neg Hx   . Colon cancer Neg Hx     Social History   Tobacco Use  . Smoking status: Former Smoker    Packs/day: 0.50    Years: 3.00    Pack years: 1.50    Types: Cigarettes  . Smokeless tobacco: Never Used  Substance Use Topics  . Alcohol use: No    Alcohol/week: 0.0 standard drinks    Subjective:  Patient presents with one day history of pain/ swelling in 5th finger right hand; denies any injury or trauma; notes that she woke up and symptoms were present; feels pain radiating into her right wrist; has used Tylenol and ice with some benefit today; mentions that her cardiologist feels she has EDS Type 3 and POTS syndrome;    Objective:  Vitals:   04/09/18 1532  BP: 102/68  Pulse: (!) 111  Temp: 98.6 F (37 C)  TempSrc: Oral  SpO2: 99%  Weight: 117 lb 0.6 oz (53.1 kg)  Height: 5\' 2"  (1.575 m)    General: Well developed, well nourished, in  no acute distress  Skin : Warm and dry.  Head: Normocephalic and atraumatic  Lungs: Respirations unlabored; clear to auscultation bilaterally without wheeze, rales, rhonchi  Musculoskeletal: No deformities; no active joint inflammation  Extremities: No edema, cyanosis, clubbing  Vessels: Symmetric bilaterally  Neurologic: Alert and oriented; speech intact; face symmetrical; moves all extremities well; CNII-XII intact without focal deficit   Assessment:  1. Pain of right hand   2. EDS (Ehlers-Danlos syndrome)   3. POTS (postural orthostatic tachycardia syndrome)     Plan:   Suspect muscular; due to suspected history of EDS, will update x-ray to make sure joints are stable; trial of Naproxen 500 mg  bid; apply ice, rest; follow-up worse, no better.   No follow-ups on file.  Orders Placed This Encounter  Procedures  . DG Hand Complete Right    Standing Status:   Future    Standing Expiration Date:   06/10/2019    Order Specific Question:   Reason for Exam (SYMPTOM  OR DIAGNOSIS REQUIRED)    Answer:   right hand pain    Order Specific Question:   Is patient pregnant?    Answer:   No    Order Specific Question:   Preferred imaging location?    Answer:   [facility]    Order Specific Question:   Radiology Contrast Protocol - do NOT remove file path    Answer:   [email or web address]    Requested Prescriptions   Signed Prescriptions Disp Refills  . naproxen (NAPROSYN) 500 MG tablet 20 tablet 0    Sig: Take 1 tablet (500 mg total) by mouth 2 (two) times daily with a meal.

## 2018-04-10 ENCOUNTER — Other Ambulatory Visit: Payer: Self-pay | Admitting: Family

## 2018-04-10 ENCOUNTER — Telehealth: Payer: Self-pay | Admitting: Family

## 2018-04-10 ENCOUNTER — Telehealth: Payer: Self-pay | Admitting: Family Medicine

## 2018-04-10 ENCOUNTER — Encounter: Payer: Self-pay | Admitting: Family Medicine

## 2018-04-10 NOTE — Telephone Encounter (Signed)
Sent patient message via my-chart that xray was normal and to please let me know what questions or concerns she had.

## 2018-04-10 NOTE — Telephone Encounter (Signed)
Noted.  Thanks.  I will await that note.

## 2018-04-10 NOTE — Telephone Encounter (Signed)
I can write a note to indicate that she was here yesterday. If she does not feel like she can go back to work, she needs to see her PCP in follow up.

## 2018-04-10 NOTE — Telephone Encounter (Signed)
Copied from CRM [id number]. Topic: General - Other >> Apr 10, 2018  8:44 AM Percival Spanish wrote:  Pt was seen there yesterday and is asking for a work note to be faxed to [phone] Attn Gearldine Bienenstock

## 2018-04-10 NOTE — Telephone Encounter (Signed)
Spoke with patient. Explained to her that xray was normal and that Vernona Rieger is recommending that she see sports med for further eval or she could follow back up with her PCP to discuss since we saw her for an acute visit. Patient was told more than once that her xray was normal and that Vernona Rieger didn't see any concern for anything abnormal. She mentioned the pain in her pinky and wrist pain and was again offered appointment to see sports med or see her pcp for further eval. She said she would get back with me and let me know how she wished to proceed.

## 2018-04-10 NOTE — Telephone Encounter (Signed)
Copied from CRM [id number]. Topic: General - Other >> Apr 10, 2018 10:05 AM Tamela Oddi wrote: Reason for CRM: Patient call to speak with the nurse or doctor regarding the findings on her x-ray yesterday, 04/09/18.  Patient stated she saw the results on My Chart but has a few questions.  Please advise.  CB# [phone].

## 2018-04-10 NOTE — Telephone Encounter (Signed)
My chart message sent to patient with info. Note faxed today.

## 2018-04-30 ENCOUNTER — Encounter: Payer: 59 | Admitting: Obstetrics and Gynecology

## 2018-05-10 ENCOUNTER — Ambulatory Visit (INDEPENDENT_AMBULATORY_CARE_PROVIDER_SITE_OTHER): Payer: PRIVATE HEALTH INSURANCE | Admitting: Obstetrics and Gynecology

## 2018-05-10 ENCOUNTER — Encounter: Payer: Self-pay | Admitting: Obstetrics and Gynecology

## 2018-05-10 VITALS — BP 105/68 | HR 102 | Ht 68.0 in | Wt 122.0 lb

## 2018-05-10 DIAGNOSIS — K640 First degree hemorrhoids: Secondary | ICD-10-CM | POA: Diagnosis not present

## 2018-05-10 DIAGNOSIS — Z01419 Encounter for gynecological examination (general) (routine) without abnormal findings: Secondary | ICD-10-CM

## 2018-05-10 DIAGNOSIS — Z975 Presence of (intrauterine) contraceptive device: Secondary | ICD-10-CM

## 2018-05-10 MED ORDER — LIDOCAINE HCL URETHRAL/MUCOSAL 2 % EX GEL: 1.0000 "application " | CUTANEOUS | 2 refills | Status: DC | PRN

## 2018-05-10 MED ORDER — HYDROCORTISONE 2.5 % RE CREA: 1.0000 "application " | TOPICAL_CREAM | Freq: Two times a day (BID) | RECTAL | 0 refills | Status: DC

## 2018-05-10 NOTE — Progress Notes (Signed)
GYNECOLOGY ANNUAL PHYSICAL EXAM PROGRESS NOTE  Subjective:    Monique Underwood is a 22 y.o. [id number] female who presents for an annual exam. The patient is sexually active.  The patient wears seatbelts: yes. The patient participates in regular exercise: no. Has the patient ever been transfused or tattooed?: yes (professionally done). The patient reports that there is not domestic violence in her life.   The patient has the following complaints today:  1. Patient reports complaints of of dealing with hemorrhoids off and on for the past 1-2 years. Notes that she has been using Preparation- H without much relief lately. Reports that it causes discomfort with intercourse at times.   Gynecologic History  Menarche age: 56 Patient's last menstrual period was 05/03/2018. Contraception: Nexplanon (inserted 02/21/2017)  History of STI's: H/o chlamydia 12/2016, treated Last ZOX:WRUEAVW has never had one.    OB History  Gravida Para Term Preterm AB Living  2 0 0 0 2 0  SAB TAB Ectopic Multiple Live Births  1 1 0 0 0    # Outcome Date GA Lbr Len/2nd Weight Sex Delivery Anes PTL Lv  2 TAB 2017          1 SAB  [redacted]w[redacted]d            Birth Comments: D&C    Past Medical History:  Diagnosis Date  . Anxiety   . EDS (Ehlers-Danlos syndrome)    EDS 3  . History of chlamydia infection 01/04/2017  . Migraine with aura   . Ovarian cyst   . POTS (postural orthostatic tachycardia syndrome)   . Rotavirus infection 1/25 - 07/22/02   Hospitalized  . Syncope     Past Surgical History:  Procedure Laterality Date  . DILATION AND EVACUATION N/A 04/18/2016   Procedure: DILATATION AND EVACUATION;  Surgeon: Hildred Laser, MD;  Location: [facility] ORS;  Service: Gynecology;  Laterality: N/A;  . 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 Febrile seizure  7/99   [facility] - w/u negative  . MRI - head     NML    Family History  Problem Relation Age of Onset  . Thyroid disease Mother   . Ehlers-Danlos syndrome Father   . Cancer  Other   . Diabetes Paternal Grandmother   . Breast cancer Neg Hx   . Ovarian cancer Neg Hx   . Colon cancer Neg Hx     Social History   Socioeconomic History  . Marital status: Single    Spouse name: Not on file  . Number of children: Not on file  . Years of education: Not on file  . Highest education level: Not on file  Occupational History  . Not on file  Social Needs  . Financial resource strain: Not on file  . Food insecurity:    Worry: Not on file    Inability: Not on file  . Transportation needs:    Medical: Not on file    Non-medical: Not on file  Tobacco Use  . Smoking status: Former Smoker    Packs/day: 0.50    Years: 3.00    Pack years: 1.50    Types: Cigarettes  . Smokeless tobacco: Never Used  Substance and Sexual Activity  . Alcohol use: Yes    Alcohol/week: 0.0 standard drinks    Comment: occass  . Drug use: No  . Sexual activity: Yes    Birth control/protection: Implant    Comment: Nexplanon inserted 02/21/17  Lifestyle  .  Physical activity:    Days per week: Not on file    Minutes per session: Not on file  . Stress: Not on file  Relationships  . Social connections:    Talks on phone: Not on file    Gets together: Not on file    Attends religious service: Not on file    Active member of club or organization: Not on file    Attends meetings of clubs or organizations: Not on file    Relationship status: Not on file  . Intimate partner violence:    Fear of current or ex partner: Not on file    Emotionally abused: Not on file    Physically abused: Not on file    Forced sexual activity: Not on file  Other Topics Concern  . Not on file  Social History Narrative   Lives with boyfriend   Working at [employer] in [place]    Current Outpatient Medications on File Prior to Visit  Medication Sig Dispense Refill  . etonogestrel (NEXPLANON) 68 MG IMPL implant 1 each by Subdermal route once.    . metoprolol tartrate (LOPRESSOR) 25 MG tablet TAKE  HALF TO ONE TABLET BY MOUTH 2 (TWO) TIMES DAILY AS NEEDED(FOR ANXIETY/ELEVATED HEART RATE 180 tablet 1  . naproxen (NAPROSYN) 500 MG tablet Take 1 tablet (500 mg total) by mouth 2 (two) times daily with a meal. 20 tablet 0  . ondansetron (ZOFRAN-ODT) 4 MG disintegrating tablet TAKE 1 TABLET BY MOUTH EVERY 8 HOURS AS NEEDED FOR NAUSEA AND VOMITING 30 tablet 2   No current facility-administered medications on file prior to visit.     Allergies  Allergen Reactions  . Shellfish Allergy Anaphylaxis and Hives  . Lexapro [Escitalopram Oxalate] Other (See Comments)    GI upset.      Review of Systems Constitutional: negative for chills, fatigue, fevers and sweats Eyes: negative for irritation, redness and visual disturbance Ears, nose, mouth, throat, and face: negative for hearing loss, nasal congestion, snoring and tinnitus Respiratory: negative for asthma, cough, sputum Cardiovascular: negative for chest pain, dyspnea, exertional chest pressure/discomfort, irregular heart beat, palpitations and syncope Gastrointestinal: negative for abdominal pain, change in bowel habits, nausea and vomiting Genitourinary: negative for abnormal menstrual periods, genital lesions, sexual problems and vaginal discharge, dysuria and urinary incontinence Integument/breast: negative for breast lump, breast tenderness and nipple discharge Hematologic/lymphatic: negative for bleeding and easy bruising Musculoskeletal:negative for back pain and muscle weakness Neurological: negative for dizziness, headaches, vertigo and weakness Endocrine: negative for diabetic symptoms including polydipsia, polyuria and skin dryness Allergic/Immunologic: negative for hay fever and urticaria        Objective:  Blood pressure 105/68, pulse (!) 102, height 5\' 8"  (1.727 m), weight 122 lb (55.3 kg), last menstrual period 05/03/2018. Body mass index is 18.55 kg/m.  General Appearance:    Alert, cooperative, no distress, appears  stated age  Head:    Normocephalic, without obvious abnormality, atraumatic  Eyes:    PERRL, conjunctiva/corneas clear, EOM's intact, both eyes  Ears:    Normal external ear canals, both ears  Nose:   Nares normal, septum midline, mucosa normal, no drainage or sinus tenderness  Throat:   Lips, mucosa, and tongue normal; teeth and gums normal  Neck:   Supple, symmetrical, trachea midline, no adenopathy; thyroid: no enlargement/tenderness/nodules; no carotid bruit or JVD  Back:     Symmetric, no curvature, ROM normal, no CVA tenderness  Lungs:     Clear to auscultation bilaterally, respirations unlabored  Chest Wall:    No tenderness or deformity   Heart:    Regular rate and rhythm, S1 and S2 normal, no murmur, rub or gallop  Breast Exam:    No tenderness, masses, or nipple abnormality  Abdomen:     Soft, non-tender, bowel sounds active all four quadrants, no masses, no organomegaly.    Genitalia:    Pelvic:external genitalia normal, vagina without lesions, discharge, or tenderness, rectovaginal septum  normal. Cervix normal in appearance, no cervical motion tenderness, no adnexal masses or tenderness.  Uterus normal size, shape, mobile, regular contours, nontender.  Rectal:    Normal external sphincter.  Small hemorrhoid noted with residual skin tag at 12 o'clock. Internal exam not done.   Extremities:   Extremities normal, atraumatic, no cyanosis or edema  Pulses:   2+ and symmetric all extremities  Skin:   Skin color, texture, turgor normal, no rashes or lesions  Lymph nodes:   Cervical, supraclavicular, and axillary nodes normal  Neurologic:   CNII-XII intact, normal strength, sensation and reflexes throughout   .  Labs:  Lab Results  Component Value Date   WBC 7.5 01/02/2018   HGB 13.8 01/02/2018   HCT 41.4 01/02/2018   MCV 89 01/02/2018   PLT 256 01/02/2018    Lab Results  Component Value Date   CREATININE 0.79 12/26/2017   BUN 8 12/26/2017   NA 141 12/26/2017   K 3.9  12/26/2017   CL 108 12/26/2017   CO2 25 12/26/2017    Lab Results  Component Value Date   ALT 14 12/26/2017   AST 20 12/26/2017   ALKPHOS 49 12/26/2017   BILITOT 0.8 12/26/2017    Lab Results  Component Value Date   TSH 0.82 12/16/2014     Assessment:    Healthy female exam.   Hemorrhoids  Nexplanon in place  Plan:     Blood tests: up to date. Breast self exam technique reviewed and patient encouraged to perform self-exam monthly. Contraception: Nexplanon. Due for removal 01/2020.  Discussed healthy lifestyle modifications.  Pap smear up to date.  Next due in 2021. Declines STI screening. Still with same partner.  Declines flu vaccine.  Discussed further management for hemorrhoids, including Tucks pads, witch hazel, increasing fluid intake and fiber, and sitz baths. Also prescribed Anusol and lidocaine gel for relief.  Discussed HPV vaccine again, handouts given.     Hildred Laser, MD [facility]

## 2018-05-10 NOTE — Progress Notes (Signed)
PT is present today for her annual exam. Pt stated that she has been doing self-breast exams monthly. Pt stated that she is doing well and denies any issues. No problems or concerns.     

## 2018-05-10 NOTE — Patient Instructions (Signed)
Hemorrhoids Hemorrhoids are swollen veins in and around the rectum or anus. There are two types of hemorrhoids:  Internal hemorrhoids. These occur in the veins that are just inside the rectum. They may poke through to the outside and become irritated and painful.  External hemorrhoids. These occur in the veins that are outside of the anus and can be felt as a painful swelling or hard lump near the anus.  Most hemorrhoids do not cause serious problems, and they can be managed with home treatments such as diet and lifestyle changes. If home treatments do not help your symptoms, procedures can be done to shrink or remove the hemorrhoids. What are the causes? This condition is caused by increased pressure in the anal area. This pressure may result from various things, including:  Constipation.  Straining to have a bowel movement.  Diarrhea.  Pregnancy.  Obesity.  Sitting for long periods of time.  Heavy lifting or other activity that causes you to strain.  Anal sex.  What are the signs or symptoms? Symptoms of this condition include:  Pain.  Anal itching or irritation.  Rectal bleeding.  Leakage of stool (feces).  Anal swelling.  One or more lumps around the anus.  How is this diagnosed? This condition can often be diagnosed through a visual exam. Other exams or tests may also be done, such as:  Examination of the rectal area with a gloved hand (digital rectal exam).  Examination of the anal canal using a small tube (anoscope).  A blood test, if you have lost a significant amount of blood.  A test to look inside the colon (sigmoidoscopy or colonoscopy).  How is this treated? This condition can usually be treated at home. However, various procedures may be done if dietary changes, lifestyle changes, and other home treatments do not help your symptoms. These procedures can help make the hemorrhoids smaller or remove them completely. Some of these procedures involve  surgery, and others do not. Common procedures include:  Rubber band ligation. Rubber bands are placed at the base of the hemorrhoids to cut off the blood supply to them.  Sclerotherapy. Medicine is injected into the hemorrhoids to shrink them.  Infrared coagulation. A type of light energy is used to get rid of the hemorrhoids.  Hemorrhoidectomy surgery. The hemorrhoids are surgically removed, and the veins that supply them are tied off.  Stapled hemorrhoidopexy surgery. A circular stapling device is used to remove the hemorrhoids and use staples to cut off the blood supply to them.  Follow these instructions at home: Eating and drinking  Eat foods that have a lot of fiber in them, such as whole grains, beans, nuts, fruits, and vegetables. Ask your health care provider about taking products that have added fiber (fiber supplements).  Drink enough fluid to keep your urine clear or pale yellow. Managing pain and swelling  Take warm sitz baths for 20 minutes, 3-4 times a day to ease pain and discomfort.  If directed, apply ice to the affected area. Using ice packs between sitz baths may be helpful. ? Put ice in a plastic bag. ? Place a towel between your skin and the bag. ? Leave the ice on for 20 minutes, 2-3 times a day. General instructions  Take over-the-counter and prescription medicines only as told by your health care provider.  Use medicated creams or suppositories as told.  Exercise regularly.  Go to the bathroom when you have the urge to have a bowel movement. Do not wait.  Avoid straining to have bowel movements.  Keep the anal area dry and clean. Use wet toilet paper or moist towelettes after a bowel movement.  Do not sit on the toilet for long periods of time. This increases blood pooling and pain. Contact a health care provider if:  You have increasing pain and swelling that are not controlled by treatment or medicine.  You have uncontrolled bleeding.  You  have difficulty having a bowel movement, or you are unable to have a bowel movement.  You have pain or inflammation outside the area of the hemorrhoids. This information is not intended to replace advice given to you by your health care provider. Make sure you discuss any questions you have with your health care provider. Document Released: 06/09/2000 Document Revised: 11/10/2015 Document Reviewed: 02/24/2015 Elsevier Interactive Patient Education  2018 [place] Maintenance, Female Adopting a healthy lifestyle and getting preventive care can go a long way to promote health and wellness. Talk with your health care provider about what schedule of regular examinations is right for you. This is a good chance for you to check in with your provider about disease prevention and staying healthy. In between checkups, there are plenty of things you can do on your own. Experts have done a lot of research about which lifestyle changes and preventive measures are most likely to keep you healthy. Ask your health care provider for more information. Weight and diet Eat a healthy diet  Be sure to include plenty of vegetables, fruits, low-fat dairy products, and lean protein.  Do not eat a lot of foods high in solid fats, added sugars, or salt.  Get regular exercise. This is one of the most important things you can do for your health. ? Most adults should exercise for at least 150 minutes each week. The exercise should increase your heart rate and make you sweat (moderate-intensity exercise). ? Most adults should also do strengthening exercises at least twice a week. This is in addition to the moderate-intensity exercise.  Maintain a healthy weight  Body mass index (BMI) is a measurement that can be used to identify possible weight problems. It estimates body fat based on height and weight. Your health care provider can help determine your BMI and help you achieve or maintain a healthy  weight.  For females 107 years of age and older: ? A BMI below 18.5 is considered underweight. ? A BMI of 18.5 to 24.9 is normal. ? A BMI of 25 to 29.9 is considered overweight. ? A BMI of 30 and above is considered obese.  Watch levels of cholesterol and blood lipids  You should start having your blood tested for lipids and cholesterol at 22 years of age, then have this test every 5 years.  You may need to have your cholesterol levels checked more often if: ? Your lipid or cholesterol levels are high. ? You are older than 22 years of age. ? You are at high risk for heart disease.  Cancer screening Lung Cancer  Lung cancer screening is recommended for adults 77-91 years old who are at high risk for lung cancer because of a history of smoking.  A yearly low-dose CT scan of the lungs is recommended for people who: ? Currently smoke. ? Have quit within the past 15 years. ? Have at least a 30-pack-year history of smoking. A pack year is smoking an average of one pack of cigarettes a day for 1 year.  Yearly screening should  continue until it has been 15 years since you quit.  Yearly screening should stop if you develop a health problem that would prevent you from having lung cancer treatment.  Breast Cancer  Practice breast self-awareness. This means understanding how your breasts normally appear and feel.  It also means doing regular breast self-exams. Let your health care provider know about any changes, no matter how small.  If you are in your 20s or 30s, you should have a clinical breast exam (CBE) by a health care provider every 1-3 years as part of a regular health exam.  If you are 37 or older, have a CBE every year. Also consider having a breast X-ray (mammogram) every year.  If you have a family history of breast cancer, talk to your health care provider about genetic screening.  If you are at high risk for breast cancer, talk to your health care provider about having an  MRI and a mammogram every year.  Breast cancer gene (BRCA) assessment is recommended for women who have family members with BRCA-related cancers. BRCA-related cancers include: ? Breast. ? Ovarian. ? Tubal. ? Peritoneal cancers.  Results of the assessment will determine the need for genetic counseling and BRCA1 and BRCA2 testing.  Cervical Cancer Your health care provider may recommend that you be screened regularly for cancer of the pelvic organs (ovaries, uterus, and vagina). This screening involves a pelvic examination, including checking for microscopic changes to the surface of your cervix (Pap test). You may be encouraged to have this screening done every 3 years, beginning at age 11.  For women ages 54-65, health care providers may recommend pelvic exams and Pap testing every 3 years, or they may recommend the Pap and pelvic exam, combined with testing for human papilloma virus (HPV), every 5 years. Some types of HPV increase your risk of cervical cancer. Testing for HPV may also be done on women of any age with unclear Pap test results.  Other health care providers may not recommend any screening for nonpregnant women who are considered low risk for pelvic cancer and who do not have symptoms. Ask your health care provider if a screening pelvic exam is right for you.  If you have had past treatment for cervical cancer or a condition that could lead to cancer, you need Pap tests and screening for cancer for at least 20 years after your treatment. If Pap tests have been discontinued, your risk factors (such as having a new sexual partner) need to be reassessed to determine if screening should resume. Some women have medical problems that increase the chance of getting cervical cancer. In these cases, your health care provider may recommend more frequent screening and Pap tests.  Colorectal Cancer  This type of cancer can be detected and often prevented.  Routine colorectal cancer screening  usually begins at 22 years of age and continues through 22 years of age.  Your health care provider may recommend screening at an earlier age if you have risk factors for colon cancer.  Your health care provider may also recommend using home test kits to check for hidden blood in the stool.  A small camera at the end of a tube can be used to examine your colon directly (sigmoidoscopy or colonoscopy). This is done to check for the earliest forms of colorectal cancer.  Routine screening usually begins at age 39.  Direct examination of the colon should be repeated every 5-10 years through 22 years of age. However, you may  need to be screened more often if early forms of precancerous polyps or small growths are found.  Skin Cancer  Check your skin from head to toe regularly.  Tell your health care provider about any new moles or changes in moles, especially if there is a change in a mole's shape or color.  Also tell your health care provider if you have a mole that is larger than the size of a pencil eraser.  Always use sunscreen. Apply sunscreen liberally and repeatedly throughout the day.  Protect yourself by wearing long sleeves, pants, a wide-brimmed hat, and sunglasses whenever you are outside.  Heart disease, diabetes, and high blood pressure  High blood pressure causes heart disease and increases the risk of stroke. High blood pressure is more likely to develop in: ? People who have blood pressure in the high end of the normal range (130-139/85-89 mm Hg). ? People who are overweight or obese. ? People who are African American.  If you are 42-88 years of age, have your blood pressure checked every 3-5 years. If you are 60 years of age or older, have your blood pressure checked every year. You should have your blood pressure measured twice-once when you are at a hospital or clinic, and once when you are not at a hospital or clinic. Record the average of the two measurements. To check  your blood pressure when you are not at a hospital or clinic, you can use: ? An automated blood pressure machine at a pharmacy. ? A home blood pressure monitor.  If you are between 14 years and 67 years old, ask your health care provider if you should take aspirin to prevent strokes.  Have regular diabetes screenings. This involves taking a blood sample to check your fasting blood sugar level. ? If you are at a normal weight and have a low risk for diabetes, have this test once every three years after 22 years of age. ? If you are overweight and have a high risk for diabetes, consider being tested at a younger age or more often. Preventing infection Hepatitis B  If you have a higher risk for hepatitis B, you should be screened for this virus. You are considered at high risk for hepatitis B if: ? You were born in a country where hepatitis B is common. Ask your health care provider which countries are considered high risk. ? Your parents were born in a high-risk country, and you have not been immunized against hepatitis B (hepatitis B vaccine). ? You have HIV or AIDS. ? You use needles to inject street drugs. ? You live with someone who has hepatitis B. ? You have had sex with someone who has hepatitis B. ? You get hemodialysis treatment. ? You take certain medicines for conditions, including cancer, organ transplantation, and autoimmune conditions.  Hepatitis C  Blood testing is recommended for: ? Everyone born from [birth year] through 1965. ? Anyone with known risk factors for hepatitis C.  Sexually transmitted infections (STIs)  You should be screened for sexually transmitted infections (STIs) including gonorrhea and chlamydia if: ? You are sexually active and are younger than 22 years of age. ? You are older than 21 years of age and your health care provider tells you that you are at risk for this type of infection. ? Your sexual activity has changed since you were last screened and you  are at an increased risk for chlamydia or gonorrhea. Ask your health care provider if you are at  risk.  If you do not have HIV, but are at risk, it may be recommended that you take a prescription medicine daily to prevent HIV infection. This is called pre-exposure prophylaxis (PrEP). You are considered at risk if: ? You are sexually active and do not regularly use condoms or know the HIV status of your partner(s). ? You take drugs by injection. ? You are sexually active with a partner who has HIV.  Talk with your health care provider about whether you are at high risk of being infected with HIV. If you choose to begin PrEP, you should first be tested for HIV. You should then be tested every 3 months for as long as you are taking PrEP. Pregnancy  If you are premenopausal and you may become pregnant, ask your health care provider about preconception counseling.  If you may become pregnant, take 400 to 800 micrograms (mcg) of folic acid every day.  If you want to prevent pregnancy, talk to your health care provider about birth control (contraception). Osteoporosis and menopause  Osteoporosis is a disease in which the bones lose minerals and strength with aging. This can result in serious bone fractures. Your risk for osteoporosis can be identified using a bone density scan.  If you are 65 years of age or older, or if you are at risk for osteoporosis and fractures, ask your health care provider if you should be screened.  Ask your health care provider whether you should take a calcium or vitamin D supplement to lower your risk for osteoporosis.  Menopause may have certain physical symptoms and risks.  Hormone replacement therapy may reduce some of these symptoms and risks. Talk to your health care provider about whether hormone replacement therapy is right for you. Follow these instructions at home:  Schedule regular health, dental, and eye exams.  Stay current with your  immunizations.  Do not use any tobacco products including cigarettes, chewing tobacco, or electronic cigarettes.  If you are pregnant, do not drink alcohol.  If you are breastfeeding, limit how much and how often you drink alcohol.  Limit alcohol intake to no more than 1 drink per day for nonpregnant women. One drink equals 12 ounces of beer, 5 ounces of wine, or 1 ounces of hard liquor.  Do not use street drugs.  Do not share needles.  Ask your health care provider for help if you need support or information about quitting drugs.  Tell your health care provider if you often feel depressed.  Tell your health care provider if you have ever been abused or do not feel safe at home. This information is not intended to replace advice given to you by your health care provider. Make sure you discuss any questions you have with your health care provider. Document Released: 12/26/2010 Document Revised: 11/18/2015 Document Reviewed: 03/16/2015 Elsevier Interactive Patient Education  [employer].

## 2018-05-11 ENCOUNTER — Encounter: Payer: Self-pay | Admitting: Obstetrics and Gynecology

## 2018-06-04 NOTE — Telephone Encounter (Deleted)
error 

## 2018-06-10 ENCOUNTER — Encounter: Payer: Self-pay | Admitting: Family Medicine

## 2018-06-10 ENCOUNTER — Ambulatory Visit: Payer: PRIVATE HEALTH INSURANCE | Admitting: Family Medicine

## 2018-06-10 VITALS — BP 98/62 | HR 108 | Temp 98.5°F | Ht 68.0 in | Wt 114.2 lb

## 2018-06-10 DIAGNOSIS — M2550 Pain in unspecified joint: Secondary | ICD-10-CM

## 2018-06-10 NOTE — Progress Notes (Signed)
Overall rare use of metoprolol, a few times a month, prn.   She had lump on her R5th finger (at the PIP), with possible bone island noted proximally.  Discussed with patient.  The sclerotic changes on the x-ray appear to be an incidental finding and unrelated.  She had transient R hand 5th PIP swelling, then 4th PIP and DIP joint swelling.  Then R2nd toe DIP joint changes.  She had local swelling at the joints without trauma.  She came in to talk about why she was having recurrent small joint symptoms.  There was no obvious overuse or other triggering factor.  She does have a history of Ehlers-Danlos syndrome.  Meds, vitals, and allergies reviewed.   ROS: Per HPI unless specifically indicated in ROS section   nad ncat Right hand with normal sensation and normal capillary refill without overt active/acute synovitis noted. R hand 5th PIP, 4th PIP and DIP joints are still more sensitive to touch than other joints and she has some minimal pink changes of the skin that is slightly darker at these listed joints compared to the skin on her hand otherwise. She has similar changes on the right second toe, at the DIP.

## 2018-06-10 NOTE — Patient Instructions (Signed)
Unclear if your joint pain is related to EDS vs another separate inflammatory condition.   I need rheumatology input.    We make arrangements for referrals, extra imaging, and other appointments based on the urgency of the situation. Referrals are handled based on the clinical situation, not in the order that they are placed. If you do not see one of our referral coordinators on the way out of the clinic today, then you should expect a call in the next 1 to 2 weeks. We work diligently to process all referrals as quickly as possible.    Take care.  Glad to see you.

## 2018-06-11 DIAGNOSIS — M2550 Pain in unspecified joint: Secondary | ICD-10-CM

## 2018-06-11 HISTORY — DX: Pain in unspecified joint: M25.50

## 2018-06-11 NOTE — Assessment & Plan Note (Signed)
Discussed options with patient. Unclear if her joint pain is related to EDS vs another separate inflammatory condition.   I need rheumatology input.  I talked about the work-up.  Since she is already had an x-ray of her hand done we do not need to repeat that.  I want to defer labs until she sees rheumatology.  Patient agrees.

## 2018-06-14 ENCOUNTER — Other Ambulatory Visit: Payer: Self-pay

## 2018-06-14 MED ORDER — METRONIDAZOLE 500 MG PO TABS: 500.0000 mg | ORAL_TABLET | Freq: Two times a day (BID) | ORAL | 0 refills | Status: DC

## 2018-06-26 DIAGNOSIS — M2550 Pain in unspecified joint: Secondary | ICD-10-CM

## 2018-06-26 DIAGNOSIS — I7300 Raynaud's syndrome without gangrene: Secondary | ICD-10-CM

## 2018-06-26 HISTORY — DX: Raynaud's syndrome without gangrene: I73.00

## 2018-06-26 HISTORY — DX: Pain in unspecified joint: M25.50

## 2018-07-01 DIAGNOSIS — I7300 Raynaud's syndrome without gangrene: Secondary | ICD-10-CM | POA: Insufficient documentation

## 2018-07-01 DIAGNOSIS — M2550 Pain in unspecified joint: Secondary | ICD-10-CM | POA: Diagnosis not present

## 2018-08-01 ENCOUNTER — Ambulatory Visit: Payer: BLUE CROSS/BLUE SHIELD | Admitting: Internal Medicine

## 2018-08-01 ENCOUNTER — Encounter: Payer: Self-pay | Admitting: Internal Medicine

## 2018-08-01 VITALS — BP 110/60 | HR 106 | Temp 98.7°F | Ht 68.0 in | Wt 117.0 lb

## 2018-08-01 DIAGNOSIS — J069 Acute upper respiratory infection, unspecified: Secondary | ICD-10-CM | POA: Diagnosis not present

## 2018-08-01 NOTE — Assessment & Plan Note (Signed)
Discussed apparent viral etiology Supportive care If worsens next week, would try empiric amoxicillin for the apparent sinus involvement

## 2018-08-01 NOTE — Progress Notes (Signed)
Subjective:    Patient ID: Monique Underwood, female    DOB: March 02, 1996, 23 y.o.   MRN: [id number]  HPI Here due to respiratory illness  This is the 4th day Runny nose at first--thought it was allergies Then low grade fever in the past couple of days Itchy watery eyes Some head congestion---maxillary sinus pressure No ear pain Some cough--scratchy throat Some chills/sweats No SOB Some muscle aches  Dayquil/nyquil not much help  Current Outpatient Medications on File Prior to Visit  Medication Sig Dispense Refill  . etonogestrel (NEXPLANON) 68 MG IMPL implant 1 each by Subdermal route once.    . hydrocortisone (ANUSOL-HC) 2.5 % rectal cream Place 1 application rectally 2 (two) times daily. 30 g 0  . lidocaine (XYLOCAINE) 2 % jelly Apply 1 application topically as needed. 30 mL 2  . metoprolol tartrate (LOPRESSOR) 25 MG tablet TAKE HALF TO ONE TABLET BY MOUTH 2 (TWO) TIMES DAILY AS NEEDED(FOR ANXIETY/ELEVATED HEART RATE 180 tablet 1  . metroNIDAZOLE (FLAGYL) 500 MG tablet Take 1 tablet (500 mg total) by mouth 2 (two) times daily. 14 tablet 0  . ondansetron (ZOFRAN-ODT) 4 MG disintegrating tablet TAKE 1 TABLET BY MOUTH EVERY 8 HOURS AS NEEDED FOR NAUSEA AND VOMITING 30 tablet 2   No current facility-administered medications on file prior to visit.     Allergies  Allergen Reactions  . Shellfish Allergy Anaphylaxis and Hives  . Lexapro [Escitalopram Oxalate] Other (See Comments)    GI upset.     Past Medical History:  Diagnosis Date  . Anxiety   . EDS (Ehlers-Danlos syndrome)    EDS 3  . History of chlamydia infection 01/04/2017  . Migraine with aura   . Ovarian cyst   . POTS (postural orthostatic tachycardia syndrome)   . Rotavirus infection 1/25 - 07/22/02   Hospitalized  . Syncope     Past Surgical History:  Procedure Laterality Date  . DILATION AND EVACUATION N/A 04/18/2016   Procedure: DILATATION AND EVACUATION;  Surgeon: Hildred Laser, MD;  Location: [facility] ORS;   Service: Gynecology;  Laterality: N/A;  . 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 Febrile seizure  7/99   [facility] - w/u negative  . MRI - head     NML    Family History  Problem Relation Age of Onset  . Thyroid disease Mother   . Ehlers-Danlos syndrome Father   . Cancer Other   . Diabetes Paternal Grandmother   . Breast cancer Neg Hx   . Ovarian cancer Neg Hx   . Colon cancer Neg Hx     Social History   Socioeconomic History  . Marital status: Single    Spouse name: Not on file  . Number of children: Not on file  . Years of education: Not on file  . Highest education level: Not on file  Occupational History  . Not on file  Social Needs  . Financial resource strain: Not on file  . Food insecurity:    Worry: Not on file    Inability: Not on file  . Transportation needs:    Medical: Not on file    Non-medical: Not on file  Tobacco Use  . Smoking status: Current Every Day Smoker    Packs/day: 0.50    Years: 3.00    Pack years: 1.50    Types: E-cigarettes  . Smokeless tobacco: Never Used  Substance and Sexual Activity  . Alcohol use: Yes    Alcohol/week: 0.0  standard drinks    Comment: occass  . Drug use: No  . Sexual activity: Yes    Birth control/protection: Implant    Comment: Nexplanon inserted 02/21/17  Lifestyle  . Physical activity:    Days per week: Not on file    Minutes per session: Not on file  . Stress: Not on file  Relationships  . Social connections:    Talks on phone: Not on file    Gets together: Not on file    Attends religious service: Not on file    Active member of club or organization: Not on file    Attends meetings of clubs or organizations: Not on file    Relationship status: Not on file  . Intimate partner violence:    Fear of current or ex partner: Not on file    Emotionally abused: Not on file    Physically abused: Not on file    Forced sexual activity: Not on file  Other Topics Concern  . Not on file  Social History Narrative   Lives  with boyfriend   Working at [employer]   Review of [employer] allergies when cold weather comes No rash Nausea in AM--- chronic with heart issue No diarrhea or vomiting No smell---so not much appetite     Objective:   Physical Exam  Constitutional: She appears well-developed. No distress.  HENT:  Mild maxillary tenderness Left TM normal Right TM---some fluid but not inflamed Pharynx without injection  Neck: No thyromegaly present.  Respiratory: Effort normal. No respiratory distress. She has no wheezes. She has no rales.  Lymphadenopathy:    She has no cervical adenopathy.  Skin: No rash noted.           Assessment & Plan:

## 2018-09-03 ENCOUNTER — Encounter: Payer: Self-pay | Admitting: Family

## 2018-09-03 ENCOUNTER — Ambulatory Visit: Payer: Self-pay | Admitting: Family Medicine

## 2018-09-03 DIAGNOSIS — M79644 Pain in right finger(s): Secondary | ICD-10-CM | POA: Diagnosis not present

## 2018-09-03 DIAGNOSIS — I7300 Raynaud's syndrome without gangrene: Secondary | ICD-10-CM | POA: Diagnosis not present

## 2018-09-03 NOTE — Telephone Encounter (Signed)
Tell her that I will see about options in the meantime and we'll be in touch.  Thanks.

## 2018-09-03 NOTE — Telephone Encounter (Signed)
Rec'd call from Robin at [facility] stating the pt. is calling with request to be seen today. by Dr. Para March.  Per Zella Ball, the pt. did not want to state the problem; reported it was personal, and the pt. had some anxiety.  Before the call could be transferred to Nurse Triage at the [facility], the pt. disconnected the call.    Attempted to call pt. on her mobile #  [phone]; left vm to call back to office, and ask for Triage nurse.   Call placed to pt's home; spoke with pt's mother (on [place])  Reported that the patient is requesting to be referred to another Specialist for the "EDS-3 with POTS.  Stated the pt. went to an appt. that was arranged by this office, to see a specialist affiliated with [facility]; felt the specialist was not able to help her.  Is requesting to be sent to a different specialist that knows more about the EDS-3.  Pt's mother advised to try to call pt., again on her mobile #.    Attempted to call pt. At 3:50 on Mobile #; left vm that nurse had spoken with her mother, and to call office if she would like to discuss her concerns further.    Will send note to Dr. Para March, to make him aware of the above.

## 2018-09-03 NOTE — Telephone Encounter (Signed)
Pt notified as instructed and pt is appreciative of all efforts by Dr Para March. Pt will wait to hear back. FYI to Dr Para March.

## 2018-09-03 NOTE — Telephone Encounter (Signed)
I spoke with pt; pt has high anxiety; pt has been referred from doctor to doctor to doctor. Pt wants referral to a doctor that knows about EDS 3 with POTSand follow anxiety issue as well. pts anxiety level is high;pt cannot afford a psychiatrist. Pt request a cb. Pt is not SI/HI per pt. Please advise.

## 2018-09-09 NOTE — Telephone Encounter (Signed)
I've been thinking about this.  The best option I can think of is referral to either [facility] or [facility].  They may be able to offer what she is looking for, but I don't know if it would be just 1 doc involved.  It would be a place to start.  Let me know if she wants me to put in a referral. Thanks.

## 2018-09-09 NOTE — Telephone Encounter (Signed)
Patient advised and will call back if she wants the referral.

## 2018-12-28 IMAGING — CR DG ABDOMEN 1V
1 series · 1 of 1 positions shown · non-contrast
Comparison: Ultrasound 12/26/2017

CLINICAL DATA: Evaluate for kidney stones

EXAM:
ABDOMEN - 1 VIEW

[abdomen kub]
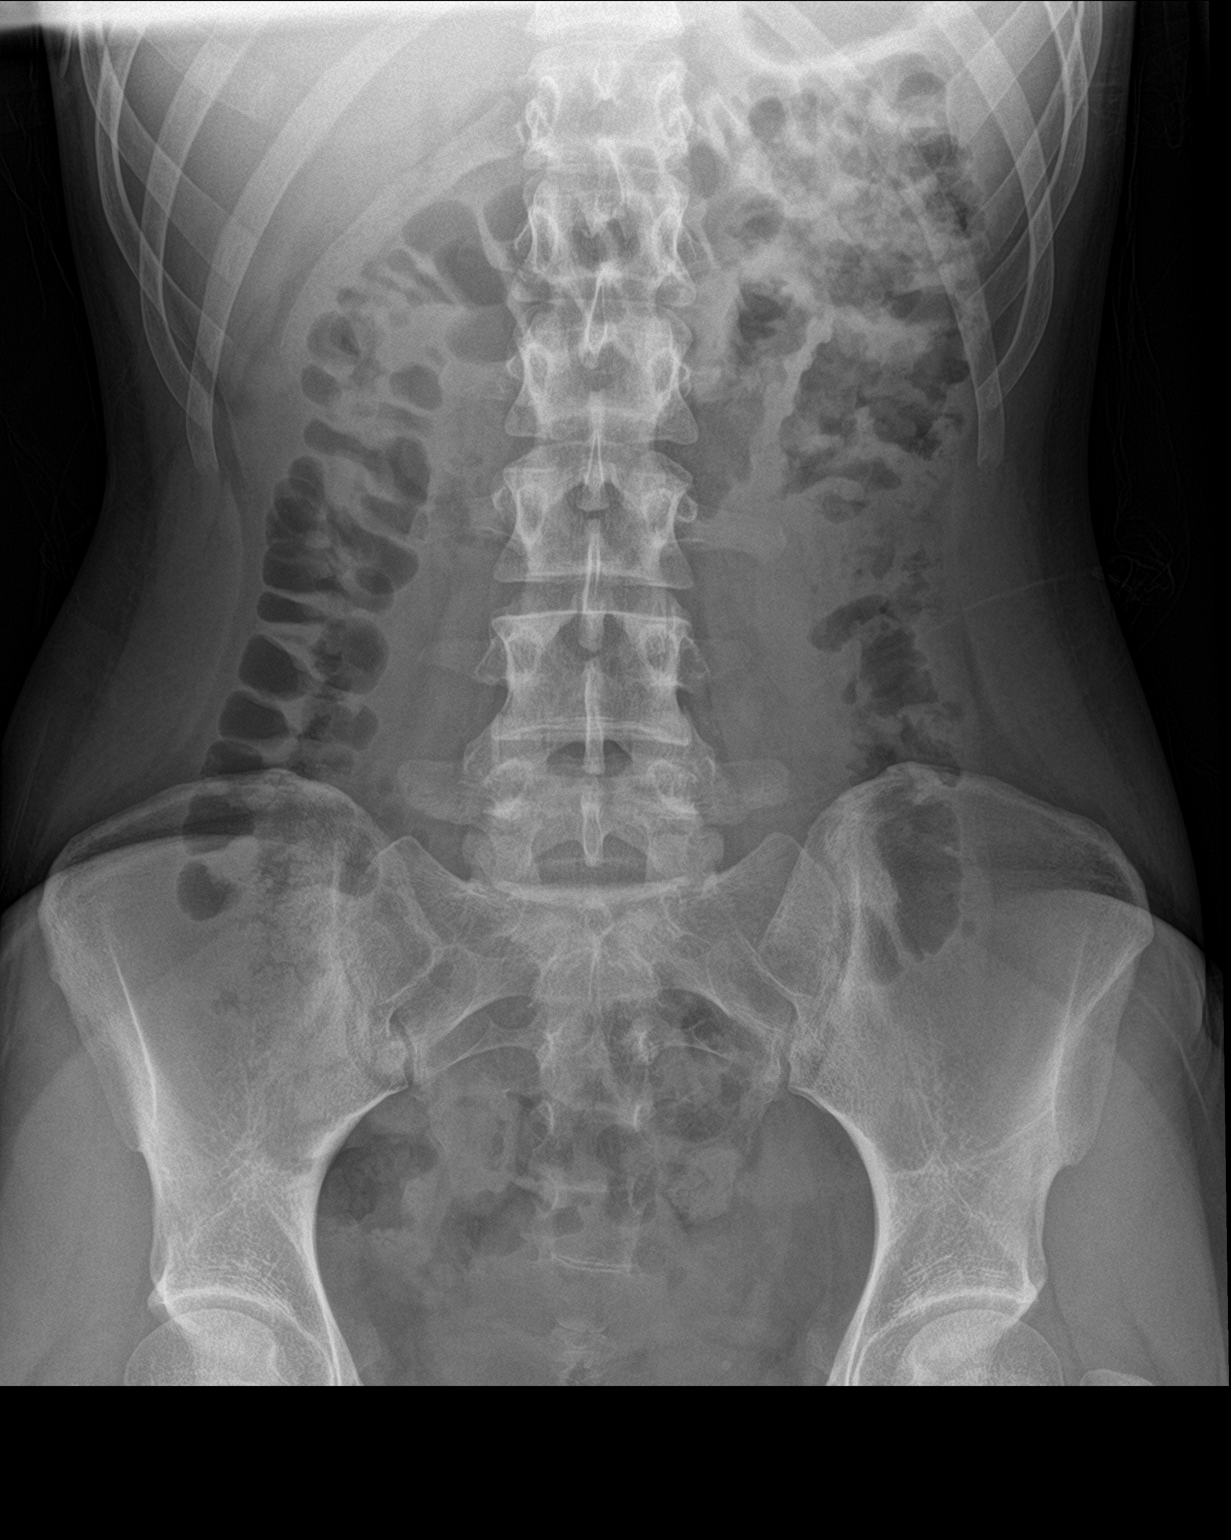

[1 of 1 positions shown; findings below may reference images not displayed]

FINDINGS: The bowel gas pattern is normal. No radio-opaque calculi or other
significant radiographic abnormality are seen.
IMPRESSION: Negative.

## 2019-01-16 ENCOUNTER — Other Ambulatory Visit: Payer: Self-pay

## 2019-01-20 NOTE — Telephone Encounter (Signed)
The patient called and stated that she needs to speak with her nurse in regards to 2 messages she sent via MyChart that she has not gotten a response to. Please see below. Thank you.

## 2019-01-21 MED ORDER — METRONIDAZOLE 500 MG PO TABS: 500.0000 mg | ORAL_TABLET | Freq: Two times a day (BID) | ORAL | 0 refills | Status: DC

## 2019-02-19 ENCOUNTER — Telehealth: Payer: Self-pay | Admitting: Obstetrics and Gynecology

## 2019-02-19 NOTE — Telephone Encounter (Signed)
Patient called stating she had her wisdom teeth removed and has been on antibiotics. She has developed a yeast infection and would like something called in if possible. [facility] [address] in [place]. Thanks

## 2019-02-20 MED ORDER — FLUCONAZOLE 150 MG PO TABS: 150.0000 mg | ORAL_TABLET | Freq: Once | ORAL | 0 refills | Status: AC

## 2019-02-20 NOTE — Telephone Encounter (Signed)
Pt called and informed that Diflucan was sent to her pharmacy.

## 2019-03-10 IMAGING — US US ABDOMEN LIMITED
1 series · 14 of 25 positions shown · non-contrast
Comparison: None.

CLINICAL DATA: Upper abdominal pain with nausea and vomiting

EXAM:
ULTRASOUND ABDOMEN LIMITED RIGHT UPPER QUADRANT

[Series 1: us abdomen limited · 0.14mm/px · 14 of 48 slices shown]
[im 1/48]
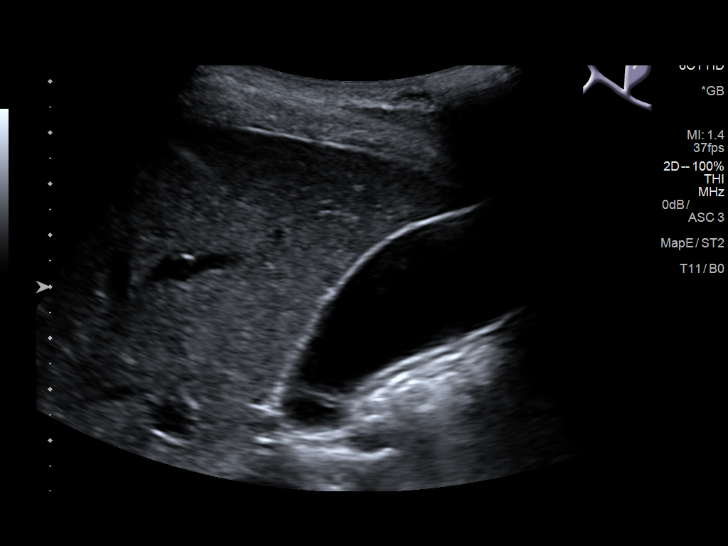
[im 4/48]
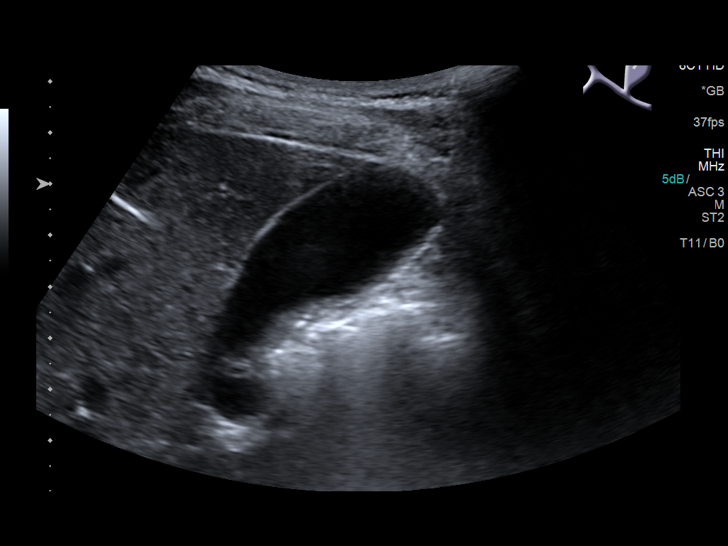
[im 8/48]
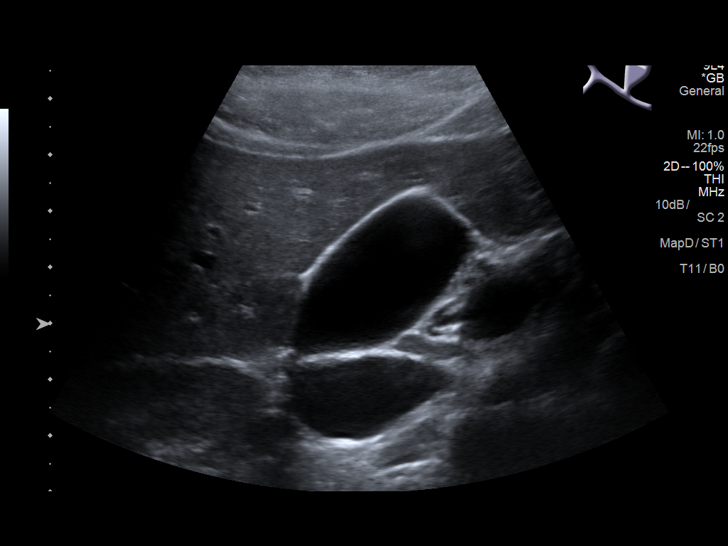
[im 12/48]
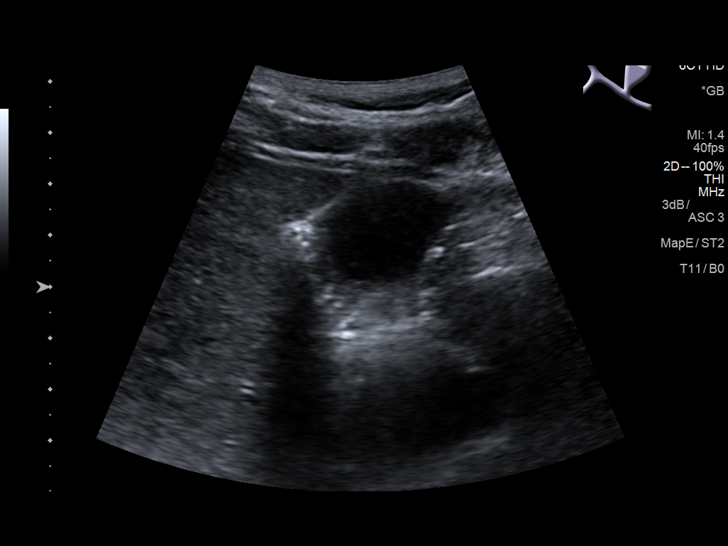
[im 16/48]
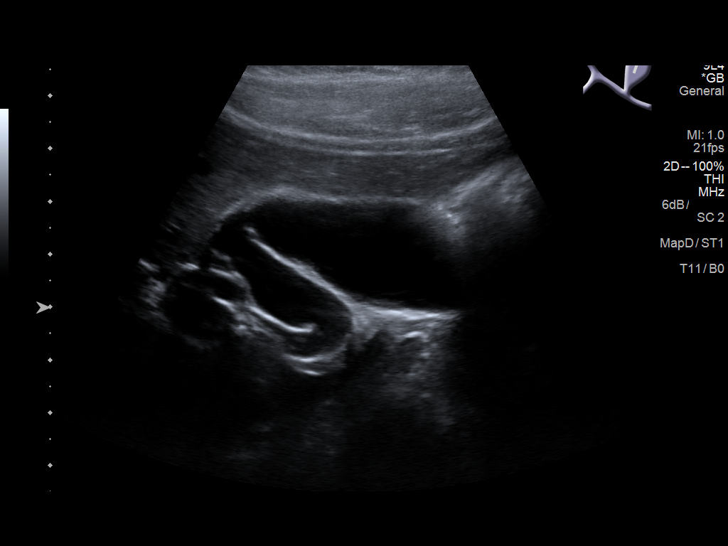
[im 18/48]
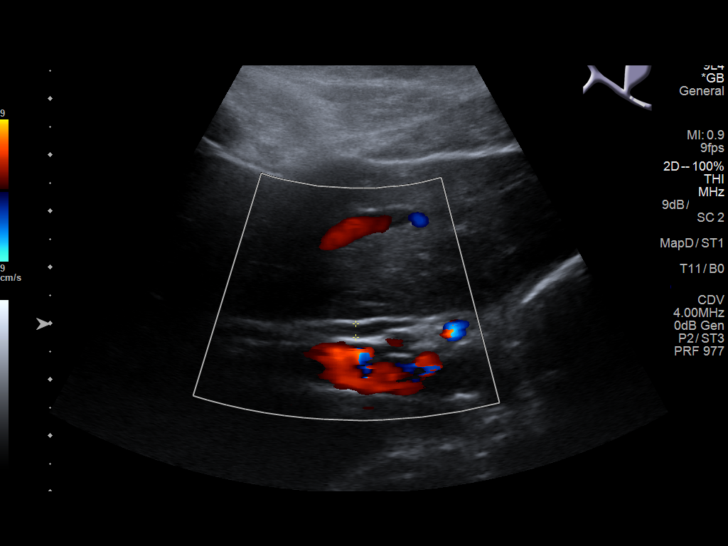
[im 22/48]
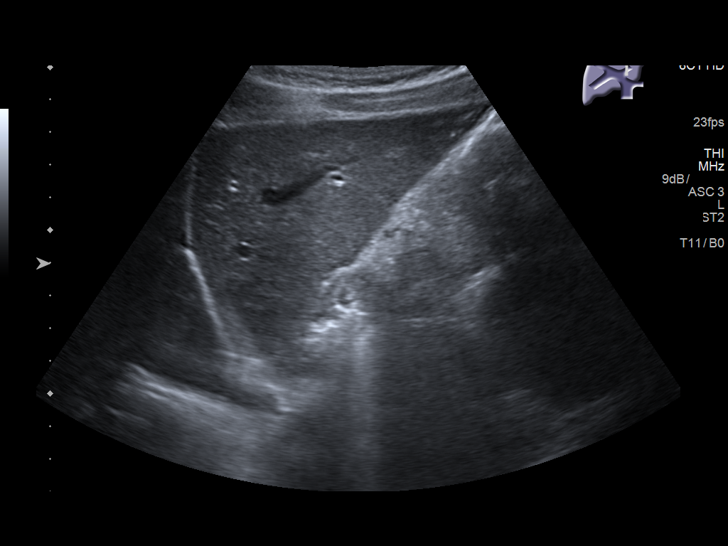
[im 26/48]
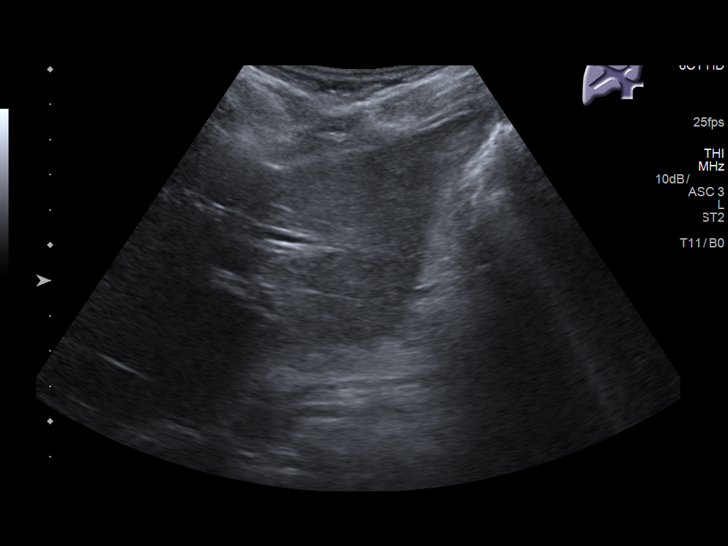
[im 30/48]
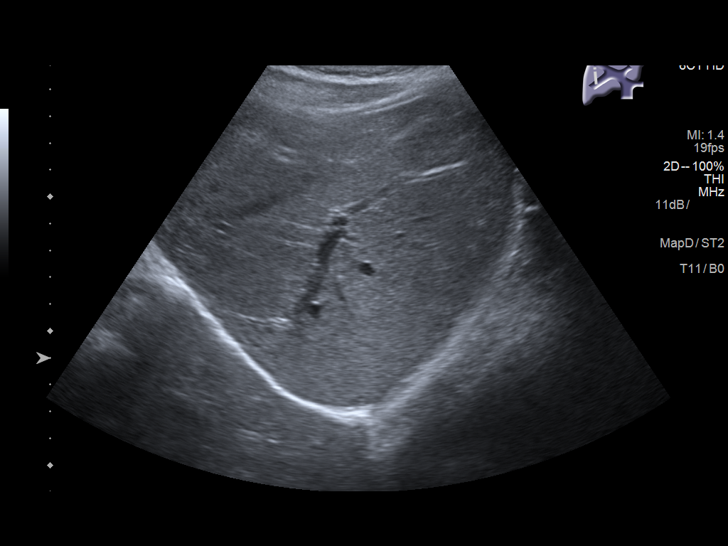
[im 32/48]
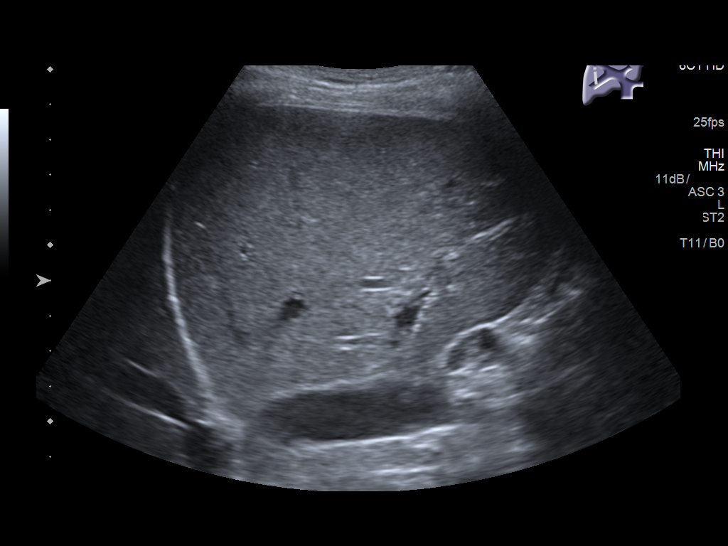
[im 36/48]
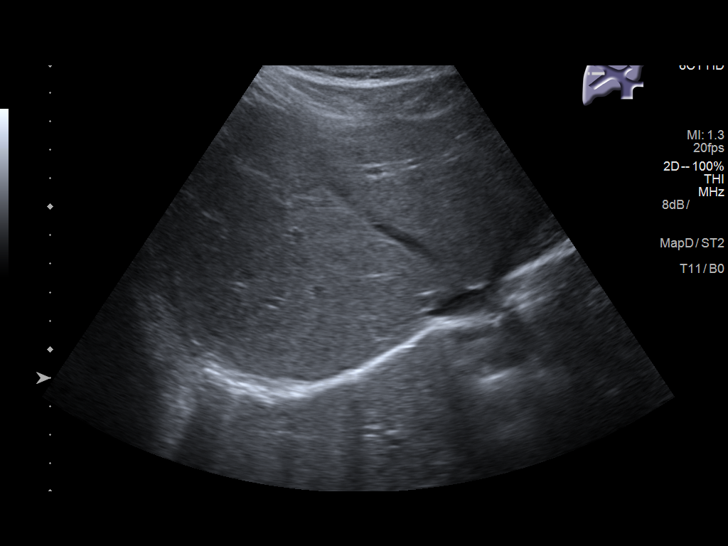
[im 40/48]
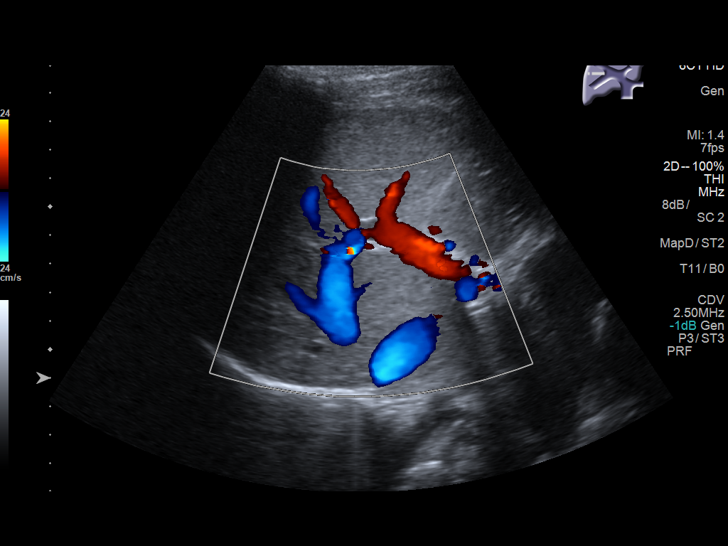
[im 44/48]
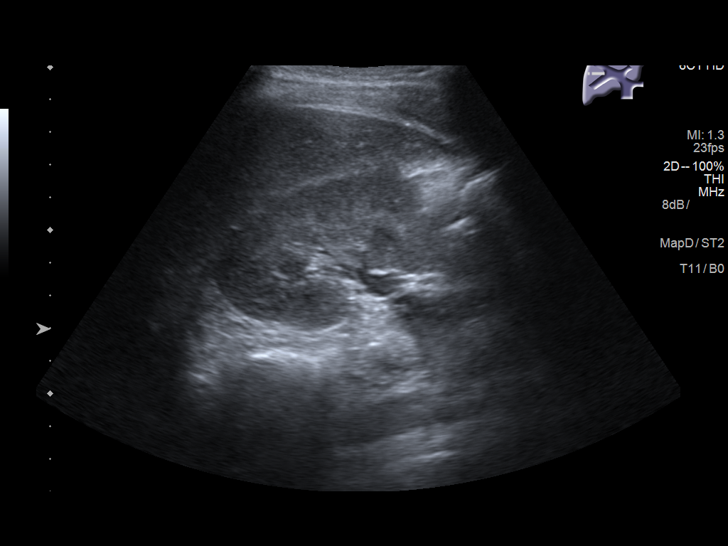
[im 48/48]
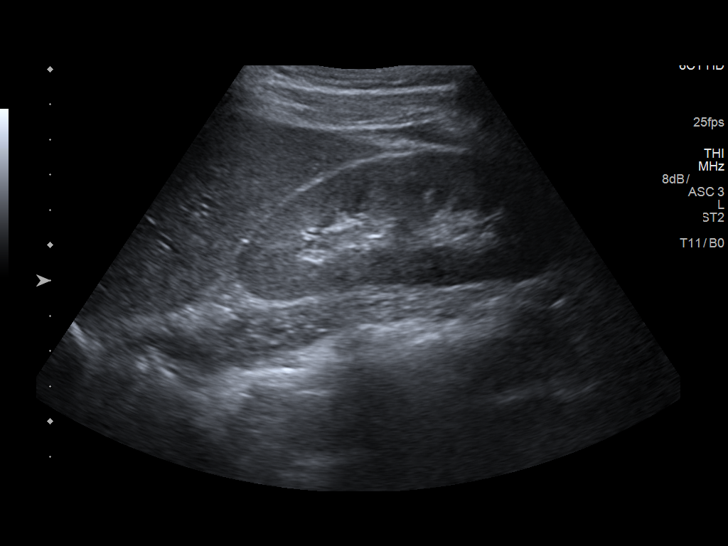

[14 of 25 positions shown; findings below may reference images not displayed]

FINDINGS: Gallbladder:

No gallstones or wall thickening visualized. There is no
pericholecystic fluid. No sonographic Murphy sign noted by
sonographer.

Common bile duct:

Diameter: 4 mm. No intrahepatic or extrahepatic biliary duct
dilatation.

Liver:

No focal lesion identified. Within normal limits in parenchymal
echogenicity. Portal vein is patent on color Doppler imaging with
normal direction of blood flow towards the liver.
IMPRESSION: Study within normal limits.

## 2019-05-12 ENCOUNTER — Encounter: Payer: PRIVATE HEALTH INSURANCE | Admitting: Obstetrics and Gynecology

## 2019-05-14 ENCOUNTER — Other Ambulatory Visit: Payer: Self-pay

## 2019-05-14 ENCOUNTER — Ambulatory Visit (INDEPENDENT_AMBULATORY_CARE_PROVIDER_SITE_OTHER): Payer: BC Managed Care – PPO | Admitting: Obstetrics and Gynecology

## 2019-05-14 ENCOUNTER — Encounter: Payer: Self-pay | Admitting: Obstetrics and Gynecology

## 2019-05-14 ENCOUNTER — Other Ambulatory Visit (HOSPITAL_COMMUNITY)
Admission: RE | Admit: 2019-05-14 | Discharge: 2019-05-14 | Disposition: A | Discharge status: Home / Self Care | Payer: BC Managed Care – PPO | Source: Ambulatory Visit | Attending: Obstetrics and Gynecology | Admitting: Obstetrics and Gynecology

## 2019-05-14 VITALS — BP 108/74 | HR 103 | Ht 68.0 in | Wt 125.2 lb

## 2019-05-14 DIAGNOSIS — Z01419 Encounter for gynecological examination (general) (routine) without abnormal findings: Secondary | ICD-10-CM | POA: Diagnosis not present

## 2019-05-14 DIAGNOSIS — K644 Residual hemorrhoidal skin tags: Secondary | ICD-10-CM | POA: Diagnosis not present

## 2019-05-14 DIAGNOSIS — Z113 Encounter for screening for infections with a predominantly sexual mode of transmission: Secondary | ICD-10-CM

## 2019-05-14 NOTE — Progress Notes (Signed)
GYNECOLOGY ANNUAL PHYSICAL EXAM PROGRESS NOTE  Subjective:    Monique Underwood is a 23 y.o. [id number] female who presents for an annual exam. The patient is sexually active.  The patient wears seatbelts: yes. The patient participates in regular exercise: no. Has the patient ever been transfused or tattooed?: yes (professionally done). The patient reports that there is not domestic violence in her life.   The patient has the following complaints today:  1. Patient reports that she thinks she has developed a skin tag on her perineum.   Gynecologic History  Menarche age: [age] Patient's last menstrual period was 04/19/2019.  Sometimes irregular due to contraception.  Contraception: Nexplanon (inserted 02/21/2017).  History of STI's: H/o chlamydia 12/2016, treated Last Pap: 05/2017.  Results were normal.    OB History  Gravida Para Term Preterm AB Living  1 0 0 0 1 0  SAB TAB Ectopic Multiple Live Births  1 0 0 0 0    # Outcome Date GA Lbr Len/2nd Weight Sex Delivery Anes PTL Lv  1 SAB  [id number]            Birth Comments: D&C    Past Medical History:  Diagnosis Date  . Anxiety   . EDS (Ehlers-Danlos syndrome)    EDS 3  . History of chlamydia infection 01/04/2017  . Migraine with aura   . Ovarian cyst   . POTS (postural orthostatic tachycardia syndrome)   . Rotavirus infection 1/25 - 07/22/02   Hospitalized  . Syncope     Past Surgical History:  Procedure Laterality Date  . DILATION AND EVACUATION N/A 04/18/2016   Procedure: DILATATION AND EVACUATION;  Surgeon: Hildred LaserAnika Zela Sobieski, MD;  Location: [facility] ORS;  Service: Gynecology;  Laterality: N/A;  . 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 Febrile seizure  7/99   [facility] - w/u negative  . MRI - head     NML    Family History  Problem Relation Age of Onset  . Thyroid disease Mother   . Ehlers-Danlos syndrome Father   . Cancer Other   . Diabetes Paternal Grandmother   . Breast cancer Neg Hx   . Ovarian cancer Neg Hx   . Colon cancer Neg Hx      Social History   Socioeconomic History  . Marital status: Single    Spouse name: Not on file  . Number of children: Not on file  . Years of education: Not on file  . Highest education level: Not on file  Occupational History  . Not on file  Social Needs  . Financial resource strain: Not on file  . Food insecurity    Worry: Not on file    Inability: Not on file  . Transportation needs    Medical: Not on file    Non-medical: Not on file  Tobacco Use  . Smoking status: Current Every Day Smoker    Packs/day: 0.50    Years: 3.00    Pack years: 1.50    Types: E-cigarettes  . Smokeless tobacco: Never Used  Substance and Sexual Activity  . Alcohol use: Yes    Alcohol/week: 0.0 standard drinks    Comment: occass  . Drug use: No  . Sexual activity: Yes    Birth control/protection: Implant    Comment: Nexplanon inserted 02/21/17  Lifestyle  . Physical activity    Days per week: Not on file    Minutes per session: Not on file  . Stress:  Not on file  Relationships  . Social Musician on phone: Not on file    Gets together: Not on file    Attends religious service: Not on file    Active member of club or organization: Not on file    Attends meetings of clubs or organizations: Not on file    Relationship status: Not on file  . Intimate partner violence    Fear of current or ex partner: Not on file    Emotionally abused: Not on file    Physically abused: Not on file    Forced sexual activity: Not on file  Other Topics Concern  . Not on file  Social History Narrative   Lives with boyfriend   Working at [employer]    Current Outpatient Medications on File Prior to Visit  Medication Sig Dispense Refill  . etonogestrel (NEXPLANON) 68 MG IMPL implant 1 each by Subdermal route once.    . hydrocortisone (ANUSOL-HC) 2.5 % rectal cream Place 1 application rectally 2 (two) times daily. 30 g 0  . lidocaine (XYLOCAINE) 2 % jelly Apply 1 application topically as needed. 30  mL 2  . metoprolol tartrate (LOPRESSOR) 25 MG tablet TAKE HALF TO ONE TABLET BY MOUTH 2 (TWO) TIMES DAILY AS NEEDED(FOR ANXIETY/ELEVATED HEART RATE 180 tablet 1  . ondansetron (ZOFRAN-ODT) 4 MG disintegrating tablet TAKE 1 TABLET BY MOUTH EVERY 8 HOURS AS NEEDED FOR NAUSEA AND VOMITING 30 tablet 2   No current facility-administered medications on file prior to visit.     Allergies  Allergen Reactions  . Shellfish Allergy Anaphylaxis and Hives  . Lexapro [Escitalopram Oxalate] Other (See Comments)    GI upset.      Review of Systems Constitutional: negative for chills, fatigue, fevers and sweats Eyes: negative for irritation, redness and visual disturbance Ears, nose, mouth, throat, and face: negative for hearing loss, nasal congestion, snoring and tinnitus Respiratory: negative for asthma, cough, sputum Cardiovascular: negative for chest pain, dyspnea, exertional chest pressure/discomfort, irregular heart beat, palpitations and syncope Gastrointestinal: negative for abdominal pain, change in bowel habits, nausea and vomiting Genitourinary: negative for abnormal menstrual periods, genital lesions, sexual problems and vaginal discharge, dysuria and urinary incontinence Integument/breast: negative for breast lump, breast tenderness and nipple discharge Hematologic/lymphatic: negative for bleeding and easy bruising Musculoskeletal:negative for back pain and muscle weakness Neurological: negative for dizziness, headaches, vertigo and weakness Endocrine: negative for diabetic symptoms including polydipsia, polyuria and skin dryness Allergic/Immunologic: negative for hay fever and urticaria        Objective:  Blood pressure 108/74, pulse (!) 103, height 5\' 8"  (1.727 m), weight 125 lb 3.2 oz (56.8 kg), last menstrual period 04/19/2019. Body mass index is 19.04 kg/m.  General Appearance:    Alert, cooperative, no distress, appears stated age  Head:    Normocephalic, without obvious  abnormality, atraumatic  Eyes:    PERRL, conjunctiva/corneas clear, EOM's intact, both eyes  Ears:    Normal external ear canals, both ears  Nose:   Nares normal, septum midline, mucosa normal, no drainage or sinus tenderness  Throat:   Lips, mucosa, and tongue normal; teeth and gums normal  Neck:   Supple, symmetrical, trachea midline, no adenopathy; thyroid: no enlargement/tenderness/nodules; no carotid bruit or JVD  Back:     Symmetric, no curvature, ROM normal, no CVA tenderness  Lungs:     Clear to auscultation bilaterally, respirations unlabored  Chest Wall:    No tenderness or deformity   Heart:  Regular rate and rhythm, S1 and S2 normal, no murmur, rub or gallop  Breast Exam:    No tenderness, masses, or nipple abnormality. Bilateral nipple piercings.   Abdomen:     Soft, non-tender, bowel sounds active all four quadrants, no masses, no organomegaly.    Genitalia:    Pelvic:external genitalia normal, vagina without lesions, discharge, or tenderness, rectovaginal septum  normal. Cervix normal in appearance, no cervical motion tenderness, no adnexal masses or tenderness.  Uterus normal size, shape, mobile, regular contours, nontender.  Rectal:    Normal external sphincter.  Small skin tag vs wart noted at 12 o'clock on perineum just above anus. Internal exam not done.   Extremities:   Extremities normal, atraumatic, no cyanosis or edema  Pulses:   2+ and symmetric all extremities  Skin:   Skin color, texture, turgor normal, no rashes or lesions  Lymph nodes:   Cervical, supraclavicular, and axillary nodes normal  Neurologic:   CNII-XII intact, normal strength, sensation and reflexes throughout   .  Labs:  Reviewed today.    Assessment:    Healthy female exam.   Skin tag  Nexplanon in place  Plan:     Blood tests: up to date. Reviewed in Care Everywhere Breast self exam technique reviewed and patient encouraged to perform self-exam monthly. Contraception: Nexplanon. Due for  removal 01/2020. Will schedule Discussed healthy lifestyle modifications.  Pap smear up to date.  Next due in 2021. Ok with STI screening. Still with same partner.  Declines flu vaccine.  Skin tag removal done today at patient's request.  Follow up in 1 year for annual exam.      Rubie Maid, MD [facility]

## 2019-05-14 NOTE — Progress Notes (Signed)
Pt is present for annual exam. Pt stated that she was doing well and denies any gyn issues at this time. Pt declined flu vaccine.  Pt's last pap 04/27/17 results normal. LMP 04/19/19.

## 2019-05-14 NOTE — Patient Instructions (Signed)
Pt Preventive Care 56-23 Years Old, Female Preventive care refers to visits with your health care provider and lifestyle choices that can promote health and wellness. This includes:  A yearly physical exam. This may also be called an annual well check.  Regular dental visits and eye exams.  Immunizations.  Screening for certain conditions.  Healthy lifestyle choices, such as eating a healthy diet, getting regular exercise, not using drugs or products that contain nicotine and tobacco, and limiting alcohol use. What can I expect for my preventive care visit? Physical exam Your health care provider will check your:  Height and weight. This may be used to calculate body mass index (BMI), which tells if you are at a healthy weight.  Heart rate and blood pressure.  Skin for abnormal spots. Counseling Your health care provider may ask you questions about your:  Alcohol, tobacco, and drug use.  Emotional well-being.  Home and relationship well-being.  Sexual activity.  Eating habits.  Work and work Statistician.  Method of birth control.  Menstrual cycle.  Pregnancy history. What immunizations do I need?  Influenza (flu) vaccine  This is recommended every year. Tetanus, diphtheria, and pertussis (Tdap) vaccine  You may need a Td booster every 10 years. Varicella (chickenpox) vaccine  You may need this if you have not been vaccinated. Human papillomavirus (HPV) vaccine  If recommended by your health care provider, you may need three doses over 6 months. Measles, mumps, and rubella (MMR) vaccine  You may need at least one dose of MMR. You may also need a second dose. Meningococcal conjugate (MenACWY) vaccine  One dose is recommended if you are age 22-21 years and a first-year college student living in a residence hall, or if you have one of several medical conditions. You may also need additional booster doses. Pneumococcal conjugate (PCV13) vaccine  You may need  this if you have certain conditions and were not previously vaccinated. Pneumococcal polysaccharide (PPSV23) vaccine  You may need one or two doses if you smoke cigarettes or if you have certain conditions. Hepatitis A vaccine  You may need this if you have certain conditions or if you travel or work in places where you may be exposed to hepatitis A. Hepatitis B vaccine  You may need this if you have certain conditions or if you travel or work in places where you may be exposed to hepatitis B. Haemophilus influenzae type b (Hib) vaccine  You may need this if you have certain conditions. You may receive vaccines as individual doses or as more than one vaccine together in one shot (combination vaccines). Talk with your health care provider about the risks and benefits of combination vaccines. What tests do I need?  Blood tests  Lipid and cholesterol levels. These may be checked every 5 years starting at age 97.  Hepatitis C test.  Hepatitis B test. Screening  Diabetes screening. This is done by checking your blood sugar (glucose) after you have not eaten for a while (fasting).  Sexually transmitted disease (STD) testing.  BRCA-related cancer screening. This may be done if you have a family history of breast, ovarian, tubal, or peritoneal cancers.  Pelvic exam and Pap test. This may be done every 3 years starting at age 30. Starting at age 58, this may be done every 5 years if you have a Pap test in combination with an HPV test. Talk with your health care provider about your test results, treatment options, and if necessary, the need for more  tests. Follow these instructions at home: Eating and drinking   Eat a diet that includes fresh fruits and vegetables, whole grains, lean protein, and low-fat dairy.  Take vitamin and mineral supplements as recommended by your health care provider.  Do not drink alcohol if: ? Your health care provider tells you not to drink. ? You are  pregnant, may be pregnant, or are planning to become pregnant.  If you drink alcohol: ? Limit how much you have to 0-1 drink a day. ? Be aware of how much alcohol is in your drink. In the [place] one drink equals one 12 oz bottle of beer (355 mL), one 5 oz glass of wine (148 mL), or one 1 oz glass of hard liquor (44 mL). Lifestyle  Take daily care of your teeth and gums.  Stay active. Exercise for at least 30 minutes on 5 or more days each week.  Do not use any products that contain nicotine or tobacco, such as cigarettes, e-cigarettes, and chewing tobacco. If you need help quitting, ask your health care provider.  If you are sexually active, practice safe sex. Use a condom or other form of birth control (contraception) in order to prevent pregnancy and STIs (sexually transmitted infections). If you plan to become pregnant, see your health care provider for a preconception visit. What's next?  Visit your health care provider once a year for a well check visit.  Ask your health care provider how often you should have your eyes and teeth checked.  Stay up to date on all vaccines. This information is not intended to replace advice given to you by your health care provider. Make sure you discuss any questions you have with your health care provider. Document Released: 08/08/2001 Document Revised: 02/21/2018 Document Reviewed: 02/21/2018 Elsevier Patient Education  2020 [place] self-awareness is knowing how your breasts look and feel. Doing breast self-awareness is important. It allows you to catch a breast problem early while it is still small and can be treated. All women should do breast self-awareness, including women who have had breast implants. Tell your doctor if you notice a change in your breasts. What you need:  A mirror.  A well-lit room. How to do a breast self-exam A breast self-exam is one way to learn what is normal for your breasts and to  check for changes. To do a breast self-exam: Look for changes  1. Take off all the clothes above your waist. 2. Stand in front of a mirror in a room with good lighting. 3. Put your hands on your hips. 4. Push your hands down. 5. Look at your breasts and nipples in the mirror to see if one breast or nipple looks different from the other. Check to see if: ? The shape of one breast is different. ? The size of one breast is different. ? There are wrinkles, dips, and bumps in one breast and not the other. 6. Look at each breast for changes in the skin, such as: ? Redness. ? Scaly areas. 7. Look for changes in your nipples, such as: ? Liquid around the nipples. ? Bleeding. ? Dimpling. ? Redness. ? A change in where the nipples are. Feel for changes  1. Lie on your back on the floor. 2. Feel each breast. To do this, follow these steps: ? Pick a breast to feel. ? Put the arm closest to that breast above your head. ? Use your other arm to feel the nipple area  your breast. Feel the area with the pads of your three middle fingers by making small circles with your fingers. For the first circle, press lightly. For the second circle, press harder. For the third circle, press even harder. °? Keep making circles with your fingers at the different pressures as you move down your breast. Stop when you feel your ribs. °? Move your fingers a little toward the center of your body. °? Start making circles with your fingers again, this time going up until you reach your collarbone. °? Keep making up-and-down circles until you reach your armpit. Remember to keep using the three pressures. °? Feel the other breast in the same way. °3. Sit or stand in the tub or shower. °4. With soapy water on your skin, feel each breast the same way you did in step 2 when you were lying on the floor. °Write down what you find °Writing down what you find can help you remember what to tell your doctor. Write down: °· What is  normal for each breast. °· Any changes you find in each breast, including: °? The kind of changes you find. °? Whether you have pain. °? Size and location of any lumps. °· When you last had your menstrual period. °General tips °· Check your breasts every month. °· If you are breastfeeding, the best time to check your breasts is after you feed your baby or after you use a breast pump. °· If you get menstrual periods, the best time to check your breasts is 5-7 days after your menstrual period is over. °· With time, you will become comfortable with the self-exam, and you will begin to know if there are changes in your breasts. °Contact a doctor if you: °· See a change in the shape or size of your breasts or nipples. °· See a change in the skin of your breast or nipples, such as red or scaly skin. °· Have fluid coming from your nipples that is not normal. °· Find a lump or thick area that was not there before. °· Have pain in your breasts. °· Have any concerns about your breast health. °Summary °· Breast self-awareness includes looking for changes in your breasts, as well as feeling for changes within your breasts. °· Breast self-awareness should be done in front of a mirror in a well-lit room. °· You should check your breasts every month. If you get menstrual periods, the best time to check your breasts is 5-7 days after your menstrual period is over. °· Let your doctor know of any changes you see in your breasts, including changes in size, changes on the skin, pain or tenderness, or fluid from your nipples that is not normal. °This information is not intended to replace advice given to you by your health care provider. Make sure you discuss any questions you have with your health care provider. °Document Released: 11/29/2007 Document Revised: 01/29/2018 Document Reviewed: 01/29/2018 °Elsevier Patient Education © 2020 Elsevier Inc. ° °

## 2019-05-16 LAB — CERVICOVAGINAL ANCILLARY ONLY
Chlamydia: NEGATIVE
Comment: NEGATIVE
Comment: NORMAL
Neisseria Gonorrhea: NEGATIVE

## 2019-06-02 ENCOUNTER — Telehealth: Payer: Self-pay

## 2019-06-02 ENCOUNTER — Other Ambulatory Visit: Payer: Self-pay

## 2019-06-02 MED ORDER — METRONIDAZOLE 500 MG PO TABS
500.0000 mg | ORAL_TABLET | Freq: Two times a day (BID) | ORAL | 0 refills | Status: DC
Start: 2019-06-02 — End: 2019-08-05

## 2019-06-02 NOTE — Telephone Encounter (Signed)
Please see mychart encounter

## 2019-06-02 NOTE — Telephone Encounter (Signed)
Pt called checking status of her request for refill BV medication.

## 2019-06-27 DIAGNOSIS — Q796 Ehlers-Danlos syndrome, unspecified: Secondary | ICD-10-CM

## 2019-06-27 DIAGNOSIS — G90A Postural orthostatic tachycardia syndrome (POTS): Secondary | ICD-10-CM

## 2019-06-27 HISTORY — DX: Postural orthostatic tachycardia syndrome (POTS): G90.A

## 2019-06-27 HISTORY — DX: Ehlers-Danlos syndrome, unspecified: Q79.60

## 2019-08-05 ENCOUNTER — Encounter: Payer: Self-pay | Admitting: Obstetrics and Gynecology

## 2019-08-05 ENCOUNTER — Other Ambulatory Visit: Payer: Self-pay

## 2019-08-05 ENCOUNTER — Ambulatory Visit: Payer: BC Managed Care – PPO | Admitting: Obstetrics and Gynecology

## 2019-08-05 VITALS — BP 115/70 | HR 103 | Ht 68.0 in | Wt 121.8 lb

## 2019-08-05 DIAGNOSIS — L731 Pseudofolliculitis barbae: Secondary | ICD-10-CM

## 2019-08-05 NOTE — Progress Notes (Signed)
Pt present for ingrown hair that has been present for about a month that is painful to the touch. Pt has tried to pop the area several times with any relief.

## 2019-08-05 NOTE — Progress Notes (Signed)
    GYNECOLOGY PROGRESS NOTE  Subjective:    Patient ID: Monique Underwood, female    DOB: January 05, 1996, 24 y.o.   MRN: [id number]  HPI  Patient is a 24 y.o. [id number] female who presents for complaints of an ingrown hair of the vulva that has been bothering her for at least the past 2 weeks.  Notes she has not tried to treat with anything except use of topical numbing gel which has provided minimal relief.  Wonders if this is normal as it has persisted. Has tried to rupture the area without success several times.   The following portions of the patient's history were reviewed and updated as appropriate: allergies, current medications, past family history, past medical history, past social history, past surgical history and problem list.  Review of Systems Pertinent items noted in HPI and remainder of comprehensive ROS otherwise negative.   Objective:   Blood pressure 115/70, pulse (!) 103, height 5\' 8"  (1.727 m), weight 121 lb 12.8 oz (55.2 kg), last menstrual period 07/13/2019. General appearance: alert and no distress Abdomen: soft, non-tender; bowel sounds normal; no masses,  no organomegaly Pelvic: external genitalia with 1 x 1.5 cm raised area on right vulva, fluctuant, non-indurated, no erythema.   Assessment:   Ingrown hair  Plan:   Discussed with patient that area not quite at the point of requiring an I&D procedure, however offered attempts to drain with needle aspiration vs expectant management with use of sitz baths or warm compresses, tea-tree oil/witch hazel topically, and pain management with NSAIDs/Tylenol/local anesthetic.  Patient ok to attempt needle aspiration.  Area injected with 2.5 ml of 1% lidocaine and attempted to withdraw fluid. Scant fluid noted, patient was unable to tolerate procedure due to discomfort. Will manage expectantly    07/15/2019, MD [facility]

## 2019-08-05 NOTE — Patient Instructions (Signed)
Ingrown Hair  An ingrown hair is a hair that curls and re-enters the skin instead of growing straight out of the skin. An ingrown hair can develop in any part of the skin that hair is removed from. An ingrown hair may cause small pockets of infection. What are the causes? An ingrown hair may be caused by:  Shaving.  Tweezing.  Waxing.  Using a hair removal cream. What increases the risk? You are more likely to develop this condition if you have curly hair. What are the signs or symptoms? Symptoms of an ingrown hair may include:  Small bumps on the skin. The bumps may be filled with pus.  Pain.  Itching. How is this diagnosed? An ingrown hair is diagnosed by a skin exam. How is this treated? Treatment is often not needed unless the ingrown hair has caused an infection. If needed, treatment may include:  Applying prescription creams to the skin. This can help with inflammation.  Applying warm compresses to the skin. This can help soften the skin.  Taking antibiotic medicine. An antibiotic may be prescribed if the infection is severe.  Retracting and releasing the ingrown hair tips. Follow these instructions at home:  Medicines  Take, apply, or use over-the-counter and prescription medicines only as told by your health care provider. This includes any prescription creams.  If you were prescribed an antibiotic medicine, take it as told by your health care provider. Do not stop using the antibiotic even if you start to feel better. General instructions  Do not shave irritated areas of skin. You may start shaving these areas again once the irritation has gone away.  To help remove ingrown hairs on your face, you may use a facial sponge in a gentle circular motion.  Do not pick or squeeze the irritated area of skin as this may cause infection and scarring. Managing pain and swelling  If directed, apply heat to the affected area as often as told by your health care  provider. Use the heat source that your health care provider recommends, such as a moist heat pack or a heating pad. ? Place a towel between your skin and the heat source. ? Leave the heat on for 20-30 minutes. ? Remove the heat if your skin turns bright red. This is especially important if you are unable to feel pain, heat, or cold. You may have a greater risk of getting burned. How is this prevented?  Shower before shaving.  Wrap areas that you are going to shave in warm, moist wraps for several minutes before shaving. The warmth and moisture help to soften the hairs and makes ingrown hairs less likely.  Use thick shaving gels.  Use a razor that cuts hair slightly above your skin, or use an electric shaver with a long shave setting.  Shave in the direction of hair growth.  Avoid making multiple razor strokes.  Apply a moisturizing lotion after shaving. Summary  An ingrown hair is a hair that curls and re-enters the skin instead of growing straight out of the skin.  Treatment is often not needed unless the ingrown hair has caused an infection.  Take, apply, or use over-the-counter and prescription medicines only as told by your health care provider. This includes any prescription creams.  Do not shave irritated areas of skin. You may start shaving these areas again once the irritation has gone away.  If directed, apply heat to the affected area. Use the heat source that your health care provider   recommends, such as a moist heat pack or a heating pad. This information is not intended to replace advice given to you by your health care provider. Make sure you discuss any questions you have with your health care provider. Document Revised: 12/19/2017 Document Reviewed: 12/19/2017 Elsevier Patient Education  2020 [employer]  

## 2019-12-01 ENCOUNTER — Telehealth: Payer: Self-pay | Admitting: Family Medicine

## 2019-12-01 ENCOUNTER — Other Ambulatory Visit: Payer: Self-pay

## 2019-12-01 ENCOUNTER — Emergency Department (HOSPITAL_COMMUNITY)
Admission: EM | Admit: 2019-12-01 | Discharge: 2019-12-01 | Disposition: A | Discharge status: Home / Self Care | Payer: BC Managed Care – PPO | Attending: Emergency Medicine | Admitting: Emergency Medicine

## 2019-12-01 ENCOUNTER — Encounter (HOSPITAL_COMMUNITY): Payer: Self-pay

## 2019-12-01 DIAGNOSIS — Z20822 Contact with and (suspected) exposure to covid-19: Secondary | ICD-10-CM | POA: Insufficient documentation

## 2019-12-01 DIAGNOSIS — Z79899 Other long term (current) drug therapy: Secondary | ICD-10-CM | POA: Insufficient documentation

## 2019-12-01 DIAGNOSIS — Z793 Long term (current) use of hormonal contraceptives: Secondary | ICD-10-CM | POA: Insufficient documentation

## 2019-12-01 DIAGNOSIS — F17210 Nicotine dependence, cigarettes, uncomplicated: Secondary | ICD-10-CM | POA: Diagnosis not present

## 2019-12-01 DIAGNOSIS — R0981 Nasal congestion: Secondary | ICD-10-CM | POA: Diagnosis present

## 2019-12-01 DIAGNOSIS — J069 Acute upper respiratory infection, unspecified: Secondary | ICD-10-CM

## 2019-12-01 LAB — POC SARS CORONAVIRUS 2 AG -  ED: SARS Coronavirus 2 Ag: NEGATIVE

## 2019-12-01 MED ORDER — FLUTICASONE PROPIONATE 50 MCG/ACT NA SUSP: 2.0000 | Freq: Every day | NASAL | 2 refills | Status: DC

## 2019-12-01 NOTE — Telephone Encounter (Signed)
Thank you for offering virtual appointment.

## 2019-12-01 NOTE — ED Triage Notes (Signed)
Patient c/o chills, body aches, nasal congestion x 2 days.

## 2019-12-01 NOTE — ED Provider Notes (Signed)
[facility] DEPT Provider Note   CSN: [id number] Arrival date & time: 12/01/19  3299     History Chief Complaint  Patient presents with  . Chills  . Headache  . Nasal Congestion  . Generalized Body Aches    Monique Underwood is a 24 y.o. female with no significant past medical history that presents to  the emergency department today for upper respiratory infection symptoms that started 2 days ago.  States that 2 days ago she started having congestion, chills, body aches, headache.  Patient states that headache was worse yesterday, states that it has become mild today.  States that congestion and myalgias have been worsening.  States that she has been taking her temperature, denies any fever.  Denies cough, sore throat, chest pain, shortness of breath, nausea, vomiting, anosmia, ear pain, eye discharge, vision changes, dizziness, weakness, leg swelling,parasethesias.  Patient states that she did have some diarrhea last night, one episode.  Nonbloody.  Patient has not had Covid vaccines, has never had Covid.  Patient denies any sick contacts.  States that myalgias are mainly in back and lower joints.  Patient states she was able to walk here without any difficulty.  Has tried NyQuil last night without much relief.  Denies any dysuria or hematuria.  Patient states that she has been drinking water and eating normally. HPI     Past Medical History:  Diagnosis Date  . Anxiety   . EDS (Ehlers-Danlos syndrome)    EDS 3  . History of chlamydia infection 01/04/2017  . Migraine with aura   . Ovarian cyst   . POTS (postural orthostatic tachycardia syndrome)   . Rotavirus infection 1/25 - 07/22/02   Hospitalized  . Syncope     Patient Active Problem List   Diagnosis Date Noted  . Arthralgia 06/11/2018  . EDS (Ehlers-Danlos syndrome) 04/09/2018  . POTS (postural orthostatic tachycardia syndrome) 04/09/2018  . Burning with urination 01/02/2018  . Vulvar itching  01/02/2018  . Left flank pain 01/02/2018  . Chills (without fever) 01/02/2018  . Screen for STD (sexually transmitted disease) 11/01/2017  . Nexplanon in place 04/27/2017  . Nausea 04/22/2017  . Viral URI 04/22/2017  . SVT (supraventricular tachycardia) ([place]) 01/14/2015  . Racing heart beat 12/17/2014  . Syncope 12/11/2014  . Anxiety state 12/11/2014  . Advance care planning 01/17/2013  . ADD (attention deficit disorder) 06/24/2012  . FEBRILE SEIZURE, PROLONGED 10/29/2009    Past Surgical History:  Procedure Laterality Date  . DILATION AND EVACUATION N/A 04/18/2016   Procedure: DILATATION AND EVACUATION;  Surgeon: Rubie Maid, MD;  Location: [facility] ORS;  Service: Gynecology;  Laterality: N/A;  . 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 Febrile seizure  7/99   [facility] - w/u negative  . MRI - head     NML     OB History    Gravida  1   Para  0   Term  0   Preterm  0   AB  1   Living  0     SAB  1   TAB  0   Ectopic  0   Multiple  0   Live Births              Family History  Problem Relation Age of Onset  . Thyroid disease Mother   . Ehlers-Danlos syndrome Father   . Cancer Other   . Diabetes Paternal Grandmother   . Breast cancer Neg  Hx   . Ovarian cancer Neg Hx   . Colon cancer Neg Hx     Social History   Tobacco Use  . Smoking status: Current Every Day Smoker    Packs/day: 0.50    Years: 3.00    Pack years: 1.50    Types: E-cigarettes  . Smokeless tobacco: Never Used  Substance Use Topics  . Alcohol use: Yes    Alcohol/week: 0.0 standard drinks    Comment: occass  . Drug use: No    Home Medications Prior to Admission medications   Medication Sig Start Date End Date Taking? Authorizing Provider  etonogestrel (NEXPLANON) 68 MG IMPL implant 1 each by Subdermal route once.    [provider]  fluticasone (FLONASE) 50 MCG/ACT nasal spray Place 2 sprays into both nostrils daily. 12/01/19   Farrel Gordon, PA-C  hydrocortisone (ANUSOL-HC) 2.5 %  rectal cream Place 1 application rectally 2 (two) times daily. 05/10/18   Hildred Laser, MD  lidocaine (XYLOCAINE) 2 % jelly Apply 1 application topically as needed. 05/10/18   Hildred Laser, MD  metoprolol tartrate (LOPRESSOR) 25 MG tablet TAKE HALF TO ONE TABLET BY MOUTH 2 (TWO) TIMES DAILY AS NEEDED(FOR ANXIETY/ELEVATED HEART RATE 02/17/18   Joaquim Nam, MD  ondansetron (ZOFRAN-ODT) 4 MG disintegrating tablet TAKE 1 TABLET BY MOUTH EVERY 8 HOURS AS NEEDED FOR NAUSEA AND VOMITING 10/31/17   Joaquim Nam, MD    Allergies    Shellfish allergy and Lexapro [escitalopram oxalate]  Review of Systems   Review of Systems  Constitutional: Positive for chills. Negative for diaphoresis, fatigue and fever.  HENT: Positive for congestion and rhinorrhea. Negative for dental problem, drooling, ear discharge, ear pain, facial swelling, hearing loss, mouth sores, nosebleeds, postnasal drip, sinus pressure, sinus pain, sneezing, sore throat, tinnitus, trouble swallowing and voice change.   Eyes: Negative for photophobia, pain and visual disturbance.  Respiratory: Negative for cough, choking, chest tightness, shortness of breath and wheezing.   Cardiovascular: Negative for chest pain, palpitations and leg swelling.  Gastrointestinal: Positive for diarrhea. Negative for abdominal distention, abdominal pain, nausea and vomiting.  Genitourinary: Negative for difficulty urinating, dysuria and flank pain.  Musculoskeletal: Positive for myalgias. Negative for back pain, neck pain and neck stiffness.  Skin: Negative for pallor.  Neurological: Negative for dizziness, syncope, facial asymmetry, speech difficulty, weakness, light-headedness and headaches.  Psychiatric/Behavioral: Negative for confusion.    Physical Exam Updated Vital Signs BP (!) 130/92   Pulse 88   Temp 98.5 F (36.9 C) (Oral)   Resp 16   Ht 5\' 8"  (1.727 m)   Wt 56.7 kg   SpO2 99%   BMI 19.01 kg/m   Physical Exam Constitutional:        General: She is not in acute distress.    Appearance: Normal appearance. She is not ill-appearing, toxic-appearing or diaphoretic.     Comments: Patient without acute respiratory stress.  Patient is sitting comfortably in bed, no tripoding,no use of accessory muscles.  Patient is speaking to me in full sentences.  Handling secretions well.  HENT:     Head: Normocephalic and atraumatic.     Jaw: There is normal jaw occlusion. No trismus, swelling or malocclusion.     Nose: Congestion present. No rhinorrhea.     Right Sinus: No maxillary sinus tenderness or frontal sinus tenderness.     Left Sinus: No maxillary sinus tenderness or frontal sinus tenderness.     Mouth/Throat:     Mouth: Mucous membranes  are moist. No oral lesions.     Dentition: Normal dentition.     Tongue: No lesions.     Palate: No mass and lesions.     Pharynx: Oropharynx is clear. Uvula midline. No pharyngeal swelling, oropharyngeal exudate, posterior oropharyngeal erythema or uvula swelling.     Tonsils: No tonsillar exudate or tonsillar abscesses. 1+ on the right. 1+ on the left.     Comments: Patient without tonsillar enlargement or exudate.  No signs of peritonsillar abscess, palate without any tenderness or masses palpated.  No swelling under the tongue, uvula is midline without any inflammation. Eyes:     General: No visual field deficit.       Right eye: No discharge.        Left eye: No discharge.     Extraocular Movements: Extraocular movements intact.     Conjunctiva/sclera: Conjunctivae normal.     Pupils: Pupils are equal, round, and reactive to light.  Cardiovascular:     Rate and Rhythm: Normal rate and regular rhythm.     Pulses: Normal pulses.     Heart sounds: Normal heart sounds. No murmur. No friction rub. No gallop.   Pulmonary:     Effort: Pulmonary effort is normal. No respiratory distress.     Breath sounds: Normal breath sounds. No stridor. No wheezing, rhonchi or rales.  Chest:     Chest  wall: No tenderness.  Abdominal:     General: Abdomen is flat. Bowel sounds are normal. There is no distension.     Palpations: Abdomen is soft.     Tenderness: There is no abdominal tenderness. There is no right CVA tenderness or left CVA tenderness.  Musculoskeletal:        General: No swelling or tenderness. Normal range of motion.     Cervical back: Normal range of motion. No rigidity or tenderness.     Right lower leg: No edema.     Left lower leg: No edema.  Lymphadenopathy:     Cervical: No cervical adenopathy.  Skin:    General: Skin is warm and dry.     Capillary Refill: Capillary refill takes less than 2 seconds.     Findings: No erythema or rash.  Neurological:     General: No focal deficit present.     Mental Status: She is alert and oriented to person, place, and time.     Cranial Nerves: Cranial nerves are intact. No cranial nerve deficit or facial asymmetry.     Motor: Motor function is intact. No weakness.     Coordination: Coordination is intact.     Gait: Gait is intact. Gait normal.  Psychiatric:        Mood and Affect: Mood normal.     ED Results / Procedures / Treatments   Labs (all labs ordered are listed, but only abnormal results are displayed) Labs Reviewed  POC SARS CORONAVIRUS 2 AG -  ED    EKG None  Radiology No results found.  Procedures Procedures (including critical care time)  Medications Ordered in ED Medications - No data to display  ED Course  I have reviewed the triage vital signs and the nursing notes.  Pertinent labs & imaging results that were available during my care of the patient were reviewed by me and considered in my medical decision making (see chart for details).    MDM Rules/Calculators/A&P  RAJAH LAMBA is a 24 y.o. female that presents to the emergency department today for upper respiratory infection symptoms that started 2 days ago.  States that 2 days ago she started having congestion,  chills, body aches, headache.  Patient with normal exam, patient's headache has improved, states that it is mild today not the worst headache of her life.  Vital signs are stable.  Patient most likely has upper respiratory infection.  Will Covid swab today.  Covid swab negative.  Will get PCR at that time.  Patient drinking water in the room, feels better. Doubt need for further emergent work up at this time. I explained the diagnosis and have given explicit precautions to return to the ER including for any other new or worsening symptoms. The patient understands and accepts the medical plan as it's been dictated and I have answered their questions. Discharge instructions concerning home care and prescriptions have been given. The patient is STABLE and is discharged to home in good condition. Will discharge home with Flonase, spoke to patient about return precautions and follow-up with PCP if symptoms are prolonged.  Patient is agreeable for discharge.  Final Clinical Impression(s) / ED Diagnoses Final diagnoses:  Viral upper respiratory tract infection    Rx / DC Orders ED Discharge Orders         Ordered    fluticasone (FLONASE) 50 MCG/ACT nasal spray  Daily     12/01/19 1048           Farrel Gordon, PA-C 12/01/19 1057    Lorre Nick, MD 12/08/19 [phone]

## 2019-12-01 NOTE — Telephone Encounter (Signed)
Patient called today stating she has congestion, sore throat, and body aches. She stated she has not been able to take her temp because she does not have a thermometer.  Advised patient due to policies I could not bring her in with those symptoms but could do a virtual visit with her provider. Patient declined and stated she may go to a walk in or UC  JUST FYI

## 2019-12-01 NOTE — Discharge Instructions (Addendum)
You were seen today for upper respiratory infection, this is most likely viral as we spoke about.  Your point-of-care Covid test was negative, however your other PCR test is pending. You can access your results on your MyChart--if you test positive you should receive a phone call.  I want you to follow-up with your primary care doctor as we spoke about if you start having any worsening symptoms or your symptoms prolonged for the next 3 days.  Use the Flonase daily for the next 2 weeks, this will be with your congestion.  You can also use over-the-counter cough and cold medications as we spoke about.  Take Tylenol as directed on the bottle for pain.  Be sure to drink plenty water, use the guide attached. In the meantime follow CDC guidelines and quarantine, wear a mask, wash hands often.    Please return to ED if you feel have difficulty breathing or have emergent, new or concerning symptoms.  Patients who have symptoms consistent with COVID-19 should self isolated for: At least 3 days (72 hours) have passed since recovery, defined as resolution of fever without the use of fever reducing medications and improvement in respiratory symptoms (e.g., cough, shortness of breath), and At least 7 days have passed since symptoms first appeared.       Person Under Monitoring Name: Monique Underwood  Location: [address] [place] [address]   Infection Prevention Recommendations for Individuals Confirmed to have, or Being Evaluated for, 2019 Novel Coronavirus (COVID-19) Infection Who Receive Care at Home  Individuals who are confirmed to have, or are being evaluated for, COVID-19 should follow the prevention steps below until a healthcare provider or local or state health department says they can return to normal activities.  Stay home except to get medical care You should restrict activities outside your home, except for getting medical care. Do not go to work, school, or public areas, and do  not use public transportation or taxis.  Call ahead before visiting your doctor Before your medical appointment, call the healthcare provider and tell them that you have, or are being evaluated for, COVID-19 infection. This will help the healthcare provider's office take steps to keep other people from getting infected. Ask your healthcare provider to call the local or state health department.  Monitor your symptoms Seek prompt medical attention if your illness is worsening (e.g., difficulty breathing). Before going to your medical appointment, call the healthcare provider and tell them that you have, or are being evaluated for, COVID-19 infection. Ask your healthcare provider to call the local or state health department.  Wear a facemask You should wear a facemask that covers your nose and mouth when you are in the same room with other people and when you visit a healthcare provider. People who live with or visit you should also wear a facemask while they are in the same room with you.  Separate yourself from other people in your home As much as possible, you should stay in a different room from other people in your home. Also, you should use a separate bathroom, if available.  Avoid sharing household items You should not share dishes, drinking glasses, cups, eating utensils, towels, bedding, or other items with other people in your home. After using these items, you should wash them thoroughly with soap and water.  Cover your coughs and sneezes Cover your mouth and nose with a tissue when you cough or sneeze, or you can cough or sneeze into your sleeve.  Throw used tissues in a lined trash can, and immediately wash your hands with soap and water for at least 20 seconds or use an alcohol-based hand rub.  Wash your [employer] your hands often and thoroughly with soap and water for at least 20 seconds. You can use an alcohol-based hand sanitizer if soap and water are not available and  if your hands are not visibly dirty. Avoid touching your eyes, nose, and mouth with unwashed hands.   Prevention Steps for Caregivers and Household Members of Individuals Confirmed to have, or Being Evaluated for, COVID-19 Infection Being Cared for in the Home  If you live with, or provide care at home for, a person confirmed to have, or being evaluated for, COVID-19 infection please follow these guidelines to prevent infection:  Follow healthcare provider's instructions Make sure that you understand and can help the patient follow any healthcare provider instructions for all care.  Provide for the patient's basic needs You should help the patient with basic needs in the home and provide support for getting groceries, prescriptions, and other personal needs.  Monitor the patient's symptoms If they are getting sicker, call his or her medical provider and tell them that the patient has, or is being evaluated for, COVID-19 infection. This will help the healthcare provider's office take steps to keep other people from getting infected. Ask the healthcare provider to call the local or state health department.  Limit the number of people who have contact with the patient If possible, have only one caregiver for the patient. Other household members should stay in another home or place of residence. If this is not possible, they should stay in another room, or be separated from the patient as much as possible. Use a separate bathroom, if available. Restrict visitors who do not have an essential need to be in the home.  Keep older adults, very young children, and other sick people away from the patient Keep older adults, very young children, and those who have compromised immune systems or chronic health conditions away from the patient. This includes people with chronic heart, lung, or kidney conditions, diabetes, and cancer.  Ensure good ventilation Make sure that shared spaces in the home  have good air flow, such as from an air conditioner or an opened window, weather permitting.  Wash your hands often Wash your hands often and thoroughly with soap and water for at least 20 seconds. You can use an alcohol based hand sanitizer if soap and water are not available and if your hands are not visibly dirty. Avoid touching your eyes, nose, and mouth with unwashed hands. Use disposable paper towels to dry your hands. If not available, use dedicated cloth towels and replace them when they become wet.  Wear a facemask and gloves Wear a disposable facemask at all times in the room and gloves when you touch or have contact with the patient's blood, body fluids, and/or secretions or excretions, such as sweat, saliva, sputum, nasal mucus, vomit, urine, or feces.  Ensure the mask fits over your nose and mouth tightly, and do not touch it during use. Throw out disposable facemasks and gloves after using them. Do not reuse. Wash your hands immediately after removing your facemask and gloves. If your personal clothing becomes contaminated, carefully remove clothing and launder. Wash your hands after handling contaminated clothing. Place all used disposable facemasks, gloves, and other waste in a lined container before disposing them with other household waste. Remove gloves and wash your  hands immediately after handling these items.  Do not share dishes, glasses, or other household items with the patient Avoid sharing household items. You should not share dishes, drinking glasses, cups, eating utensils, towels, bedding, or other items with a patient who is confirmed to have, or being evaluated for, COVID-19 infection. After the person uses these items, you should wash them thoroughly with soap and water.  Wash laundry thoroughly Immediately remove and wash clothes or bedding that have blood, body fluids, and/or secretions or excretions, such as sweat, saliva, sputum, nasal mucus, vomit, urine, or  feces, on them. Wear gloves when handling laundry from the patient. Read and follow directions on labels of laundry or clothing items and detergent. In general, wash and dry with the warmest temperatures recommended on the label.  Clean all areas the individual has used often Clean all touchable surfaces, such as counters, tabletops, doorknobs, bathroom fixtures, toilets, phones, keyboards, tablets, and bedside tables, every day. Also, clean any surfaces that may have blood, body fluids, and/or secretions or excretions on them. Wear gloves when cleaning surfaces the patient has come in contact with. Use a diluted bleach solution (e.g., dilute bleach with 1 part bleach and 10 parts water) or a household disinfectant with a label that says EPA-registered for coronaviruses. To make a bleach solution at home, add 1 tablespoon of bleach to 1 quart (4 cups) of water. For a larger supply, add  cup of bleach to 1 gallon (16 cups) of water. Read labels of cleaning products and follow recommendations provided on product labels. Labels contain instructions for safe and effective use of the cleaning product including precautions you should take when applying the product, such as wearing gloves or eye protection and making sure you have good ventilation during use of the product. Remove gloves and wash hands immediately after cleaning.  Monitor yourself for signs and symptoms of illness Caregivers and household members are considered close contacts, should monitor their health, and will be asked to limit movement outside of the home to the extent possible. Follow the monitoring steps for close contacts listed on the symptom monitoring form.   ? If you have additional questions, contact your local health department or call the epidemiologist on call at [phone] (available 24/7). ? This guidance is subject to change. For the most up-to-date guidance from [facility], please refer to their  website: [employer]

## 2019-12-10 ENCOUNTER — Encounter: Payer: BC Managed Care – PPO | Admitting: Obstetrics and Gynecology

## 2019-12-31 ENCOUNTER — Encounter: Payer: Self-pay | Admitting: Obstetrics and Gynecology

## 2019-12-31 ENCOUNTER — Ambulatory Visit: Payer: BC Managed Care – PPO | Admitting: Obstetrics and Gynecology

## 2019-12-31 ENCOUNTER — Other Ambulatory Visit: Payer: Self-pay

## 2019-12-31 VITALS — BP 110/71 | HR 103 | Ht 68.0 in | Wt 118.2 lb

## 2019-12-31 DIAGNOSIS — N764 Abscess of vulva: Secondary | ICD-10-CM | POA: Diagnosis not present

## 2019-12-31 DIAGNOSIS — Z113 Encounter for screening for infections with a predominantly sexual mode of transmission: Secondary | ICD-10-CM | POA: Diagnosis not present

## 2019-12-31 MED ORDER — CLINDAMYCIN PHOSPHATE 1 % EX GEL: Freq: Two times a day (BID) | CUTANEOUS | 0 refills | Status: DC

## 2019-12-31 NOTE — Progress Notes (Signed)
    GYNECOLOGY PROGRESS NOTE  Subjective:    Patient ID: Monique Underwood, female    DOB: Oct 06, 1995, 24 y.o.   MRN: [id number]  HPI  Patient is a 24 y.o. G27P0010 female who presents for complaints of recurrent bump on the vulva.  She has been seen recently for an area of folliculitis, however notes that the lesion continues to recur in the same spot. Has been performing OTC treatments such as warm compresses, witch hazel.   The following portions of the patient's history were reviewed and updated as appropriate: allergies, current medications, past family history, past medical history, past social history, past surgical history and problem list.  Review of Systems Pertinent items noted in HPI and remainder of comprehensive ROS otherwise negative.   Objective:   Blood pressure 110/71, pulse (!) 103, height 5\' 8"  (1.727 m), weight 118 lb 3.2 oz (53.6 kg), last menstrual period 12/12/2019. General appearance: alert and no distress Pelvis: external genitalia with carbuncle on the right mons pubis. Internal exam not performed.    Assessment:   Carbuncle HSV screening   Plan:   Vulvar lesion with appearance of carbuncle but will rule out HSV.  Clindamycin gel ordered. Also continue to perform sitz baths/warm compresses to area and place Vaseline on areas when wearing underwear.    12/14/2019, MD [facility]

## 2019-12-31 NOTE — Progress Notes (Signed)
Pt present for ingrown hair/bump on the vaginal area. Pt stated that she noticed that the area will pop and then another one will appear in the same area.

## 2019-12-31 NOTE — Patient Instructions (Signed)

## 2020-01-04 LAB — HERPES SIMPLEX VIRUS CULTURE

## 2020-01-16 ENCOUNTER — Other Ambulatory Visit: Payer: Self-pay

## 2020-01-16 MED ORDER — METRONIDAZOLE 500 MG PO TABS
500.0000 mg | ORAL_TABLET | Freq: Two times a day (BID) | ORAL | 0 refills | Status: DC
Start: 2020-01-16 — End: 2020-02-11

## 2020-02-11 ENCOUNTER — Ambulatory Visit (INDEPENDENT_AMBULATORY_CARE_PROVIDER_SITE_OTHER): Payer: BC Managed Care – PPO | Admitting: Obstetrics and Gynecology

## 2020-02-11 ENCOUNTER — Other Ambulatory Visit: Payer: Self-pay

## 2020-02-11 ENCOUNTER — Encounter: Payer: Self-pay | Admitting: Obstetrics and Gynecology

## 2020-02-11 VITALS — BP 110/74 | HR 108 | Ht 68.0 in | Wt 116.7 lb

## 2020-02-11 DIAGNOSIS — Z30011 Encounter for initial prescription of contraceptive pills: Secondary | ICD-10-CM

## 2020-02-11 DIAGNOSIS — Z3046 Encounter for surveillance of implantable subdermal contraceptive: Secondary | ICD-10-CM

## 2020-02-11 MED ORDER — NORETHIN-ETH ESTRAD-FE BIPHAS 1 MG-10 MCG / 10 MCG PO TABS
1.0000 | ORAL_TABLET | Freq: Every day | ORAL | 3 refills | Status: DC
Start: 2020-02-11 — End: 2020-09-10

## 2020-02-11 NOTE — Progress Notes (Signed)
Pt present for Nexplanon removal. Pt stated that she did not want to replace the Nexplanon at this time but would like to discuss other birth control options.

## 2020-02-11 NOTE — Progress Notes (Signed)
     GYNECOLOGY OFFICE PROCEDURE NOTE  Monique Underwood is a 24 y.o. G1P0010 here for Nexplanon removal.  Has had Nexplanon in place for 3 years. Last pap smear was on 04/2017 and was normal.  No other gynecologic concerns.   Nexplanon Removal Patient identified, informed consent performed, consent signed.   Appropriate time out taken. Nexplanon site identified.  Area prepped in usual sterile fashon. Two ml of 1% lidocaine was used to anesthetize the area at the distal end of the implant. A small stab incision was made right beside the implant on the distal portion.  The Nexplanon rod was grasped using hemostats and removed without difficulty.  There was minimal blood loss. There were no complications.   Steri-strips were applied over the small incision.  A pressure bandage was applied to reduce any bruising.  The patient tolerated the procedure well and was given post procedure instructions.  Patient is planning to use combined OCPs for contraception.  Advised on use of back-up method for 1 week. Given sample of Lo-Loestrin, also has prescription ordered.

## 2020-02-11 NOTE — Patient Instructions (Signed)

## 2020-03-22 ENCOUNTER — Telehealth: Payer: Self-pay

## 2020-03-22 NOTE — Telephone Encounter (Signed)
Pt called in and stated she took a pregnancy test  And that the line was faint. Her last lmp was 02/13/2020. The pt stated that she wanted to see Dr. Valentino Saxon soon. ( She is out of the office till next till Friday), I told the pt I will send a message to the nurse.   The pt was requesting to be seen ASAP due to her EDS. Please advise

## 2020-03-22 NOTE — Telephone Encounter (Signed)
Scheduled patient for confirmation with Dr. Logan Bores on Friday since Dr. Valentino Saxon is out of the office. Patient is concerns because she has EDS and would like to be seen sooner than Dr. Valentino Saxon has available. Let the patient know that we can get her in with Dr. Valentino Saxon for OB physical. Patient verbalized understanding.

## 2020-03-26 ENCOUNTER — Ambulatory Visit (INDEPENDENT_AMBULATORY_CARE_PROVIDER_SITE_OTHER): Payer: BC Managed Care – PPO | Admitting: Obstetrics and Gynecology

## 2020-03-26 ENCOUNTER — Encounter: Payer: Self-pay | Admitting: Obstetrics and Gynecology

## 2020-03-26 ENCOUNTER — Other Ambulatory Visit: Payer: Self-pay

## 2020-03-26 VITALS — BP 109/76 | HR 89 | Ht 68.0 in | Wt 112.8 lb

## 2020-03-26 DIAGNOSIS — N926 Irregular menstruation, unspecified: Secondary | ICD-10-CM | POA: Diagnosis not present

## 2020-03-26 LAB — POCT URINE PREGNANCY: Preg Test, Ur: NEGATIVE

## 2020-03-26 NOTE — Progress Notes (Signed)
HPI:      [name] is a 24 y.o. G1P0010 who LMP was No LMP recorded.  Subjective:   She presents today because she has missed a menstrual period.  She had her IUD removed and then went on OCPs.  Her first pack of pills was then completed without having a menses.  She did a pregnancy test at home and was found to be faintly positive.  She followed this with several other tests which were negative.  She has concerns regarding the fact that she has missed a period and possibly had a faint home positive test.  She is now 1 week into her second pack of OCPs.    Hx: The following portions of the patient's history were reviewed and updated as appropriate:             She  has a past medical history of Anxiety, EDS (Ehlers-Danlos syndrome), History of chlamydia infection (01/04/2017), Migraine with aura, Ovarian cyst, POTS (postural orthostatic tachycardia syndrome), Rotavirus infection (1/25 - 07/22/02), and Syncope. She does not have any pertinent problems on file. She  has a past surgical history that includes Febrile seizure (7/99); MRI - head; EEG (7/99); and Dilation and evacuation (N/A, 04/18/2016). Her family history includes Breast cancer in her paternal grandmother; Cancer in an other family member; Dementia in her maternal grandmother; Diabetes in her paternal grandmother; Ehlers-Danlos syndrome in her father; Thyroid disease in her mother. She  reports that she has been smoking e-cigarettes. She has a 1.50 pack-year smoking history. She has never used smokeless tobacco. She reports current alcohol use. She reports that she does not use drugs. She has a current medication list which includes the following prescription(s): clindamycin, hydrocortisone, lidocaine, metoprolol tartrate, norethindrone-ethinyl estradiol-fe biphas, and ondansetron. She is allergic to shellfish allergy and lexapro [escitalopram oxalate].       Review of Systems:  Review of Systems  Constitutional: Denied  constitutional symptoms, night sweats, recent illness, fatigue, fever, insomnia and weight loss.  Eyes: Denied eye symptoms, eye pain, photophobia, vision change and visual disturbance.  Ears/Nose/Throat/Neck: Denied ear, nose, throat or neck symptoms, hearing loss, nasal discharge, sinus congestion and sore throat.  Cardiovascular: Denied cardiovascular symptoms, arrhythmia, chest pain/pressure, edema, exercise intolerance, orthopnea and palpitations.  Respiratory: Denied pulmonary symptoms, asthma, pleuritic pain, productive sputum, cough, dyspnea and wheezing.  Gastrointestinal: Denied, gastro-esophageal reflux, melena, nausea and vomiting.  Genitourinary: Denied genitourinary symptoms including symptomatic vaginal discharge, pelvic relaxation issues, and urinary complaints.  Musculoskeletal: Denied musculoskeletal symptoms, stiffness, swelling, muscle weakness and myalgia.  Dermatologic: Denied dermatology symptoms, rash and scar.  Neurologic: Denied neurology symptoms, dizziness, headache, neck pain and syncope.  Psychiatric: Denied psychiatric symptoms, anxiety and depression.  Endocrine: Denied endocrine symptoms including hot flashes and night sweats.   Meds:   Current Outpatient Medications on File Prior to Visit  Medication Sig Dispense Refill  . clindamycin (CLINDAGEL) 1 % gel Apply topically 2 (two) times daily. 30 g 0  . hydrocortisone (ANUSOL-HC) 2.5 % rectal cream Place 1 application rectally 2 (two) times daily. 30 g 0  . lidocaine (XYLOCAINE) 2 % jelly Apply 1 application topically as needed. 30 mL 2  . metoprolol tartrate (LOPRESSOR) 25 MG tablet TAKE HALF TO ONE TABLET BY MOUTH 2 (TWO) TIMES DAILY AS NEEDED(FOR ANXIETY/ELEVATED HEART RATE 180 tablet 1  . Norethindrone-Ethinyl Estradiol-Fe Biphas (LO LOESTRIN FE) 1 MG-10 MCG / 10 MCG tablet Take 1 tablet by mouth daily. 84 tablet 3  . ondansetron (ZOFRAN-ODT)  4 MG disintegrating tablet TAKE 1 TABLET BY MOUTH EVERY 8 HOURS AS  NEEDED FOR NAUSEA AND VOMITING 30 tablet 2   No current facility-administered medications on file prior to visit.    Objective:     Vitals:   03/26/20 1032  BP: 109/76  Pulse: 89   Filed Weights   03/26/20 1032  Weight: 112 lb 12.8 oz (51.2 kg)              Urinary pregnancy test negative  Assessment:    G1P0010 Patient Active Problem List   Diagnosis Date Noted  . Arthralgia 06/11/2018  . EDS (Ehlers-Danlos syndrome) 04/09/2018  . POTS (postural orthostatic tachycardia syndrome) 04/09/2018  . Burning with urination 01/02/2018  . Vulvar itching 01/02/2018  . Left flank pain 01/02/2018  . Chills (without fever) 01/02/2018  . Screen for STD (sexually transmitted disease) 11/01/2017  . Nexplanon in place 04/27/2017  . Nausea 04/22/2017  . Viral URI 04/22/2017  . SVT (supraventricular tachycardia) (HCC) 01/14/2015  . Racing heart beat 12/17/2014  . Syncope 12/11/2014  . Anxiety state 12/11/2014  . Advance care planning 01/17/2013  . ADD (attention deficit disorder) 06/24/2012  . FEBRILE SEIZURE, PROLONGED 10/29/2009     1. Missed period     Patient's pregnancy test is negative however she still has some doubt/concern about missing her menstrual period.   Plan:            1.  Serum hCG  2.  Continue OCPs -expect normal menses at the end of this pack. Orders Orders Placed This Encounter  Procedures  . Beta hCG quant (ref lab)  . POCT urine pregnancy    No orders of the defined types were placed in this encounter.     F/U  No follow-ups on file. I spent 21 minutes involved in the care of this patient preparing to see the patient by obtaining and reviewing her medical history (including labs, imaging tests and prior procedures), documenting clinical information in the electronic health record (EHR), counseling and coordinating care plans, writing and sending prescriptions, ordering tests or procedures and directly communicating with the patient by discussing  pertinent items from her history and physical exam as well as detailing my assessment and plan as noted above so that she has an informed understanding.  All of her questions were answered.  Elonda Husky, M.D. 03/26/2020 10:45 AM

## 2020-03-27 LAB — BETA HCG QUANT (REF LAB): hCG Quant: <1 m[IU]/mL

## 2020-05-14 ENCOUNTER — Encounter: Payer: BC Managed Care – PPO | Admitting: Obstetrics and Gynecology

## 2020-07-07 NOTE — Progress Notes (Signed)
Patient Care Team: Joaquim Nam, MD as PCP - General (Family Medicine)   HPI  Monique Underwood is a 25 y.o. 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  No marijuana but periodic alcohol, on the weekends but not more than 12 pack (or couple of cocktails)  Not exercising.  Salt repletion poor.  Difficulty with a.m. fluids because of nausea.  Showering at night and this is helped.  When she takes her metoprolol feels foggy.  Using it as needed.  Menses remain irregular but volume improved on hormonal therapy  Has been told that she has arthritis in her hands and MRI scanning apparently at orthopedics suggested arthritis in her knees.  Treated symptomatically.    Past Medical History:  Diagnosis Date  . Anxiety   . EDS (Ehlers-Danlos syndrome)    EDS 3  . History of chlamydia infection 01/04/2017  . Migraine with aura   . Ovarian cyst   . POTS (postural orthostatic tachycardia syndrome)   . Rotavirus infection 1/25 - 07/22/02   Hospitalized  . Syncope     Past Surgical History:  Procedure Laterality Date  . DILATION AND EVACUATION N/A 04/18/2016   Procedure: DILATATION AND EVACUATION;  Surgeon: Hildred Laser, MD;  Location: [facility] ORS;  Service: Gynecology;  Laterality: N/A;  . 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 Febrile seizure  7/99   [facility] - w/u negative  . MRI - head     NML    Current Outpatient Medications  Medication Sig Dispense Refill  . hydrocortisone (ANUSOL-HC) 2.5 % rectal cream Place 1 application rectally 2 (two) times daily. 30 g 0  . hydrocortisone 2.5 % cream Place rectally.    . lidocaine (XYLOCAINE) 2 % jelly Apply 1 application topically as  needed. 30 mL 2  . metoprolol tartrate (LOPRESSOR) 25 MG tablet TAKE HALF TO ONE TABLET BY MOUTH 2 (TWO) TIMES DAILY AS NEEDED(FOR ANXIETY/ELEVATED HEART RATE 180 tablet 1  . Norethindrone-Ethinyl Estradiol-Fe Biphas (LO LOESTRIN FE) 1 MG-10 MCG / 10 MCG tablet Take 1 tablet by mouth daily. 84 tablet 3  . ondansetron (ZOFRAN-ODT) 4 MG disintegrating tablet TAKE 1 TABLET BY MOUTH EVERY 8 HOURS AS NEEDED FOR NAUSEA AND VOMITING 30 tablet 2   No current facility-administered medications for this visit.    Allergies  Allergen Reactions  . Lexapro [Escitalopram Oxalate] Other (See Comments)    GI upset.       Review of Systems negative except from HPI and PMH  Physical Exam BP 102/60 (BP Location: Left Arm, Patient Position: Sitting, Cuff Size: Normal)   Pulse 99   Ht 5\' 7"  (1.702 m)   Wt 113 lb (51.3 kg)   SpO2 99%   BMI 17.70 kg/m  Well developed and nourished in no acute distress HENT normal Neck supple with JVP-  flat   Clear Regular rate and rhythm, no murmurs or gallops Abd-soft with active BS No Clubbing cyanosis edema Skin-warm and dry A & Oriented  Grossly normal sensory and motor function  ECG sinus at 99 Intervals 04/01/34 No preexcitation  Assessment and  Plan  Dysautonomia  Joint laxity   Ehlers-Danlos-3  Anxiety/depresson  Alcohol/vaping  Arthritis-poly?  Reviewed  physiology.  Encouraged increase sodium intake, ThermaTabs, NUUN, salt tablets to 2 to 3-4 g of sodium i.e. 8-10 g of sodium chloride per day  She tolerates propel, encouraged her to try propel in the morning.  Early morning water is associated with nausea Reviewed the impact of alcohol on fluid status and symptoms of dysautonomia.  Encouraged her to decrease.  Encouraged her to stop vaping, begin exercise.  Has follow-up today scheduled with OB/GYN regarding her menses.  She says that she has arthritis based on imaging.  I have suggested that she establish with a rheumatologist.  I  have given her Tobey Bride name in [place]

## 2020-07-08 ENCOUNTER — Other Ambulatory Visit: Payer: Self-pay

## 2020-07-08 ENCOUNTER — Encounter: Payer: Self-pay | Admitting: Internal Medicine

## 2020-07-08 ENCOUNTER — Ambulatory Visit (INDEPENDENT_AMBULATORY_CARE_PROVIDER_SITE_OTHER): Payer: PRIVATE HEALTH INSURANCE | Admitting: Internal Medicine

## 2020-07-08 VITALS — BP 102/60 | HR 99 | Ht 67.0 in | Wt 113.0 lb

## 2020-07-08 DIAGNOSIS — G901 Familial dysautonomia [Riley-Day]: Secondary | ICD-10-CM

## 2020-07-08 MED ORDER — ATENOLOL 25 MG PO TABS: ORAL_TABLET | ORAL | 0 refills | Status: DC

## 2020-07-08 MED ORDER — BISOPROLOL FUMARATE 5 MG PO TABS: ORAL_TABLET | ORAL | 0 refills | Status: DC

## 2020-07-08 NOTE — Patient Instructions (Addendum)
Medication Instructions:  - Your physician has recommended you make the following change in your medication:   1) Stop metoprolol  2) You are being given a trial of 2 alternative beta blockers to try. You may try either one first, but DO NOT take both at the same time:  - Atenolol 25 mg: take 1 tablet by mouth once daily as needed for tachycardia  - Bisoprolol 5 mg: take 0.5 tablet (2.5 mg) by mouth once daily as needed for tachycardia    *If you need a refill on your cardiac medications before your next appointment, please call your pharmacy*   Lab Work: - none ordered  If you have labs (blood work) drawn today and your tests are completely normal, you will receive your results only by: Marland Kitchen MyChart Message (if you have MyChart) OR . A paper copy in the mail If you have any lab test that is abnormal or we need to change your treatment, we will call you to review the results.   Testing/Procedures: - none ordered   Follow-Up: At [facility], you and your health needs are our priority.  As part of our continuing mission to provide you with exceptional heart care, we have created designated Provider Care Teams.  These Care Teams include your primary Cardiologist (physician) and Advanced Practice Providers (APPs -  Physician Assistants and Nurse Practitioners) who all work together to provide you with the care you need, when you need it.  We recommend signing up for the patient portal called "MyChart".  Sign up information is provided on this After Visit Summary.  MyChart is used to connect with patients for Virtual Visits (Telemedicine).  Patients are able to view lab/test results, encounter notes, upcoming appointments, etc.  Non-urgent messages can be sent to your provider as well.   To learn more about what you can do with MyChart, go to [email or web address].    Your next appointment:   6 month(s)  The format for your next appointment:   In Person  Provider:   Sherryl Manges, MD   Other Instructions:  1) Try salt supplmentation twice daily: - Nuun  - Vitassium - Liquid IV - Thermatabs  2) Alben Deeds, MD (Rheumatology) [address] [place], [place] [address] [phone]

## 2020-07-20 ENCOUNTER — Encounter: Payer: BC Managed Care – PPO | Admitting: Obstetrics and Gynecology

## 2020-09-10 ENCOUNTER — Encounter: Payer: Self-pay | Admitting: Family Medicine

## 2020-09-10 ENCOUNTER — Ambulatory Visit (INDEPENDENT_AMBULATORY_CARE_PROVIDER_SITE_OTHER): Payer: PRIVATE HEALTH INSURANCE | Admitting: Family Medicine

## 2020-09-10 ENCOUNTER — Other Ambulatory Visit: Payer: Self-pay

## 2020-09-10 VITALS — BP 120/70 | HR 118 | Ht 67.0 in | Wt 110.4 lb

## 2020-09-10 DIAGNOSIS — H5789 Other specified disorders of eye and adnexa: Secondary | ICD-10-CM

## 2020-09-10 MED ORDER — POLYMYXIN B-TRIMETHOPRIM 10000-0.1 UNIT/ML-% OP SOLN
2.0000 [drp] | OPHTHALMIC | 0 refills | Status: DC
Start: 2020-09-10 — End: 2022-02-08

## 2020-09-10 NOTE — Progress Notes (Signed)
Established Patient Office Visit  Subjective:  Patient ID: Monique Underwood, female    DOB: 1996-01-18  Age: 25 y.o. MRN: [id number]  CC:  Chief Complaint  Patient presents with  . swollen eye    Red & swollen, left eye, started this morning. Used a warm compress at work before she came here.     HPI Monique Underwood presents for left eye irritation.  She noticed some redness and swelling this morning.  Symptoms slightly improved through the day.  Mild itching.  Some mild discomfort of the lower lid.  She had just a small amount of drainage.  No eye injury.  No blurred vision.  She has had previous stye but this is different.  No recent change in make-up.  No contact use.  No periocular rashes  Past Medical History:  Diagnosis Date  . Anxiety   . EDS (Ehlers-Danlos syndrome)    EDS 3  . History of chlamydia infection 01/04/2017  . Migraine with aura   . Ovarian cyst   . POTS (postural orthostatic tachycardia syndrome)   . Rotavirus infection 1/25 - 07/22/02   Hospitalized  . Syncope     Past Surgical History:  Procedure Laterality Date  . DILATION AND EVACUATION N/A 04/18/2016   Procedure: DILATATION AND EVACUATION;  Surgeon: Hildred Laser, MD;  Location: [facility] ORS;  Service: Gynecology;  Laterality: N/A;  . 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 Febrile seizure  7/99   [facility] - w/u negative  . MRI - head     NML    Family History  Problem Relation Age of Onset  . Thyroid disease Mother   . Ehlers-Danlos syndrome Father   . Cancer Other   . Diabetes Paternal Grandmother   . Breast cancer Paternal Grandmother   . Dementia Maternal Grandmother   . Ovarian cancer Neg Hx   . Colon cancer Neg Hx     Social History   Socioeconomic History  . Marital status: Single    Spouse name: Not on file  . Number of children: Not on file  . Years of education: Not on file  . Highest education level: Not on file  Occupational History  . Not on file  Tobacco Use  . Smoking status: Current  Every Day Smoker    Packs/day: 0.50    Years: 3.00    Pack years: 1.50    Types: E-cigarettes  . Smokeless tobacco: Never Used  Vaping Use  . Vaping Use: Every day  . Substances: Nicotine  Substance and Sexual Activity  . Alcohol use: Yes    Alcohol/week: 0.0 standard drinks    Comment: occass  . Drug use: No  . Sexual activity: Yes    Comment: Nexplanon inserted 02/21/17  Other Topics Concern  . Not on file  Social History Narrative   Lives with boyfriend   Working at [employer] Resource Strain: Not on file  Food Insecurity: Not on file  Transportation Needs: Not on file  Physical Activity: Not on file  Stress: Not on file  Social Connections: Not on file  Intimate Partner Violence: Not on file    Outpatient Medications Prior to Visit  Medication Sig Dispense Refill  . atenolol (TENORMIN) 25 MG tablet Take 1 tablet (25 mg) by mouth once daily as needed for tachycardia 30 tablet 0  . bisoprolol (ZEBETA) 5 MG tablet Take 0.5 tablet (2.5 mg) by mouth  once daily as needed for tachycardia 15 tablet 0  . hydrocortisone (ANUSOL-HC) 2.5 % rectal cream Place 1 application rectally 2 (two) times daily. 30 g 0  . hydrocortisone 2.5 % cream Place rectally.    Colleen Can FE 1/20 1-20 MG-MCG tablet Take 1 tablet by mouth daily.    Marland Kitchen lidocaine (XYLOCAINE) 2 % jelly Apply 1 application topically as needed. 30 mL 2  . ondansetron (ZOFRAN-ODT) 4 MG disintegrating tablet TAKE 1 TABLET BY MOUTH EVERY 8 HOURS AS NEEDED FOR NAUSEA AND VOMITING 30 tablet 2  . Norethindrone-Ethinyl Estradiol-Fe Biphas (LO LOESTRIN FE) 1 MG-10 MCG / 10 MCG tablet Take 1 tablet by mouth daily. 84 tablet 3   No facility-administered medications prior to visit.    Allergies  Allergen Reactions  . Lexapro [Escitalopram Oxalate] Other (See Comments)    GI upset.     ROS Review of Systems  Constitutional: Negative for chills and fever.  Eyes: Positive for itching.  Negative for photophobia and visual disturbance.      Objective:    Physical Exam Vitals reviewed.  Constitutional:      Appearance: Normal appearance.  Eyes:     Extraocular Movements: Extraocular movements intact.     Pupils: Pupils are equal, round, and reactive to light.     Comments: Left eye reveals slightly more erythematous conjunctive of the left.  No purulent drainage noted at this time.  Pupils equal round reactive to light.  Fundi benign.  With fluorescein staining no evidence for foreign body or corneal defects.  No stye  Musculoskeletal:     Cervical back: Neck supple.  Lymphadenopathy:     Cervical: No cervical adenopathy.  Neurological:     Mental Status: She is alert.     BP 120/70   Pulse (!) 118   Ht 5\' 7"  (1.702 m)   Wt 110 lb 7 oz (50.1 kg)   BMI 17.30 kg/m  Wt Readings from Last 3 Encounters:  09/10/20 110 lb 7 oz (50.1 kg)  07/08/20 113 lb (51.3 kg)  03/26/20 112 lb 12.8 oz (51.2 kg)     Health Maintenance Due  Topic Date Due  . Hepatitis C Screening  Never done  . HPV VACCINES (1 - 2-dose series) Never done  . COVID-19 Vaccine (1) Never done  . TETANUS/TDAP  01/30/2017  . INFLUENZA VACCINE  Never done  . PAP-Cervical Cytology Screening  04/27/2020  . PAP SMEAR-Modifier  04/27/2020  . CHLAMYDIA SCREENING  05/13/2020       Topic Date Due  . HPV VACCINES (1 - 2-dose series) Never done    Lab Results  Component Value Date   TSH 0.82 12/16/2014   Lab Results  Component Value Date   WBC 7.5 01/02/2018   HGB 13.8 01/02/2018   HCT 41.4 01/02/2018   MCV 89 01/02/2018   PLT 256 01/02/2018   Lab Results  Component Value Date   NA 141 12/26/2017   K 3.9 12/26/2017   CO2 25 12/26/2017   GLUCOSE 95 12/26/2017   BUN 8 12/26/2017   CREATININE 0.79 12/26/2017   BILITOT 0.8 12/26/2017   ALKPHOS 49 12/26/2017   AST 20 12/26/2017   ALT 14 12/26/2017   PROT 7.4 12/26/2017   ALBUMIN 4.5 12/26/2017   CALCIUM 9.4 12/26/2017   ANIONGAP 8  12/26/2017   GFR 84.45 10/31/2017   Lab Results  Component Value Date   CHOL 131 01/17/2013   Lab Results  Component Value Date  HDL 43 01/17/2013   Lab Results  Component Value Date   LDLCALC 67 01/17/2013   Lab Results  Component Value Date   TRIG 106 01/17/2013   Lab Results  Component Value Date   CHOLHDL 3.0 01/17/2013   No results found for: HGBA1C    Assessment & Plan:   Left eye irritation.  Suspect possible early bacterial conjunctivitis.  No purulent secretions at this time but she does have slightly erythematous conjunctiva.  Otherwise normal eye exam.  This could be allergic irritation but no clear precipitants  -We decided to cover with Polytrim ophthalmic drops 2 drops left eye every 4 hours while awake -Follow-up promptly for any pain, visual changes or any persistent symptoms   Meds ordered this encounter  Medications  . trimethoprim-polymyxin b (POLYTRIM) ophthalmic solution    Sig: Place 2 drops into the left eye every 4 (four) hours.    Dispense:  10 mL    Refill:  0    Follow-up: No follow-ups on file.    Evelena Peat, MD

## 2022-01-13 ENCOUNTER — Ambulatory Visit: Payer: PRIVATE HEALTH INSURANCE | Admitting: Internal Medicine

## 2022-01-18 ENCOUNTER — Ambulatory Visit: Payer: PRIVATE HEALTH INSURANCE | Admitting: Internal Medicine

## 2022-02-01 NOTE — Progress Notes (Signed)
PCP:  Joaquim Nam, MD Primary Cardiologist: None Electrophysiologist: Sherryl Manges, MD   Monique Underwood is a 26 y.o. female seen today for Sherryl Manges, MD for routine electrophysiology followup.  Since last being seen in our clinic the patient reports doing well overall. She has only needed prn BB 1-2 times every several months. States she uses it as a Sports administrator" when she just can't manage her symptoms. She is pregnant and has her first OB appointment today. She has mild lightheadednessnd as well as palpitations. Overall, her symptoms are very well controlled.   Past Medical History:  Diagnosis Date   Anxiety    EDS (Ehlers-Danlos syndrome)    EDS 3   History of chlamydia infection 01/04/2017   Migraine with aura    Ovarian cyst    POTS (postural orthostatic tachycardia syndrome)    Rotavirus infection 1/25 - 07/22/02   Hospitalized   Syncope    Past Surgical History:  Procedure Laterality Date   DILATION AND EVACUATION N/A 04/18/2016   Procedure: DILATATION AND EVACUATION;  Surgeon: Hildred Laser, MD;  Location: [facility] ORS;  Service: Gynecology;  Laterality: N/A;   EEG  7/99   NML   Febrile seizure  7/99   [facility] - w/u negative   MRI - head     NML    Current Outpatient Medications  Medication Sig Dispense Refill   atenolol (TENORMIN) 25 MG tablet Take 1 tablet (25 mg) by mouth once daily as needed for tachycardia 30 tablet 0   bisoprolol (ZEBETA) 5 MG tablet Take 0.5 tablet (2.5 mg) by mouth once daily as needed for tachycardia 15 tablet 0   hydrocortisone (ANUSOL-HC) 2.5 % rectal cream Place 1 application rectally 2 (two) times daily. 30 g 0   hydrocortisone 2.5 % cream Place rectally.     JUNEL FE 1/20 1-20 MG-MCG tablet Take 1 tablet by mouth daily.     lidocaine (XYLOCAINE) 2 % jelly Apply 1 application topically as needed. 30 mL 2   ondansetron (ZOFRAN-ODT) 4 MG disintegrating tablet TAKE 1 TABLET BY MOUTH EVERY 8 HOURS AS NEEDED FOR NAUSEA AND  VOMITING 30 tablet 2   trimethoprim-polymyxin b (POLYTRIM) ophthalmic solution Place 2 drops into the left eye every 4 (four) hours. 10 mL 0   No current facility-administered medications for this visit.    Allergies  Allergen Reactions   Lexapro [Escitalopram Oxalate] Other (See Comments)    GI upset.     Social History   Socioeconomic History   Marital status: Single    Spouse name: Not on file   Number of children: Not on file   Years of education: Not on file   Highest education level: Not on file  Occupational History   Not on file  Tobacco Use   Smoking status: Every Day    Packs/day: 0.50    Years: 3.00    Total pack years: 1.50    Types: E-cigarettes, Cigarettes   Smokeless tobacco: Never  Vaping Use   Vaping Use: Every day   Substances: Nicotine  Substance and Sexual Activity   Alcohol use: Yes    Alcohol/week: 0.0 standard drinks of alcohol    Comment: occass   Drug use: No   Sexual activity: Yes    Comment: Nexplanon inserted 02/21/17  Other Topics Concern   Not on file  Social History Narrative   Lives with boyfriend   Working at [employer]  Financial Resource Strain: Not on file  Food Insecurity: Not on file  Transportation Needs: Not on file  Physical Activity: Not on file  Stress: Not on file  Social Connections: Not on file  Intimate Partner Violence: Not on file     Review of Systems: All other systems reviewed and are otherwise negative except as noted above.  Physical Exam: There were no vitals filed for this visit.  GEN- The patient is well appearing, alert and oriented x 3 today.   HEENT: normocephalic, atraumatic; sclera clear, conjunctiva pink; hearing intact; oropharynx clear; neck supple, no JVP Lymph- no cervical lymphadenopathy Lungs- Clear to ausculation bilaterally, normal work of breathing.  No wheezes, rales, rhonchi Heart- Regular rate and rhythm, no murmurs, rubs or gallops, PMI not  laterally displaced GI- soft, non-tender, non-distended, bowel sounds present, no hepatosplenomegaly Extremities- no clubbing, cyanosis, or edema; DP/PT/radial pulses 2+ bilaterally MS- no significant deformity or atrophy Skin- warm and dry, no rash or lesion Psych- euthymic mood, full affect Neuro- strength and sensation are intact  EKG is ordered. Personal review of EKG from today shows NSR at 90 bpm  Additional studies reviewed include: Previous EP office notes.   Assessment and Plan:  Dysautonomia   Joint laxity   Ehlers-Danlos-3   Anxiety/depresson   Alcohol/vaping   Arthritis-poly?  We discussed the role of salt and water repletion, the importance of exercise, often needing to be started in the recumbent position, and the awareness of triggers and the role of ambient heat and dehydration  Discussed with Pharm-D. Lopressor and Propranolol are generally considered ok for use in pregnancy, especially PRN.   Should avoid high dose BB. Atenolol is to be avoided in pregnancy. We have instructed her not to take this.   She will call [place] if she begins to have symptoms that warrant low dose BB, overall she hopes to be able to avoid.  She will also get input from her OB today.    Follow up with Dr. Graciela Husbands in 3 months to check back in. Should be also seen shortly after she gives birth.  BB use may also be limited in breastfeeding.   Graciella Freer, PA-C  02/01/22 8:32 AM

## 2022-02-08 ENCOUNTER — Encounter: Payer: Self-pay | Admitting: Student

## 2022-02-08 ENCOUNTER — Ambulatory Visit (INDEPENDENT_AMBULATORY_CARE_PROVIDER_SITE_OTHER): Payer: No Typology Code available for payment source | Admitting: Student

## 2022-02-08 VITALS — Ht 68.0 in | Wt 126.6 lb

## 2022-02-08 DIAGNOSIS — R000 Tachycardia, unspecified: Secondary | ICD-10-CM

## 2022-02-08 DIAGNOSIS — G901 Familial dysautonomia [Riley-Day]: Secondary | ICD-10-CM

## 2022-02-08 DIAGNOSIS — Q796 Ehlers-Danlos syndrome, unspecified: Secondary | ICD-10-CM

## 2022-02-08 NOTE — Patient Instructions (Signed)
Medication Instructions:  °Your physician recommends that you continue on your current medications as directed. Please refer to the Current Medication list given to you today. ° °*If you need a refill on your cardiac medications before your next appointment, please call your pharmacy* ° ° °Lab Work: °None °If you have labs (blood work) drawn today and your tests are completely normal, you will receive your results only by: °MyChart Message (if you have MyChart) OR °A paper copy in the mail °If you have any lab test that is abnormal or we need to change your treatment, we will call you to review the results. ° ° °Follow-Up: °At [facility], you and your health needs are our priority.  As part of our continuing mission to provide you with exceptional heart care, we have created designated Provider Care Teams.  These Care Teams include your primary Cardiologist (physician) and Advanced Practice Providers (APPs -  Physician Assistants and Nurse Practitioners) who all work together to provide you with the care you need, when you need it. ° ° °Your next appointment:   °3 month(s) ° °The format for your next appointment:   °In Person ° °Provider:   °Steven Klein, MD   °

## 2022-02-23 ENCOUNTER — Other Ambulatory Visit: Payer: Self-pay | Admitting: Obstetrics and Gynecology

## 2022-02-23 ENCOUNTER — Telehealth: Payer: Self-pay | Admitting: *Deleted

## 2022-02-23 ENCOUNTER — Other Ambulatory Visit (HOSPITAL_COMMUNITY): Payer: Self-pay | Admitting: Obstetrics and Gynecology

## 2022-02-23 DIAGNOSIS — O021 Missed abortion: Secondary | ICD-10-CM

## 2022-02-23 NOTE — Telephone Encounter (Signed)
   Pre-operative Risk Assessment    Patient Name: Monique Underwood  DOB: Sep 03, 1995 MRN: [id number]      Request for Surgical Clearance     Procedure:   D&E WITH ULTRASOUND AND ANORA TESTING (GENETIC TESTING)  Date of Surgery:  Clearance 03/01/22 URGENT                                Surgeon:  DR. Belva Agee Surgeon's Group or Practice Name:  [facility] Phone number:  [phone] Fax number:  [phone]   Type of Clearance Requested:   - Medical ; NO MEDICATIONS LISTED AS NEEDING TO BE HELD   Type of Anesthesia:   CHOICE   Additional requests/questions:    Elpidio Anis   02/23/2022, 12:45 PM

## 2022-02-23 NOTE — Telephone Encounter (Signed)
   Patient Name: Monique Underwood  DOB: 06-25-1996 MRN: [id number]  Primary Cardiologist: Dr. Sherryl Manges  Chart reviewed as part of pre-operative protocol coverage. Given past medical history and time since last visit, based on ACC/AHA guidelines, Monique Underwood would be at acceptable risk for the planned procedure without further cardiovascular testing.   Patient was seen by cardiology service 2 weeks ago at which time she was doing well.  In the past 2 weeks, she has not had any significant palpitation or feeling of passing out.  She is of acceptable risk to proceed with GYN procedure from the cardiac perspective.  The patient was advised that if she develops new symptoms prior to surgery to contact our office to arrange for a follow-up visit, and she verbalized understanding.  I will route this recommendation to the requesting party via Epic fax function and remove from pre-op pool.  Please call with questions.  Melissa, [place] 02/23/2022, 1:33 PM

## 2022-02-23 NOTE — H&P (Signed)
Monique Underwood is an 26 y.o. female. 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 She failed cytotec administration - passed clots/tissue but repeat US demonstrated persistent IUGS and baby measuring 6.1 wga without a heartbeat. She received Rhogam on 02/08/22. She was cleared by cardiology for this procedure.   Past Medical History:  Diagnosis Date   Anxiety    EDS (Ehlers-Danlos syndrome)    EDS 3   History of chlamydia infection 01/04/2017   Migraine with aura    Ovarian cyst    POTS (postural orthostatic tachycardia syndrome)    Rotavirus infection 1/25 - 07/22/02   Hospitalized   Syncope     Past Surgical History:  Procedure Laterality Date   DILATION AND EVACUATION N/A 04/18/2016   Procedure: DILATATION AND EVACUATION;  Surgeon: Hildred Laser, MD;  Location: [facility] ORS;  Service: Gynecology;  Laterality: N/A;   EEG  7/99   NML   Febrile seizure  7/99   [facility] - w/u negative   MRI - head     NML    Family History  Problem Relation Age of Onset   Thyroid disease Mother    Ehlers-Danlos syndrome Father    Cancer Other    Diabetes Paternal Grandmother    Breast cancer Paternal Grandmother    Dementia Maternal Grandmother    Ovarian cancer Neg Hx    Colon cancer Neg Hx     Social History:  reports that she has been smoking e-cigarettes. She has a 1.50 pack-year smoking history. She has never used smokeless tobacco. She reports current alcohol use. She reports that she does not use drugs.  Allergies:  Allergies  Allergen Reactions   Lexapro [Escitalopram Oxalate] Other (See Comments)    GI upset.     No medications prior to admission.    Review of Systems Per HPI  There were no vitals taken for this visit. Physical Exam Gen: well appearing, NAD CV: Reg rate Pulm: NWOB Abd: soft, nondistended, nontender, no mases GYN: uterus 7 week size, no adnexa ttp/CMT Ext: No edema b/l   No results found for this or any previous visit (from the past 24  hour(s)).  No results found.  Assessment/Plan: 26 yo Pt counseled on TVUS results and diagnosis of MAB. Discussed that it is unlikely to be her fault nor could she have prevented it. Reviewed that this miscarriage does not likely reflect her ability to have a successful pregnancy in the future, and that miscarriage is common - 1:5 pregnancies. She was counseled on options for managing missed ab including expectant, medical, and surgical management.  Patient desires definitive surgical management with suction dilation and curettage. Will do under US guidance and with genetic testing.   Risks/benefits/ and alternatives reviewed with patient with risks including but not limited to bleeding, infection, uterine perforation, and damage to nearby structures such as the bowel, bladder, vessels, and/or other organs. She was given opportunity to ask questions and all questions answered. Patient consents to proceed with suction dilation and curettage. Reviewed bleeding precautions. Her blood type is RH negative and she does not need Rhogam because she received Rhogam in the office 2 weeks ago. Given instructions and she verbalizes understanding. Discussed the importance of having at least one normal periods after completion of the process, before attempting conception again. Discussed S/S to call back for. All questions answered and pt verbalizes understanding w/out further questions/concerns. Risks discussed including infection, bleeding, damage to surrounding structures, need for additional procedures, postoperative DVT  and subsequent scarring. All questions answered. Consent signed in office.  Plan on doxycycline postop and consider cytotec x 3 days which was given in the office.    Madelaine Etienne Hart Haas 02/23/2022, 4:00 PM

## 2022-02-24 ENCOUNTER — Encounter (HOSPITAL_COMMUNITY): Payer: Self-pay | Admitting: Obstetrics and Gynecology

## 2022-02-26 NOTE — Pre-Procedure Instructions (Signed)
PCP -  Cardiologist - Dr. Clide Cliff  EKG - 02/08/22 Chest x-ray - n/a ECHO - n/a Cardiac Cath - n/a CPAP - n/a  Blood Thinner Instructions: n/a Aspirin Instructions: n/a ERAS Protcol - Yes COVID TEST- n/a  Anesthesia review: Yes  -------------  SDW INSTRUCTIONS:  Your procedure is scheduled on 03/01/22. Please report to [facility] Main Entrance "A" at 0600 A.M., and check in at the Admitting office. Call this number if you have problems the morning of surgery: [phone]   Remember: Do not eat after midnight the night before your surgery  You may drink clear liquids until 0530 the morning of your surgery.   Clear liquids allowed are: Water, Non-Citrus Juices (without pulp), Carbonated Beverages, Clear Tea, Black Coffee Only, and Gatorade   Medications to take morning of surgery with a sip of water include: N/a  As of today, STOP taking any Aspirin (unless otherwise instructed by your surgeon), Aleve, Naproxen, Ibuprofen, Motrin, Advil, Goody's, BC's, all herbal medications, fish oil, and all vitamins.    The Morning of Surgery Do not wear jewelry, make-up or nail polish. Do not wear lotions, powders, or perfumes/colognes, or deodorant Do not bring valuables to the hospital. [facility] is not responsible for any belongings or valuables.  If you are a smoker, DO NOT Smoke 24 hours prior to surgery  If you wear a CPAP at night please bring your mask the morning of surgery   Remember that you must have someone to transport you home after your surgery, and remain with you for 24 hours if you are discharged the same day.  Please bring cases for contacts, glasses, hearing aids, dentures or bridgework because it cannot be worn into surgery.   Patients discharged the day of surgery will not be allowed to drive home.   Please shower the NIGHT BEFORE/MORNING OF SURGERY (use antibacterial soap like DIAL soap if possible). Wear comfortable clothes the morning of surgery. Oral Hygiene  is also important to reduce your risk of infection.  Remember - BRUSH YOUR TEETH THE MORNING OF SURGERY WITH YOUR REGULAR TOOTHPASTE  Patient denies shortness of breath, fever, cough and chest pain.

## 2022-02-28 NOTE — Anesthesia Preprocedure Evaluation (Addendum)
Anesthesia Evaluation  Patient identified by MRN, date of birth, ID band Patient awake    Reviewed: Allergy & Precautions, NPO status , Patient's Chart, lab work & pertinent test results  Airway Mallampati: I  TM Distance: >3 FB Neck ROM: Full    Dental  (+) Teeth Intact, Dental Advisory Given   Pulmonary Current Smoker,    breath sounds clear to auscultation       Cardiovascular negative cardio ROS   Rhythm:Regular Rate:Normal     Neuro/Psych  Headaches, Seizures -,  PSYCHIATRIC DISORDERS Anxiety Depression    GI/Hepatic Neg liver ROS, GERD  ,  Endo/Other  negative endocrine ROS  Renal/GU negative Renal ROS     Musculoskeletal negative musculoskeletal ROS (+)   Abdominal Normal abdominal exam  (+)   Peds  Hematology   Anesthesia Other Findings   Reproductive/Obstetrics                           Anesthesia Physical Anesthesia Plan  ASA: 2  Anesthesia Plan: General   Post-op Pain Management:    Induction: Intravenous  PONV Risk Score and Plan: 3 and Ondansetron, Dexamethasone and Midazolam  Airway Management Planned: LMA  Additional Equipment: None  Intra-op Plan:   Post-operative Plan: Extubation in OR  Informed Consent: I have reviewed the patients History and Physical, chart, labs and discussed the procedure including the risks, benefits and alternatives for the proposed anesthesia with the patient or authorized representative who has indicated his/her understanding and acceptance.     Dental advisory given  Plan Discussed with: CRNA  Anesthesia Plan Comments: (PAT note written 02/28/2022 by Shonna Chock, PA-C. )      Anesthesia Quick Evaluation

## 2022-02-28 NOTE — Progress Notes (Signed)
Anesthesia Chart Review: Monique Underwood   Case: [id number] Date/Time: 03/01/22 0815   Procedures:      DILATATION AND EVACUATION     OPERATIVE ULTRASOUND   Anesthesia type: Choice   Pre-op diagnosis: MISSED ABORTION   [facility] / [facility]   Surgeons: Ranae Pila, MD       DISCUSSION: Per notes by Dr. Elon Spanner, "Monique Underwood is an 26 y.o. female. 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 She failed cytotec administration - passed clots/tissue but repeat US demonstrated persistent IUGS and baby measuring 6.1 wga without a heartbeat. She received Rhogam on 02/08/22. She was cleared by cardiology for this procedure."  History includes smoking, Ehlers-Danlos syndrome, POTS, syncope, Raynaud's phenomenon, GERD, anemia, depression, febrile seizure (1999).   Cardiology preoperative input outlined on 02/23/22 by Azalee Course, PA, "Given past medical history and time since last visit, based on ACC/AHA guidelines, Monique Underwood would be at acceptable risk for the planned procedure without further cardiovascular testing.    Patient was seen by cardiology service 2 weeks ago at which time she was doing well.  In the past 2 weeks, she has not had any significant palpitation or feeling of passing out.  She is of acceptable risk to proceed with GYN procedure from the cardiac perspective."  She uses as needed b-blocker 1-2 times every several times a month for tachypalpitations.  Labs as indicated, and anesthesia team to evaluate on the day of surgery.    PROVIDERS: Joaquim Nam, MD is PCP  Sherryl Manges, MD is EP   LABS: For day of surgery as indicated.   EKG: 02/08/22: NSR   CV: Echo 04/23/15: Study Conclusions  - Left ventricle: The cavity size was normal. Systolic function was    normal. The estimated ejection fraction was in the range of 60%    to 65%. Wall motion was normal; there were no regional wall    motion abnormalities. Left ventricular diastolic  function    parameters were normal.  - Mitral valve: There was mild regurgitation.  - Left atrium: The atrium was normal in size.  - Right ventricle: Systolic function was normal.  - Pulmonary arteries: Systolic pressure was within the normal    range.  Impressions:  - Normal study.    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 Normal echo and no aortic dilation on 2016 chest CT. Managed with as needed b-blocker.  Past Medical History:  Diagnosis Date   Anemia    Anxiety    Arthralgia 2020   Depression    Ehlers-Danlos syndrome 2021   GERD (gastroesophageal reflux disease)    History of chlamydia infection 01/04/2017   Migraine with aura    Ovarian cyst    POTS (postural orthostatic tachycardia syndrome) 2021   Raynaud's phenomenon without gangrene 2020   Rotavirus infection 1/25 - 07/22/02   Hospitalized   Syncope     Past Surgical History:  Procedure Laterality Date   DILATION AND EVACUATION N/A 04/18/2016   Procedure: DILATATION AND EVACUATION;  Surgeon: Hildred Laser, MD;  Location: [facility] ORS;  Service: Gynecology;  Laterality: N/A;   EEG  7/99   NML   Febrile seizure  7/99   [facility] - w/u negative   MRI - head     NML    MEDICATIONS:  No current facility-administered medications for this encounter.    hydrocortisone (ANUSOL-HC) 2.5 % rectal cream   hydrocortisone 2.5 % cream   lidocaine (XYLOCAINE) 2 % jelly    Shonna Chock, PA-C Surgical Short Stay/Anesthesiology [facility] Phone [phone] [facility] Phone [phone] 02/28/2022 2:06 PM

## 2022-03-01 ENCOUNTER — Ambulatory Visit (HOSPITAL_COMMUNITY): Payer: No Typology Code available for payment source | Admitting: Vascular Surgery

## 2022-03-01 ENCOUNTER — Other Ambulatory Visit: Payer: Self-pay

## 2022-03-01 ENCOUNTER — Ambulatory Visit (HOSPITAL_COMMUNITY)
Admission: RE | Admit: 2022-03-01 | Discharge: 2022-03-01 | Disposition: A | Discharge status: Home / Self Care | Payer: No Typology Code available for payment source | Source: Ambulatory Visit | Attending: Obstetrics and Gynecology | Admitting: Obstetrics and Gynecology

## 2022-03-01 ENCOUNTER — Ambulatory Visit (HOSPITAL_COMMUNITY)
Admission: RE | Admit: 2022-03-01 | Discharge: 2022-03-01 | Disposition: A | Discharge status: Home / Self Care | Payer: No Typology Code available for payment source | Attending: Obstetrics and Gynecology | Admitting: Obstetrics and Gynecology

## 2022-03-01 ENCOUNTER — Ambulatory Visit (HOSPITAL_BASED_OUTPATIENT_CLINIC_OR_DEPARTMENT_OTHER): Payer: No Typology Code available for payment source | Admitting: Vascular Surgery

## 2022-03-01 ENCOUNTER — Encounter (HOSPITAL_COMMUNITY)
Admission: RE | Disposition: A | Discharge status: Home / Self Care | Payer: Self-pay | Source: Home / Self Care | Attending: Obstetrics and Gynecology

## 2022-03-01 ENCOUNTER — Encounter (HOSPITAL_COMMUNITY): Payer: Self-pay | Admitting: Obstetrics and Gynecology

## 2022-03-01 DIAGNOSIS — Z8669 Personal history of other diseases of the nervous system and sense organs: Secondary | ICD-10-CM | POA: Insufficient documentation

## 2022-03-01 DIAGNOSIS — O021 Missed abortion: Secondary | ICD-10-CM | POA: Diagnosis present

## 2022-03-01 DIAGNOSIS — K219 Gastro-esophageal reflux disease without esophagitis: Secondary | ICD-10-CM | POA: Diagnosis not present

## 2022-03-01 DIAGNOSIS — F17290 Nicotine dependence, other tobacco product, uncomplicated: Secondary | ICD-10-CM | POA: Diagnosis not present

## 2022-03-01 HISTORY — DX: Anemia, unspecified: D64.9

## 2022-03-01 HISTORY — DX: Gastro-esophageal reflux disease without esophagitis: K21.9

## 2022-03-01 HISTORY — PX: OPERATIVE ULTRASOUND: SHX5996

## 2022-03-01 HISTORY — DX: Depression, unspecified: F32.A

## 2022-03-01 HISTORY — PX: DILATION AND EVACUATION: SHX1459

## 2022-03-01 LAB — CBC
HCT: 40 % (ref 36.0–46.0)
Hemoglobin: 13.7 g/dL (ref 12.0–15.0)
MCH: 31.3 pg (ref 26.0–34.0)
MCHC: 34.3 g/dL (ref 30.0–36.0)
MCV: 91.3 fL (ref 80.0–100.0)
Platelets: 255 10*3/uL (ref 150–400)
RBC: 4.38 MIL/uL (ref 3.87–5.11)
RDW: 12.4 % (ref 11.5–15.5)
WBC: 7.8 10*3/uL (ref 4.0–10.5)
nRBC: 0 % (ref 0.0–0.2)

## 2022-03-01 LAB — TYPE AND SCREEN
ABO/RH(D): O NEG
Antibody Screen: POSITIVE

## 2022-03-01 LAB — ABO/RH: ABO/RH(D): O NEG

## 2022-03-01 SURGERY — DILATION AND EVACUATION, UTERUS
Anesthesia: General

## 2022-03-01 MED ORDER — OXYCODONE HCL 5 MG PO TABS: 5.0000 mg | ORAL_TABLET | Freq: Once | ORAL | Status: DC | PRN

## 2022-03-01 MED ORDER — ACETAMINOPHEN 325 MG PO TABS: 325.0000 mg | ORAL_TABLET | ORAL | Status: DC | PRN

## 2022-03-01 MED ORDER — FENTANYL CITRATE (PF) 100 MCG/2ML IJ SOLN: 25.0000 ug | INTRAMUSCULAR | Status: DC | PRN

## 2022-03-01 MED ORDER — POVIDONE-IODINE 10 % EX SWAB
2.0000 | Freq: Once | CUTANEOUS | Status: AC
  Administered 2022-03-01: 2 via TOPICAL

## 2022-03-01 MED ORDER — METHYLERGONOVINE MALEATE 0.2 MG/ML IJ SOLN
INTRAMUSCULAR | Status: AC
  Filled 2022-03-01: qty 1

## 2022-03-01 MED ORDER — ACETAMINOPHEN 10 MG/ML IV SOLN: 1000.0000 mg | Freq: Once | INTRAVENOUS | Status: DC | PRN

## 2022-03-01 MED ORDER — SCOPOLAMINE 1 MG/3DAYS TD PT72
MEDICATED_PATCH | TRANSDERMAL | Status: DC | PRN
  Administered 2022-03-01: 1 via TRANSDERMAL

## 2022-03-01 MED ORDER — FENTANYL CITRATE (PF) 250 MCG/5ML IJ SOLN
INTRAMUSCULAR | Status: AC
  Filled 2022-03-01: qty 5

## 2022-03-01 MED ORDER — DEXAMETHASONE SODIUM PHOSPHATE 10 MG/ML IJ SOLN
INTRAMUSCULAR | Status: AC
  Filled 2022-03-01: qty 1

## 2022-03-01 MED ORDER — KETOROLAC TROMETHAMINE 30 MG/ML IJ SOLN
INTRAMUSCULAR | Status: AC
  Filled 2022-03-01: qty 1

## 2022-03-01 MED ORDER — LACTATED RINGERS IV SOLN: INTRAVENOUS | Status: DC

## 2022-03-01 MED ORDER — PROPOFOL 10 MG/ML IV BOLUS
INTRAVENOUS | Status: DC | PRN
  Administered 2022-03-01: 150 mg via INTRAVENOUS

## 2022-03-01 MED ORDER — CHLORHEXIDINE GLUCONATE 0.12 % MT SOLN: 15.0000 mL | Freq: Once | OROMUCOSAL | Status: AC

## 2022-03-01 MED ORDER — DIPHENHYDRAMINE HCL 50 MG/ML IJ SOLN
INTRAMUSCULAR | Status: DC | PRN
  Administered 2022-03-01: 6.25 mg via INTRAVENOUS

## 2022-03-01 MED ORDER — ORAL CARE MOUTH RINSE: 15.0000 mL | Freq: Once | OROMUCOSAL | Status: AC

## 2022-03-01 MED ORDER — OXYCODONE HCL 5 MG/5ML PO SOLN: 5.0000 mg | Freq: Once | ORAL | Status: DC | PRN

## 2022-03-01 MED ORDER — MIDAZOLAM HCL 2 MG/2ML IJ SOLN
INTRAMUSCULAR | Status: DC | PRN
  Administered 2022-03-01: 2 mg via INTRAVENOUS

## 2022-03-01 MED ORDER — MIDAZOLAM HCL 2 MG/2ML IJ SOLN
INTRAMUSCULAR | Status: AC
  Filled 2022-03-01: qty 2

## 2022-03-01 MED ORDER — DIPHENHYDRAMINE HCL 50 MG/ML IJ SOLN
INTRAMUSCULAR | Status: AC
Start: 2022-03-01 — End: ?
  Filled 2022-03-01: qty 1

## 2022-03-01 MED ORDER — ONDANSETRON HCL 4 MG/2ML IJ SOLN
INTRAMUSCULAR | Status: DC | PRN
  Administered 2022-03-01: 4 mg via INTRAVENOUS

## 2022-03-01 MED ORDER — ONDANSETRON HCL 4 MG/2ML IJ SOLN
INTRAMUSCULAR | Status: AC
  Filled 2022-03-01: qty 2

## 2022-03-01 MED ORDER — CHLOROPROCAINE HCL 1 % IJ SOLN
INTRAMUSCULAR | Status: DC | PRN
  Administered 2022-03-01: 7 mL

## 2022-03-01 MED ORDER — FENTANYL CITRATE (PF) 250 MCG/5ML IJ SOLN
INTRAMUSCULAR | Status: DC | PRN
  Administered 2022-03-01 (×2): 25 ug via INTRAVENOUS

## 2022-03-01 MED ORDER — LIDOCAINE 2% (20 MG/ML) 5 ML SYRINGE
INTRAMUSCULAR | Status: AC
  Filled 2022-03-01: qty 5

## 2022-03-01 MED ORDER — AMISULPRIDE (ANTIEMETIC) 5 MG/2ML IV SOLN
10.0000 mg | Freq: Once | INTRAVENOUS | Status: DC | PRN
Start: 2022-03-01 — End: 2022-03-01

## 2022-03-01 MED ORDER — CHLOROPROCAINE HCL 1 % IJ SOLN
INTRAMUSCULAR | Status: AC
  Filled 2022-03-01: qty 30

## 2022-03-01 MED ORDER — CHLORHEXIDINE GLUCONATE 0.12 % MT SOLN
OROMUCOSAL | Status: AC
  Administered 2022-03-01: 15 mL via OROMUCOSAL
  Filled 2022-03-01: qty 15

## 2022-03-01 MED ORDER — LIDOCAINE 2% (20 MG/ML) 5 ML SYRINGE
INTRAMUSCULAR | Status: DC | PRN
  Administered 2022-03-01: 40 mg via INTRAVENOUS

## 2022-03-01 MED ORDER — SILVER NITRATE-POT NITRATE 75-25 % EX MISC
CUTANEOUS | Status: AC
  Filled 2022-03-01: qty 10

## 2022-03-01 MED ORDER — DEXAMETHASONE SODIUM PHOSPHATE 10 MG/ML IJ SOLN
INTRAMUSCULAR | Status: DC | PRN
  Administered 2022-03-01: 5 mg via INTRAVENOUS

## 2022-03-01 MED ORDER — PROMETHAZINE HCL 25 MG/ML IJ SOLN: 6.2500 mg | INTRAMUSCULAR | Status: DC | PRN

## 2022-03-01 MED ORDER — PROPOFOL 10 MG/ML IV BOLUS
INTRAVENOUS | Status: AC
  Filled 2022-03-01: qty 20

## 2022-03-01 MED ORDER — KETOROLAC TROMETHAMINE 30 MG/ML IJ SOLN
INTRAMUSCULAR | Status: DC | PRN
  Administered 2022-03-01: 30 mg via INTRAVENOUS

## 2022-03-01 MED ORDER — ACETAMINOPHEN 160 MG/5ML PO SOLN: 325.0000 mg | ORAL | Status: DC | PRN

## 2022-03-01 MED ORDER — ARTIFICIAL TEARS OPHTHALMIC OINT
TOPICAL_OINTMENT | OPHTHALMIC | Status: AC
  Filled 2022-03-01: qty 3.5

## 2022-03-01 SURGICAL SUPPLY — 22 items
CATH ROBINSON RED A/P 16FR (CATHETERS) ×2 IMPLANT
DRSG TELFA 3X8 NADH (GAUZE/BANDAGES/DRESSINGS) ×1 IMPLANT
FILTER UTR ASPR ASSEMBLY (MISCELLANEOUS) ×2 IMPLANT
GLOVE BIO SURGEON STRL SZ 6.5 (GLOVE) ×2 IMPLANT
GLOVE BIOGEL PI IND STRL 7.0 (GLOVE) ×4 IMPLANT
GOWN STRL REUS W/ TWL LRG LVL3 (GOWN DISPOSABLE) ×4 IMPLANT
GOWN STRL REUS W/TWL LRG LVL3 (GOWN DISPOSABLE) ×2
HOSE CONNECTING 18IN BERKELEY (TUBING) ×2 IMPLANT
KIT BERKELEY 1ST TRI 3/8 NO TR (MISCELLANEOUS) ×2 IMPLANT
KIT BERKELEY 1ST TRIMESTER 3/8 (MISCELLANEOUS) ×2 IMPLANT
NS IRRIG 1000ML POUR BTL (IV SOLUTION) ×2 IMPLANT
PACK VAGINAL MINOR WOMEN LF (CUSTOM PROCEDURE TRAY) ×2 IMPLANT
PAD DRESSING TELFA 3X8 NADH (GAUZE/BANDAGES/DRESSINGS) ×2 IMPLANT
PAD OB MATERNITY 4.3X12.25 (PERSONAL CARE ITEMS) ×2 IMPLANT
SET BERKELEY SUCTION TUBING (SUCTIONS) ×2 IMPLANT
SPIKE FLUID TRANSFER (MISCELLANEOUS) ×2 IMPLANT
TOWEL GREEN STERILE FF (TOWEL DISPOSABLE) ×4 IMPLANT
UNDERPAD 30X36 HEAVY ABSORB (UNDERPADS AND DIAPERS) ×2 IMPLANT
VACURETTE 10 RIGID CVD (CANNULA) IMPLANT
VACURETTE 7MM CVD STRL WRAP (CANNULA) IMPLANT
VACURETTE 8 RIGID CVD (CANNULA) IMPLANT
VACURETTE 9 RIGID CVD (CANNULA) IMPLANT

## 2022-03-01 NOTE — Anesthesia Procedure Notes (Signed)
Procedure Name: LMA Insertion Date/Time: 03/01/2022 8:42 AM  Performed by: Mayer Camel, CRNAPre-anesthesia Checklist: Patient identified, Emergency Drugs available, Suction available and Patient being monitored Patient Re-evaluated:Patient Re-evaluated prior to induction Oxygen Delivery Method: Circle System Utilized Preoxygenation: Pre-oxygenation with 100% oxygen Induction Type: IV induction Ventilation: Mask ventilation without difficulty LMA: LMA inserted LMA Size: 4.0 Number of attempts: 1 Airway Equipment and Method: Bite block Placement Confirmation: positive ETCO2 Tube secured with: Tape Dental Injury: Teeth and Oropharynx as per pre-operative assessment

## 2022-03-01 NOTE — Anesthesia Postprocedure Evaluation (Signed)
Anesthesia Post Note  Patient: Monique Underwood  Procedure(s) Performed: DILATATION AND EVACUATION OPERATIVE ULTRASOUND     Patient location during evaluation: PACU Anesthesia Type: General Level of consciousness: awake and alert Pain management: pain level controlled Vital Signs Assessment: post-procedure vital signs reviewed and stable Respiratory status: spontaneous breathing, nonlabored ventilation, respiratory function stable and patient connected to nasal cannula oxygen Cardiovascular status: blood pressure returned to baseline and stable Postop Assessment: no apparent nausea or vomiting Anesthetic complications: no   No notable events documented.  Last Vitals:  Vitals:   03/01/22 0915 03/01/22 0930  BP: 104/71 103/74  Pulse: 86 66  Resp: 11 14  Temp: 36.5 C 36.6 C  SpO2: 100% 100%    Last Pain:  Vitals:   03/01/22 0915  TempSrc:   PainSc: 0-No pain                 Shelton Silvas

## 2022-03-01 NOTE — Op Note (Signed)
PREOPERATIVE DIAGNOSES: 1. Missed Abortion in 1st trimester  POSTOPERATIVE DIAGNOSES: Same  PROCEDURE PERFORMED: Dilation, suction, sharp curretage  SURGEON: Dr. Belva Agee  ANESTHESIA: Paracervical block and IV sedation  ESTIMATED BLOOD LOSS: 50 cc.  COMPLICATIONS: None  TUBES: None.  DRAINS: None  PATHOLOGY: Endometrial curettings for chromosomal analysis   FINDINGS: On exam, under anesthesia, normal appearing vulva and vagina, 9 week sized uterus  Operative findings demonstrated plethora of POCs  Procedure: The patient was taken to the operating room where she was properly prepped and draped in sterile manner under general anesthesia. After bimanual examination, the cervix was exposed with a speculum and the anterior lip of the cervix grasped with a tenaculum. Paracervical block performed. The endocervical canal was then progressively dilated to 27mm. Suction catheter was introduced into the uterus and to the uterine fundus. The uterus was evacuated and good tissue return was noted. A sharp curettage was then performed until gritty texture noted. All of this was done under US guidance under direct visualization. Uterine cavity empty at end of procedure,  All instruments were removed from vagina. The sponge and lap counts were correct times 2 at this time. The patient's procedure was terminated. We then awakened her. She was sent to the Recovery Room in good condition.    Belva Agee MD

## 2022-03-01 NOTE — Transfer of Care (Signed)
Immediate Anesthesia Transfer of Care Note  Patient: Erlinda Hong  Procedure(s) Performed: DILATATION AND EVACUATION OPERATIVE ULTRASOUND  Patient Location: PACU  Anesthesia Type:General  Level of Consciousness: awake, alert  and oriented  Airway & Oxygen Therapy: Patient Spontanous Breathing and Patient connected to face mask oxygen  Post-op Assessment: Report given to RN and Post -op Vital signs reviewed and stable  Post vital signs: Reviewed and stable  Last Vitals:  Vitals Value Taken Time  BP 104/71 03/01/22 0915  Temp 36.5 C 03/01/22 0915  Pulse 80 03/01/22 0922  Resp 14 03/01/22 0922  SpO2 100 % 03/01/22 0922  Vitals shown include unvalidated device data.  Last Pain:  Vitals:   03/01/22 0915  TempSrc:   PainSc: 0-No pain         Complications: No notable events documented.

## 2022-03-01 NOTE — Progress Notes (Signed)
No updates to above H&P. Patient arrived NPO and was consented in PACU. Risks again discussed, all questions answered, and consent signed. Proceed with above surgery.    Shaela Boer MD  

## 2022-03-02 ENCOUNTER — Encounter (HOSPITAL_COMMUNITY): Payer: Self-pay | Admitting: Obstetrics and Gynecology

## 2022-03-02 LAB — SURGICAL PATHOLOGY

## 2022-05-16 ENCOUNTER — Ambulatory Visit: Payer: No Typology Code available for payment source | Admitting: Internal Medicine

## 2022-05-16 DIAGNOSIS — G901 Familial dysautonomia [Riley-Day]: Secondary | ICD-10-CM | POA: Insufficient documentation

## 2022-06-22 DIAGNOSIS — N96 Recurrent pregnancy loss: Secondary | ICD-10-CM | POA: Insufficient documentation

## 2022-06-29 ENCOUNTER — Other Ambulatory Visit (HOSPITAL_COMMUNITY): Payer: Self-pay

## 2022-06-29 MED ORDER — HEPARIN SODIUM (PORCINE) 5000 UNIT/ML IJ SOLN
5000.0000 [IU] | Freq: Two times a day (BID) | INTRAMUSCULAR | 6 refills | Status: DC
  Filled 2022-06-29: qty 60, 30d supply, fill #0
  Filled 2022-07-30: qty 60, 30d supply, fill #1
  Filled 2022-08-30: qty 60, 30d supply, fill #2

## 2022-06-29 MED ORDER — "INSULIN SYRINGE-NEEDLE U-100 30G X 1/2"" 1 ML MISC"
1.0000 | Freq: Two times a day (BID) | 6 refills | Status: DC
  Filled 2022-06-29: qty 60, 30d supply, fill #0
  Filled 2022-06-29: qty 100, 50d supply, fill #0
  Filled 2022-07-30 – 2022-09-07 (×8): qty 60, 30d supply, fill #0

## 2022-07-31 ENCOUNTER — Other Ambulatory Visit (HOSPITAL_COMMUNITY): Payer: Self-pay

## 2022-08-02 ENCOUNTER — Emergency Department (HOSPITAL_COMMUNITY)
Admission: EM | Admit: 2022-08-02 | Discharge: 2022-08-02 | Disposition: A | Discharge status: Home / Self Care | Payer: No Typology Code available for payment source | Attending: Emergency Medicine | Admitting: Emergency Medicine

## 2022-08-02 ENCOUNTER — Emergency Department (HOSPITAL_COMMUNITY): Payer: No Typology Code available for payment source

## 2022-08-02 DIAGNOSIS — O9A211 Injury, poisoning and certain other consequences of external causes complicating pregnancy, first trimester: Secondary | ICD-10-CM | POA: Diagnosis not present

## 2022-08-02 DIAGNOSIS — W01198A Fall on same level from slipping, tripping and stumbling with subsequent striking against other object, initial encounter: Secondary | ICD-10-CM | POA: Diagnosis not present

## 2022-08-02 DIAGNOSIS — S0081XA Abrasion of other part of head, initial encounter: Secondary | ICD-10-CM | POA: Insufficient documentation

## 2022-08-02 DIAGNOSIS — Y93G1 Activity, food preparation and clean up: Secondary | ICD-10-CM | POA: Insufficient documentation

## 2022-08-02 DIAGNOSIS — W19XXXA Unspecified fall, initial encounter: Secondary | ICD-10-CM

## 2022-08-02 DIAGNOSIS — Y92000 Kitchen of unspecified non-institutional (private) residence as  the place of occurrence of the external cause: Secondary | ICD-10-CM | POA: Diagnosis not present

## 2022-08-02 NOTE — ED Provider Notes (Signed)
[facility] Provider Note   CSN: [id number] Arrival date & time: 08/02/22  1823     History Chief Complaint  Patient presents with   Fall    HPI AZIZI BALLY is a 27 y.o. female presenting for chief complaint of fall.  She is a 27 year old female with Ehlers-Danlos syndrome, POTS, history of 2X miscarriage secondary to thromboembolic disease of pregnancy who is currently a G3, P0.  She had a ground-level fall slipping on water in the kitchen.  She has an abrasion to her left forehead.  Hematoma forming underneath it.  She denies fevers or chills, nausea vomiting, syncope shortness of breath.  Otherwise ambulatory tolerating p.o. intake.  Some residual headache since the fall. Patient's recorded medical, surgical, social, medication list and allergies were reviewed in the Snapshot window as part of the initial history.   Review of Systems   Review of Systems  Constitutional:  Negative for chills and fever.  HENT:  Negative for ear pain and sore throat.   Eyes:  Negative for pain and visual disturbance.  Respiratory:  Negative for cough and shortness of breath.   Cardiovascular:  Negative for chest pain and palpitations.  Gastrointestinal:  Negative for abdominal pain and vomiting.  Genitourinary:  Negative for dysuria and hematuria.  Musculoskeletal:  Negative for arthralgias and back pain.  Skin:  Negative for color change and rash.  Neurological:  Positive for headaches. Negative for seizures and syncope.  All other systems reviewed and are negative.   Physical Exam Updated Vital Signs BP 112/66   Pulse 86   Temp 97.8 F (36.6 C) (Oral)   Resp 18   SpO2 100%  Physical Exam Vitals and nursing note reviewed.  Constitutional:      General: She is not in acute distress.    Appearance: She is well-developed.  HENT:     Head: Normocephalic and atraumatic.  Eyes:     Conjunctiva/sclera: Conjunctivae normal.  Cardiovascular:      Rate and Rhythm: Normal rate and regular rhythm.     Heart sounds: No murmur heard. Pulmonary:     Effort: Pulmonary effort is normal. No respiratory distress.     Breath sounds: Normal breath sounds.  Abdominal:     General: There is no distension.     Palpations: Abdomen is soft.     Tenderness: There is no abdominal tenderness. There is no right CVA tenderness or left CVA tenderness.  Musculoskeletal:        General: Signs of injury (Abrasion to left forehead.  No laceration through the dermis) present. No swelling or tenderness. Normal range of motion.     Cervical back: Neck supple.  Skin:    General: Skin is warm and dry.  Neurological:     General: No focal deficit present.     Mental Status: She is alert and oriented to person, place, and time. Mental status is at baseline.     Cranial Nerves: No cranial nerve deficit.      ED Course/ Medical Decision Making/ A&P    Procedures Procedures   Medications Ordered in ED Medications - No data to display Medical Decision Making:    EULALA NEWCOMBE is a 27 y.o. female who presented to the ED today with a moderate mechanisma trauma, detailed above.    Additional history discussed with patient's family/caregivers.  Patient's presentation is complicated by their history of chronic anticoagulation.  Patient placed on continuous vitals and  telemetry monitoring while in ED which was reviewed periodically.   Given this mechanism of trauma, a full physical exam was performed. Notably, patient was hemodynamically stable no acute distress.   Reviewed and confirmed nursing documentation for past medical history, family history, social history.    Initial Assessment/Plan:   This is a patient presenting with a moderate mechanism trauma.  As such, I have considered intracranial injuries including intracranial hemorrhage, intrathoracic injuries including blunt myocardial or blunt lung injury, blunt abdominal injuries including  aortic dissection, bladder injury, spleen injury, liver injury and I have considered orthopedic injuries including extremity or spinal injury.  With the patient's presentation of moderate mechanism trauma but an otherwise reassuring exam, patient warrants targeted evaluation for potential traumatic injuries. Will proceed with targeted evaluation for potential injuries. Will proceed with CT head to evaluate for intracranial bleeding given blood thinner utilization. Objective evaluation resulted with no acute pathology.   Disposition:  I have considered need for hospitalization, however, considering all of the above, I believe this patient is stable for discharge at this time.  Patient/family educated about specific return precautions for given chief complaint and symptoms.  Patient/family educated about follow-up with PCP.     Patient/family expressed understanding of return precautions and need for follow-up. Patient spoken to regarding all imaging and laboratory results and appropriate follow up for these results. All education provided in verbal form with additional information in written form. Time was allowed for answering of patient questions. Patient discharged.    Emergency Department Medication Summary:   Medications - No data to display        Clinical Impression:  1. Fall, initial encounter      Discharge   Final Clinical Impression(s) / ED Diagnoses Final diagnoses:  Fall, initial encounter    Rx / DC Orders ED Discharge Orders     None         Tretha Sciara, MD 08/02/22 2044

## 2022-08-02 NOTE — ED Triage Notes (Signed)
Pt states that she was cleaning dishes and slipped on the floor, striking the L side of her face on the ground. Pt denies LOC. Is anticoagulated on SQ heparin for pregnancy, 9 wks followed by OB. G3P0

## 2022-08-02 NOTE — ED Notes (Signed)
Patient ambulatory to restroom with steady gait.

## 2022-08-03 ENCOUNTER — Telehealth: Payer: Self-pay

## 2022-08-03 NOTE — Telephone Encounter (Signed)
Transition Care Management Unsuccessful Follow-up Telephone Call [place] [facility] 08-02-22 Dx: fall  Date of discharge and from where:    Attempts:  1st Attempt  Reason for unsuccessful TCM follow-up call:  Unable to leave message   Juanda Crumble LPN [place] Direct Dial [phone]

## 2022-08-07 DIAGNOSIS — Z3490 Encounter for supervision of normal pregnancy, unspecified, unspecified trimester: Secondary | ICD-10-CM | POA: Insufficient documentation

## 2022-08-07 NOTE — Transitions of Care (Post Inpatient/ED Visit) (Signed)
   08/07/2022  Name: Monique Underwood MRN: [id number] DOB: [birth year]  Today's TOC FU Call Status:    Attempted to reach the patient regarding the most recent Inpatient/ED visit.  Follow Up Plan: No further outreach attempts will be made at this time. We have been unable to contact the patient.  [place] LPN [place] Advisor Direct Dial [phone]

## 2022-08-08 LAB — OB RESULTS CONSOLE ABO/RH: RH Type: NEGATIVE

## 2022-08-08 LAB — HEPATITIS C ANTIBODY: HCV Ab: NEGATIVE

## 2022-08-08 LAB — OB RESULTS CONSOLE RUBELLA ANTIBODY, IGM: Rubella: IMMUNE

## 2022-08-08 LAB — OB RESULTS CONSOLE HEPATITIS B SURFACE ANTIGEN: Hepatitis B Surface Ag: NEGATIVE

## 2022-08-08 LAB — OB RESULTS CONSOLE RPR: RPR: NONREACTIVE

## 2022-08-08 LAB — OB RESULTS CONSOLE ANTIBODY SCREEN: Antibody Screen: NEGATIVE

## 2022-08-08 LAB — OB RESULTS CONSOLE HIV ANTIBODY (ROUTINE TESTING): HIV: NONREACTIVE

## 2022-08-11 LAB — OB RESULTS CONSOLE GC/CHLAMYDIA: Neisseria Gonorrhea: NEGATIVE

## 2022-08-24 ENCOUNTER — Other Ambulatory Visit: Payer: Self-pay | Admitting: Obstetrics and Gynecology

## 2022-08-24 ENCOUNTER — Telehealth: Payer: Self-pay

## 2022-08-24 DIAGNOSIS — Q796 Ehlers-Danlos syndrome, unspecified: Secondary | ICD-10-CM

## 2022-08-31 ENCOUNTER — Other Ambulatory Visit: Payer: Self-pay

## 2022-08-31 ENCOUNTER — Other Ambulatory Visit (HOSPITAL_COMMUNITY): Payer: Self-pay

## 2022-09-01 ENCOUNTER — Other Ambulatory Visit: Payer: Self-pay

## 2022-09-04 ENCOUNTER — Other Ambulatory Visit (HOSPITAL_COMMUNITY): Payer: Self-pay

## 2022-09-07 ENCOUNTER — Other Ambulatory Visit (HOSPITAL_COMMUNITY): Payer: Self-pay

## 2022-10-02 ENCOUNTER — Encounter: Payer: Self-pay | Admitting: *Deleted

## 2022-10-02 DIAGNOSIS — O99891 Other specified diseases and conditions complicating pregnancy: Secondary | ICD-10-CM | POA: Insufficient documentation

## 2022-10-02 DIAGNOSIS — Z6791 Unspecified blood type, Rh negative: Secondary | ICD-10-CM | POA: Insufficient documentation

## 2022-10-02 DIAGNOSIS — Z87798 Personal history of other (corrected) congenital malformations: Secondary | ICD-10-CM | POA: Insufficient documentation

## 2022-10-05 ENCOUNTER — Ambulatory Visit: Payer: No Typology Code available for payment source | Attending: Internal Medicine | Admitting: Internal Medicine

## 2022-10-05 ENCOUNTER — Encounter: Payer: Self-pay | Admitting: Internal Medicine

## 2022-10-05 VITALS — BP 111/81 | HR 97 | Ht 68.0 in | Wt 131.0 lb

## 2022-10-05 DIAGNOSIS — G901 Familial dysautonomia [Riley-Day]: Secondary | ICD-10-CM | POA: Diagnosis not present

## 2022-10-05 NOTE — Progress Notes (Signed)
Patient Care Team: Joaquim Nam, MD as PCP - General (Family Medicine) Duke Salvia, MD as PCP - Electrophysiology (Cardiology)   HPI  Monique Underwood is a 27 y.o. 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  Salt repletion is intermediate.  Fluid intake is modest. Now in the second trimester.  Less nausea.   Has been told that she has arthritis in her hands and MRI scanning apparently at orthopedics suggested arthritis in her knees.  Treated symptomatically.    Past Medical History:  Diagnosis Date   ADD (attention deficit disorder) 06/24/2012   Advance care planning 01/17/2013   Anemia    Anxiety    Anxiety state 12/11/2014   Arthralgia 2020   Arthralgia 06/11/2018   Burning with urination 01/02/2018   Chlamydia    Depression    Ehlers-Danlos syndrome 2021   GERD (gastroesophageal reflux disease)    Hemorrhoids    History of chlamydia infection 01/04/2017   Migraine with aura    Nausea 04/22/2017   Nexplanon in place 04/27/2017   Ovarian cyst    Panic attacks    POTS (postural orthostatic tachycardia syndrome) 2021   RA (rheumatoid arthritis)    Racing heart beat 12/17/2014   Raynaud's phenomenon without gangrene 2020   Rotavirus infection 1/25 - 07/22/02   Hospitalized   Screen for STD (sexually transmitted disease) 11/01/2017   SVT (supraventricular tachycardia) 01/14/2015   Syncope    Syncope 12/11/2014   Viral URI 04/22/2017   Vulvar itching 01/02/2018    Past Surgical History:  Procedure Laterality Date   DILATION AND EVACUATION N/A 04/18/2016   Procedure: DILATATION AND  EVACUATION;  Surgeon: Hildred Laser, MD;  Location: [facility] ORS;  Service: Gynecology;  Laterality: N/A;   DILATION AND EVACUATION N/A 03/01/2022   Procedure: DILATATION AND EVACUATION;  Surgeon: Ranae Pila, MD;  Location: [facility] OR;  Service: Gynecology;  Laterality: N/A;  Anora kit collected    EEG  7/99   NML   Febrile seizure  7/99   [facility] - w/u negative   MRI - head     NML   OPERATIVE ULTRASOUND N/A 03/01/2022   Procedure: OPERATIVE ULTRASOUND;  Surgeon: Ranae Pila, MD;  Location: [facility] OR;  Service: Gynecology;  Laterality: N/A;    Current Outpatient Medications  Medication Sig Dispense Refill   aspirin EC 81 MG tablet Take 81 mg by mouth daily. Swallow whole.     hydrocortisone (ANUSOL-HC) 2.5 % rectal cream Place 1 application rectally 2 (two) times daily. (Patient taking differently: Place 1 application  rectally 2 (two) times daily as needed (hemorrhoids).) 30 g 0   hydrocortisone 2.5 % cream Place 1 Application rectally 2 (two) times daily as needed (hemorrhoids).     Insulin Syringe-Needle U-100 30G X 1/2" 1 ML MISC Use 1 syringe to inject heparin into the skin 2 (two) times daily. 60 each 6   lidocaine (XYLOCAINE) 2 % jelly Apply 1 application topically as needed. (Patient taking differently: Apply 1 application  topically as needed (Hemorrhoids).) 30 mL 2   No current facility-administered medications for this visit.    Allergies  Allergen Reactions   Lexapro [Escitalopram Oxalate] Other (See Comments)    GI upset.       Review of Systems negative except from HPI and PMH  Physical Exam BP 111/81 (Patient Position: Supine)   Pulse 97   Ht 5\' 8"  (1.727 m)   Wt 131 lb (59.4 kg)   LMP 05/30/2022   SpO2 99%   BMI 19.92 kg/m  Well developed and nourished in no acute distress HENT normal Neck supple with JVP-  flat Clear Regular rate and rhythm, no murmurs or gallops No Clubbing cyanosis edema Skin-warm and dry A & Oriented  Grossly normal  sensory and motor function  ECG sinus at 84 Intervals 06/[birth year]/1936    Assessment and  Plan   Dysautonomia   Joint laxity   Ehlers-Danlos-3   Anxiety/depresson  vaping  Arthritis-poly?  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  She is exercising some.  Hydration and salt repletion were encouraged.  She will check this out also with her OB/GYN as she is now in her second trimester.  We reviewed the likely issues with related to volume expansion during the second trimester and then uterine obstructing in the third trimester.  She wants to have a C-section which I think is from POTS point of view.  Reasonable decision.  Encouraged her to decrease her vaping.  Encouraged her with salt and water intake with liquid IV for total of 2 g of sodium a day.

## 2022-10-05 NOTE — Patient Instructions (Signed)
Medication Instructions:  Your physician recommends that you continue on your current medications as directed. Please refer to the Current Medication list given to you today.  *If you need a refill on your cardiac medications before your next appointment, please call your pharmacy*   Lab Work: None ordered.  If you have labs (blood work) drawn today and your tests are completely normal, you will receive your results only by: MyChart Message (if you have MyChart) OR A paper copy in the mail If you have any lab test that is abnormal or we need to change your treatment, we will call you to review the results.   Testing/Procedures: None ordered.    Follow-Up: At [facility], you and your health needs are our priority.  As part of our continuing mission to provide you with exceptional heart care, we have created designated Provider Care Teams.  These Care Teams include your primary Cardiologist (physician) and Advanced Practice Providers (APPs -  Physician Assistants and Nurse Practitioners) who all work together to provide you with the care you need, when you need it.  We recommend signing up for the patient portal called "MyChart".  Sign up information is provided on this After Visit Summary.  MyChart is used to connect with patients for Virtual Visits (Telemedicine).  Patients are able to view lab/test results, encounter notes, upcoming appointments, etc.  Non-urgent messages can be sent to your provider as well.   To learn more about what you can do with MyChart, go to [email or web address].    Your next appointment:   8 months with Dr Klein 

## 2022-10-06 ENCOUNTER — Encounter: Payer: Self-pay | Admitting: Internal Medicine

## 2022-10-10 ENCOUNTER — Ambulatory Visit: Payer: No Typology Code available for payment source | Attending: Obstetrics and Gynecology

## 2022-10-10 ENCOUNTER — Ambulatory Visit: Payer: No Typology Code available for payment source | Admitting: *Deleted

## 2022-10-10 VITALS — BP 120/67 | HR 106

## 2022-10-10 DIAGNOSIS — Q796 Ehlers-Danlos syndrome, unspecified: Secondary | ICD-10-CM

## 2022-10-10 DIAGNOSIS — Q7962 Hypermobile Ehlers-Danlos syndrome: Secondary | ICD-10-CM

## 2022-10-10 DIAGNOSIS — O99352 Diseases of the nervous system complicating pregnancy, second trimester: Secondary | ICD-10-CM

## 2022-10-10 DIAGNOSIS — O360120 Maternal care for anti-D [Rh] antibodies, second trimester, not applicable or unspecified: Secondary | ICD-10-CM

## 2022-10-10 DIAGNOSIS — O99891 Other specified diseases and conditions complicating pregnancy: Secondary | ICD-10-CM

## 2022-10-10 DIAGNOSIS — M069 Rheumatoid arthritis, unspecified: Secondary | ICD-10-CM | POA: Diagnosis present

## 2022-10-10 DIAGNOSIS — Z3A19 19 weeks gestation of pregnancy: Secondary | ICD-10-CM

## 2022-10-10 DIAGNOSIS — G90A Postural orthostatic tachycardia syndrome (POTS): Secondary | ICD-10-CM

## 2022-12-21 ENCOUNTER — Telehealth: Payer: Self-pay | Admitting: Internal Medicine

## 2022-12-21 DIAGNOSIS — R002 Palpitations: Secondary | ICD-10-CM

## 2022-12-21 NOTE — Telephone Encounter (Signed)
Pt states that her OBGYN suggested that she has an Echo done. Pt also states that she is due to have a scheduled C- Section on 9/3 and would like to have Echo done before then is possible. Please advise

## 2022-12-22 NOTE — Telephone Encounter (Signed)
Spoke with pt and advised Dr Graciela Husbands is out of the office next week and RN will need to speak with him regarding order.  Pt verbalizes understanding and agrees with current plan.

## 2022-12-27 NOTE — Telephone Encounter (Signed)
Phone call to pt.  OK per Epic to leave detailed voicemail message.  Pt advised echo has been ordered and someone will call her to schedule this appointment.  Pt may call office at [phone] for any further questions or concerns.

## 2022-12-27 NOTE — Telephone Encounter (Signed)
Yes

## 2023-01-02 ENCOUNTER — Encounter: Payer: Self-pay | Admitting: Internal Medicine

## 2023-01-02 ENCOUNTER — Other Ambulatory Visit: Payer: Self-pay | Admitting: Internal Medicine

## 2023-01-02 DIAGNOSIS — R002 Palpitations: Secondary | ICD-10-CM

## 2023-01-02 DIAGNOSIS — Q796 Ehlers-Danlos syndrome, unspecified: Secondary | ICD-10-CM

## 2023-01-22 ENCOUNTER — Ambulatory Visit: Payer: No Typology Code available for payment source | Attending: Internal Medicine

## 2023-01-22 ENCOUNTER — Other Ambulatory Visit: Payer: Self-pay | Admitting: Internal Medicine

## 2023-01-22 DIAGNOSIS — R002 Palpitations: Secondary | ICD-10-CM

## 2023-01-22 DIAGNOSIS — Q796 Ehlers-Danlos syndrome, unspecified: Secondary | ICD-10-CM | POA: Diagnosis not present

## 2023-01-22 LAB — ECHOCARDIOGRAM COMPLETE
Area-P 1/2: 4.06 cm2
Est EF: 55
S' Lateral: 3.5 cm

## 2023-02-06 ENCOUNTER — Other Ambulatory Visit: Payer: Self-pay

## 2023-02-06 ENCOUNTER — Emergency Department
Admission: EM | Admit: 2023-02-06 | Discharge: 2023-02-06 | Disposition: A | Discharge status: Home / Self Care | Payer: No Typology Code available for payment source | Attending: Emergency Medicine | Admitting: Emergency Medicine

## 2023-02-06 ENCOUNTER — Encounter: Payer: Self-pay | Admitting: Emergency Medicine

## 2023-02-06 DIAGNOSIS — U071 COVID-19: Secondary | ICD-10-CM | POA: Insufficient documentation

## 2023-02-06 DIAGNOSIS — Z3A36 36 weeks gestation of pregnancy: Secondary | ICD-10-CM | POA: Diagnosis not present

## 2023-02-06 DIAGNOSIS — O0993 Supervision of high risk pregnancy, unspecified, third trimester: Secondary | ICD-10-CM | POA: Diagnosis not present

## 2023-02-06 DIAGNOSIS — O98513 Other viral diseases complicating pregnancy, third trimester: Secondary | ICD-10-CM | POA: Diagnosis present

## 2023-02-06 LAB — SARS CORONAVIRUS 2 BY RT PCR: SARS Coronavirus 2 by RT PCR: POSITIVE — AB

## 2023-02-06 NOTE — ED Provider Notes (Signed)
[facility] Provider Note    Event Date/Time   First MD Initiated Contact with Patient 02/06/23 1358     (approximate)   History   Headache and Fever   HPI  Monique Underwood is a 27 y.o. female who is [redacted] weeks pregnant presents emergency department with cough, congestion, runny nose and fever that started last night.  Took some Sudafed this morning.  No vaginal cramping/bleeding.  Did talk to her OB/GYN and they told her to get a COVID test and call them back.      Physical Exam   Triage Vital Signs: ED Triage Vitals  Encounter Vitals Group     BP 02/06/23 1303 121/80     Systolic BP Percentile --      Diastolic BP Percentile --      Pulse Rate 02/06/23 1303 (!) 117     Resp 02/06/23 1303 16     Temp 02/06/23 1303 98.4 F (36.9 C)     Temp Source 02/06/23 1303 Oral     SpO2 02/06/23 1303 97 %     Weight --      Height --      Head Circumference --      Peak Flow --      Pain Score 02/06/23 1305 2     Pain Loc --      Pain Education --      Exclude from Growth Chart --     Most recent vital signs: Vitals:   02/06/23 1303  BP: 121/80  Pulse: (!) 117  Resp: 16  Temp: 98.4 F (36.9 C)  SpO2: 97%     General: Awake, no distress.   CV:  Good peripheral perfusion. regular rate and  rhythm Resp:  Normal effort. Lungs CTA Abd:  No distention.  Abdomen consistent with a 36-week pregnancy Other:     ED Results / Procedures / Treatments   Labs (all labs ordered are listed, but only abnormal results are displayed) Labs Reviewed  SARS CORONAVIRUS 2 BY RT PCR - Abnormal; Notable for the following components:      Result Value   SARS Coronavirus 2 by RT PCR POSITIVE (*)    All other components within normal limits     EKG     RADIOLOGY     PROCEDURES:   Procedures   MEDICATIONS ORDERED IN ED: Medications - No data to display   IMPRESSION / MDM / ASSESSMENT AND PLAN / ED COURSE  I reviewed the triage vital signs  and the nursing notes.                              Differential diagnosis includes, but is not limited to, COVID, URI, bronchitis  Patient's presentation is most consistent with acute illness / injury with system symptoms.   COVID test is positive  I did explain findings to the patient.  She is to continue over-the-counter Sudafed.  Drink plenty of fluids.  Take Tylenol for fever.  Call her OB/GYN to discuss the positive COVID test.  If she is worsening she is to return the emergency department.  Patient is in agreement treatment plan.  Discharged stable condition.      FINAL CLINICAL IMPRESSION(S) / ED DIAGNOSES   Final diagnoses:  COVID     Rx / DC Orders   ED Discharge Orders     None  Note:  This document was prepared using Dragon voice recognition software and may include unintentional dictation errors.    Faythe Ghee, PA-C 02/06/23 1414    Jene Every, MD 02/06/23 [phone]

## 2023-02-06 NOTE — Discharge Instructions (Signed)
Follow up with your ob/gyn, please call and let them know you are positive for covid

## 2023-02-06 NOTE — ED Triage Notes (Signed)
Patient arrives by POV c/o headache, fever and runny nose onset of last night. Took a sudafed this morning.

## 2023-02-13 ENCOUNTER — Encounter (HOSPITAL_COMMUNITY): Payer: Self-pay

## 2023-02-13 ENCOUNTER — Encounter (HOSPITAL_COMMUNITY): Payer: Self-pay | Admitting: Obstetrics and Gynecology

## 2023-02-13 NOTE — Patient Instructions (Signed)
PAYLIN HEAVIN  02/13/2023   Your procedure is scheduled on:  02/27/2023  Arrive at 0530 at Entrance C on [address] at [facility]  and [address]. You are invited to use the FREE valet parking or use the Visitor's parking deck.  Pick up the phone at the desk and dial [phone].  Call this number if you have problems the morning of surgery: [phone]  Remember:   Do not eat food:(After Midnight) Desps de medianoche.  Do not drink clear liquids: (After Midnight) Desps de medianoche.  Take these medicines the morning of surgery with A SIP OF WATER:  none   Do not wear jewelry, make-up or nail polish.  Do not wear lotions, powders, or perfumes. Do not wear deodorant.  Do not shave 48 hours prior to surgery.  Do not bring valuables to the hospital.  [facility] is not   responsible for any belongings or valuables brought to the hospital.  Contacts, dentures or bridgework may not be worn into surgery.  Leave suitcase in the car. After surgery it may be brought to your room.  For patients admitted to the hospital, checkout time is 11:00 AM the day of              discharge.      Please read over the following fact sheets that you were given:     Preparing for Surgery

## 2023-02-14 LAB — OB RESULTS CONSOLE GBS: GBS: NEGATIVE

## 2023-02-21 NOTE — H&P (Signed)
Monique Underwood is a 27 y.o. female presenting for primary elective CS. 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 She has a history of POTS not requiring medications. She is RH negative and is s/p Rhogam. She vapes. She has a history of RA and not on meds for that. She is expecting a RR girl.    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    GERD (gastroesophageal reflux disease)    History of chlamydia infection 01/04/2017   Migraine with aura    Nexplanon in place 04/27/2017   Ovarian cyst    Panic attacks    POTS (postural orthostatic tachycardia syndrome) 2021   RA (rheumatoid arthritis) (HCC)    Raynaud's phenomenon without gangrene 2020   Screen for STD (sexually transmitted disease) 11/01/2017   Syncope 12/11/2014   Past Surgical History:  Procedure Laterality Date   DILATION AND EVACUATION N/A 04/18/2016   Procedure: DILATATION AND EVACUATION;  Surgeon: Hildred Laser, MD;  Location: [facility] ORS;  Service: Gynecology;  Laterality: N/A;   DILATION AND EVACUATION N/A 03/01/2022   Procedure: DILATATION AND EVACUATION;  Surgeon: Ranae Pila, MD;  Location: [facility] OR;  Service: Gynecology;  Laterality: N/A;  Anora kit collected    EEG  7/99   NML   Febrile seizure  7/99   [facility] - w/u negative   MRI - head     NML   OPERATIVE ULTRASOUND N/A 03/01/2022   Procedure: OPERATIVE ULTRASOUND;  Surgeon: Ranae Pila, MD;  Location: [facility] OR;  Service: Gynecology;  Laterality: N/A;   Family History: family  history includes Asthma in her mother; Breast cancer in her paternal grandmother; Cancer in an other family member; Dementia in her maternal grandmother; Diabetes in her paternal grandmother; Ehlers-Danlos syndrome in her father; Thyroid disease in her mother. Social History:  reports that she has been smoking e-cigarettes and cigarettes. She has a 1.5 pack-year smoking history. She has never used smokeless tobacco. She reports that she does not currently use alcohol. She reports that she does not use drugs.     Maternal Diabetes: No Genetic Screening: Normal Maternal Ultrasounds/Referrals: Normal Fetal Ultrasounds or other Referrals:  None Maternal Substance Abuse:  Yes:  Type: Smoker Significant Maternal Medications:  None Significant Maternal Lab Results:  Group B Strep negative Number of Prenatal Visits:greater than 3 verified prenatal visits Other Comments:  None  Review of Systems History   Height 5\' 8"  (1.727 m), weight 72.6 kg, last menstrual period 05/30/2022. Exam Physical Exam  (from office) NAD, A&O NWOB Abd soft, nondistended, gravid  Prenatal labs: ABO, Rh: O/Negative/-- (02/13 0000) Antibody: Negative (02/13 0000) Rubella: Immune (02/13 0000) RPR: Nonreactive (02/13 0000)  HBsAg: Negative (02/13 0000)  HIV:  Non-reactive (02/13 0000)  GBS:     Assessment/Plan:  27 presenting for scheduled primary elective CS. Risks discussed including infection, bleeding, damage to surrounding structures, the need for additional procedures including hysterectomy, and the possibility of uterine rupture with neonatal morbidity/mortality, scarring, and abnormal placentation with subsequent pregnancies. Patient agrees to proceed. 2g ancef on call to OR.     Ranae Pila 02/21/2023, 10:43 AM

## 2023-02-25 ENCOUNTER — Ambulatory Visit (HOSPITAL_COMMUNITY)
Admission: RE | Admit: 2023-02-25 | Discharge: 2023-02-25 | Disposition: A | Discharge status: Home / Self Care | Payer: No Typology Code available for payment source | Source: Ambulatory Visit | Attending: Obstetrics and Gynecology | Admitting: Obstetrics and Gynecology

## 2023-02-25 LAB — CBC
HCT: 38.7 % (ref 36.0–46.0)
Hemoglobin: 12.9 g/dL (ref 12.0–15.0)
MCH: 31.2 pg (ref 26.0–34.0)
MCHC: 33.3 g/dL (ref 30.0–36.0)
MCV: 93.5 fL (ref 80.0–100.0)
Platelets: 299 10*3/uL (ref 150–400)
RBC: 4.14 MIL/uL (ref 3.87–5.11)
RDW: 13 % (ref 11.5–15.5)
WBC: 9.7 10*3/uL (ref 4.0–10.5)
nRBC: 0 % (ref 0.0–0.2)

## 2023-02-25 LAB — HIV ANTIBODY (ROUTINE TESTING W REFLEX): HIV Screen 4th Generation wRfx: NONREACTIVE

## 2023-02-25 LAB — RPR: RPR Ser Ql: NONREACTIVE

## 2023-02-25 NOTE — MAU Note (Signed)
Patient arrived for pre op blood work. Instructions given.

## 2023-02-26 LAB — TYPE AND SCREEN
ABO/RH(D): O NEG
Antibody Screen: POSITIVE

## 2023-02-27 ENCOUNTER — Other Ambulatory Visit: Payer: Self-pay

## 2023-02-27 ENCOUNTER — Inpatient Hospital Stay (HOSPITAL_COMMUNITY)
Admission: RE | Admit: 2023-02-27 | Discharge: 2023-03-01 | DRG: 788 | Disposition: A | Discharge status: Home / Self Care | Payer: No Typology Code available for payment source | Attending: Obstetrics and Gynecology | Admitting: Obstetrics and Gynecology

## 2023-02-27 ENCOUNTER — Inpatient Hospital Stay (HOSPITAL_COMMUNITY): Payer: No Typology Code available for payment source | Admitting: Anesthesiology

## 2023-02-27 ENCOUNTER — Encounter (HOSPITAL_COMMUNITY): Payer: Self-pay | Admitting: Obstetrics and Gynecology

## 2023-02-27 ENCOUNTER — Encounter (HOSPITAL_COMMUNITY)
Admission: RE | Disposition: A | Discharge status: Home / Self Care | Payer: Self-pay | Source: Home / Self Care | Attending: Obstetrics and Gynecology

## 2023-02-27 DIAGNOSIS — O9972 Diseases of the skin and subcutaneous tissue complicating childbirth: Secondary | ICD-10-CM | POA: Diagnosis not present

## 2023-02-27 DIAGNOSIS — O26893 Other specified pregnancy related conditions, third trimester: Secondary | ICD-10-CM | POA: Diagnosis present

## 2023-02-27 DIAGNOSIS — Z3A39 39 weeks gestation of pregnancy: Secondary | ICD-10-CM | POA: Diagnosis not present

## 2023-02-27 DIAGNOSIS — Z6791 Unspecified blood type, Rh negative: Secondary | ICD-10-CM | POA: Diagnosis not present

## 2023-02-27 DIAGNOSIS — O99891 Other specified diseases and conditions complicating pregnancy: Secondary | ICD-10-CM | POA: Diagnosis not present

## 2023-02-27 DIAGNOSIS — F17210 Nicotine dependence, cigarettes, uncomplicated: Secondary | ICD-10-CM | POA: Diagnosis present

## 2023-02-27 DIAGNOSIS — Z23 Encounter for immunization: Secondary | ICD-10-CM | POA: Diagnosis not present

## 2023-02-27 DIAGNOSIS — O34211 Maternal care for low transverse scar from previous cesarean delivery: Secondary | ICD-10-CM | POA: Diagnosis present

## 2023-02-27 DIAGNOSIS — Z3490 Encounter for supervision of normal pregnancy, unspecified, unspecified trimester: Principal | ICD-10-CM

## 2023-02-27 DIAGNOSIS — Q7962 Hypermobile Ehlers-Danlos syndrome: Secondary | ICD-10-CM | POA: Diagnosis not present

## 2023-02-27 DIAGNOSIS — O99334 Smoking (tobacco) complicating childbirth: Secondary | ICD-10-CM | POA: Diagnosis present

## 2023-02-27 DIAGNOSIS — O99352 Diseases of the nervous system complicating pregnancy, second trimester: Secondary | ICD-10-CM | POA: Diagnosis not present

## 2023-02-27 SURGERY — Surgical Case
Anesthesia: Spinal

## 2023-02-27 MED ORDER — IBUPROFEN 600 MG PO TABS
600.0000 mg | ORAL_TABLET | Freq: Four times a day (QID) | ORAL | Status: DC
  Administered 2023-02-28 – 2023-03-01 (×3): 600 mg via ORAL
  Filled 2023-02-27 (×3): qty 1

## 2023-02-27 MED ORDER — ONDANSETRON HCL 4 MG/2ML IJ SOLN
INTRAMUSCULAR | Status: DC | PRN
  Administered 2023-02-27: 4 mg via INTRAVENOUS

## 2023-02-27 MED ORDER — DEXAMETHASONE SODIUM PHOSPHATE 10 MG/ML IJ SOLN
INTRAMUSCULAR | Status: DC | PRN
  Administered 2023-02-27: 4 mg via INTRAVENOUS

## 2023-02-27 MED ORDER — SIMETHICONE 80 MG PO CHEW: 80.0000 mg | CHEWABLE_TABLET | ORAL | Status: DC | PRN

## 2023-02-27 MED ORDER — TRANEXAMIC ACID-NACL 1000-0.7 MG/100ML-% IV SOLN
INTRAVENOUS | Status: AC
  Filled 2023-02-27: qty 100

## 2023-02-27 MED ORDER — FENTANYL CITRATE (PF) 100 MCG/2ML IJ SOLN
INTRAMUSCULAR | Status: AC
  Filled 2023-02-27: qty 2

## 2023-02-27 MED ORDER — STERILE WATER FOR IRRIGATION IR SOLN
Status: DC | PRN
  Administered 2023-02-27: 1

## 2023-02-27 MED ORDER — SOD CITRATE-CITRIC ACID 500-334 MG/5ML PO SOLN
ORAL | Status: AC
  Filled 2023-02-27: qty 30

## 2023-02-27 MED ORDER — LACTATED RINGERS IV SOLN: INTRAVENOUS | Status: DC

## 2023-02-27 MED ORDER — COCONUT OIL OIL: 1.0000 | TOPICAL_OIL | Status: DC | PRN

## 2023-02-27 MED ORDER — MENTHOL 3 MG MT LOZG: 1.0000 | LOZENGE | OROMUCOSAL | Status: DC | PRN

## 2023-02-27 MED ORDER — MORPHINE SULFATE (PF) 0.5 MG/ML IJ SOLN
INTRAMUSCULAR | Status: DC | PRN
  Administered 2023-02-27: 150 ug via INTRATHECAL

## 2023-02-27 MED ORDER — OXYTOCIN-SODIUM CHLORIDE 30-0.9 UT/500ML-% IV SOLN
2.5000 [IU]/h | INTRAVENOUS | Status: AC
  Administered 2023-02-27: 2.5 [IU]/h via INTRAVENOUS
  Filled 2023-02-27: qty 500

## 2023-02-27 MED ORDER — ACETAMINOPHEN 10 MG/ML IV SOLN
INTRAVENOUS | Status: DC | PRN
Start: 2023-02-27 — End: 2023-02-27
  Administered 2023-02-27: 1000 mg via INTRAVENOUS

## 2023-02-27 MED ORDER — MEPERIDINE HCL 25 MG/ML IJ SOLN: 6.2500 mg | INTRAMUSCULAR | Status: DC | PRN

## 2023-02-27 MED ORDER — DIPHENHYDRAMINE HCL 25 MG PO CAPS: 25.0000 mg | ORAL_CAPSULE | ORAL | Status: DC | PRN

## 2023-02-27 MED ORDER — ONDANSETRON HCL 4 MG/2ML IJ SOLN
INTRAMUSCULAR | Status: AC
  Filled 2023-02-27: qty 2

## 2023-02-27 MED ORDER — ZOLPIDEM TARTRATE 5 MG PO TABS: 5.0000 mg | ORAL_TABLET | Freq: Every evening | ORAL | Status: DC | PRN

## 2023-02-27 MED ORDER — HYDROMORPHONE HCL 1 MG/ML IJ SOLN: 0.2000 mg | INTRAMUSCULAR | Status: DC | PRN

## 2023-02-27 MED ORDER — OXYTOCIN-SODIUM CHLORIDE 30-0.9 UT/500ML-% IV SOLN
INTRAVENOUS | Status: DC | PRN
  Administered 2023-02-27: 300 mL via INTRAVENOUS

## 2023-02-27 MED ORDER — MORPHINE SULFATE (PF) 0.5 MG/ML IJ SOLN
INTRAMUSCULAR | Status: AC
  Filled 2023-02-27: qty 10

## 2023-02-27 MED ORDER — TRANEXAMIC ACID-NACL 1000-0.7 MG/100ML-% IV SOLN
1000.0000 mg | Freq: Once | INTRAVENOUS | Status: AC
  Administered 2023-02-27: 1000 mg via INTRAVENOUS

## 2023-02-27 MED ORDER — OXYCODONE HCL 5 MG PO TABS
5.0000 mg | ORAL_TABLET | ORAL | Status: DC | PRN
  Administered 2023-02-28 (×2): 5 mg via ORAL
  Administered 2023-02-28 – 2023-03-01 (×3): 10 mg via ORAL
  Filled 2023-02-27: qty 1
  Filled 2023-02-27: qty 2
  Filled 2023-02-27: qty 1
  Filled 2023-02-27 (×2): qty 2

## 2023-02-27 MED ORDER — DIBUCAINE (PERIANAL) 1 % EX OINT: 1.0000 | TOPICAL_OINTMENT | CUTANEOUS | Status: DC | PRN

## 2023-02-27 MED ORDER — KETOROLAC TROMETHAMINE 30 MG/ML IJ SOLN
INTRAMUSCULAR | Status: AC
  Filled 2023-02-27: qty 1

## 2023-02-27 MED ORDER — PHENYLEPHRINE HCL-NACL 20-0.9 MG/250ML-% IV SOLN
INTRAVENOUS | Status: AC
  Filled 2023-02-27: qty 250

## 2023-02-27 MED ORDER — KETOROLAC TROMETHAMINE 30 MG/ML IJ SOLN
30.0000 mg | Freq: Four times a day (QID) | INTRAMUSCULAR | Status: AC
  Administered 2023-02-27 – 2023-02-28 (×4): 30 mg via INTRAVENOUS
  Filled 2023-02-27 (×4): qty 1

## 2023-02-27 MED ORDER — ONDANSETRON HCL 4 MG/2ML IJ SOLN: 4.0000 mg | Freq: Three times a day (TID) | INTRAMUSCULAR | Status: DC | PRN

## 2023-02-27 MED ORDER — KETOROLAC TROMETHAMINE 30 MG/ML IJ SOLN
30.0000 mg | Freq: Once | INTRAMUSCULAR | Status: AC | PRN
  Administered 2023-02-27: 30 mg via INTRAVENOUS

## 2023-02-27 MED ORDER — DIPHENHYDRAMINE HCL 50 MG/ML IJ SOLN
INTRAMUSCULAR | Status: AC
  Filled 2023-02-27: qty 1

## 2023-02-27 MED ORDER — NALOXONE HCL 0.4 MG/ML IJ SOLN: 0.4000 mg | INTRAMUSCULAR | Status: DC | PRN

## 2023-02-27 MED ORDER — CEFAZOLIN SODIUM-DEXTROSE 2-4 GM/100ML-% IV SOLN
2.0000 g | INTRAVENOUS | Status: AC
  Administered 2023-02-27: 2 g via INTRAVENOUS

## 2023-02-27 MED ORDER — PHENYLEPHRINE HCL-NACL 20-0.9 MG/250ML-% IV SOLN
INTRAVENOUS | Status: DC | PRN
  Administered 2023-02-27: 60 ug/min via INTRAVENOUS

## 2023-02-27 MED ORDER — DEXAMETHASONE SODIUM PHOSPHATE 4 MG/ML IJ SOLN
INTRAMUSCULAR | Status: AC
  Filled 2023-02-27: qty 1

## 2023-02-27 MED ORDER — FENTANYL CITRATE (PF) 100 MCG/2ML IJ SOLN
INTRAMUSCULAR | Status: DC | PRN
  Administered 2023-02-27: 15 ug via INTRATHECAL

## 2023-02-27 MED ORDER — DIPHENHYDRAMINE HCL 50 MG/ML IJ SOLN
12.5000 mg | INTRAMUSCULAR | Status: DC | PRN
  Administered 2023-02-27: 12.5 mg via INTRAVENOUS

## 2023-02-27 MED ORDER — CEFAZOLIN SODIUM-DEXTROSE 2-4 GM/100ML-% IV SOLN
INTRAVENOUS | Status: AC
  Filled 2023-02-27: qty 100

## 2023-02-27 MED ORDER — SODIUM CHLORIDE 0.9% FLUSH: 3.0000 mL | INTRAVENOUS | Status: DC | PRN

## 2023-02-27 MED ORDER — SODIUM CHLORIDE 0.9 % IR SOLN
Status: DC | PRN
  Administered 2023-02-27: 1

## 2023-02-27 MED ORDER — SOD CITRATE-CITRIC ACID 500-334 MG/5ML PO SOLN
30.0000 mL | ORAL | Status: AC
  Administered 2023-02-27: 30 mL via ORAL

## 2023-02-27 MED ORDER — PRENATAL MULTIVITAMIN CH
1.0000 | ORAL_TABLET | Freq: Every day | ORAL | Status: DC
  Administered 2023-02-27 – 2023-02-28 (×2): 1 via ORAL
  Filled 2023-02-27 (×2): qty 1

## 2023-02-27 MED ORDER — WITCH HAZEL-GLYCERIN EX PADS: 1.0000 | MEDICATED_PAD | CUTANEOUS | Status: DC | PRN

## 2023-02-27 MED ORDER — NALOXONE HCL 4 MG/10ML IJ SOLN: 1.0000 ug/kg/h | INTRAVENOUS | Status: DC | PRN

## 2023-02-27 MED ORDER — OXYTOCIN-SODIUM CHLORIDE 30-0.9 UT/500ML-% IV SOLN
INTRAVENOUS | Status: AC
  Filled 2023-02-27: qty 500

## 2023-02-27 MED ORDER — ACETAMINOPHEN 10 MG/ML IV SOLN
INTRAVENOUS | Status: AC
  Filled 2023-02-27: qty 100

## 2023-02-27 MED ORDER — BUPIVACAINE IN DEXTROSE 0.75-8.25 % IT SOLN
INTRATHECAL | Status: DC | PRN
Start: 2023-02-27 — End: 2023-02-27
  Administered 2023-02-27: 1.6 mL via INTRATHECAL

## 2023-02-27 MED ORDER — SCOPOLAMINE 1 MG/3DAYS TD PT72
1.0000 | MEDICATED_PATCH | Freq: Once | TRANSDERMAL | Status: DC
  Administered 2023-02-27: 1.5 mg via TRANSDERMAL

## 2023-02-27 MED ORDER — ACETAMINOPHEN 500 MG PO TABS
1000.0000 mg | ORAL_TABLET | Freq: Four times a day (QID) | ORAL | Status: DC
  Administered 2023-02-27 – 2023-03-01 (×7): 1000 mg via ORAL
  Filled 2023-02-27 (×8): qty 2

## 2023-02-27 MED ORDER — SIMETHICONE 80 MG PO CHEW
80.0000 mg | CHEWABLE_TABLET | Freq: Three times a day (TID) | ORAL | Status: DC
  Administered 2023-02-27 – 2023-03-01 (×6): 80 mg via ORAL
  Filled 2023-02-27 (×6): qty 1

## 2023-02-27 MED ORDER — SENNOSIDES-DOCUSATE SODIUM 8.6-50 MG PO TABS
2.0000 | ORAL_TABLET | Freq: Every day | ORAL | Status: DC
  Administered 2023-02-28 – 2023-03-01 (×2): 2 via ORAL
  Filled 2023-02-27 (×2): qty 2

## 2023-02-27 MED ORDER — DIPHENHYDRAMINE HCL 25 MG PO CAPS: 25.0000 mg | ORAL_CAPSULE | Freq: Four times a day (QID) | ORAL | Status: DC | PRN

## 2023-02-27 MED ORDER — SCOPOLAMINE 1 MG/3DAYS TD PT72
MEDICATED_PATCH | TRANSDERMAL | Status: AC
  Filled 2023-02-27: qty 1

## 2023-02-27 SURGICAL SUPPLY — 33 items
APL PRP STRL LF DISP 70% ISPRP (MISCELLANEOUS) ×2
APL SKNCLS STERI-STRIP NONHPOA (GAUZE/BANDAGES/DRESSINGS) ×1
BENZOIN TINCTURE PRP APPL 2/3 (GAUZE/BANDAGES/DRESSINGS) ×2 IMPLANT
CHLORAPREP W/TINT 26 (MISCELLANEOUS) ×4 IMPLANT
CLAMP UMBILICAL CORD (MISCELLANEOUS) ×2 IMPLANT
CLOTH BEACON ORANGE TIMEOUT ST (SAFETY) ×2 IMPLANT
CLSR STERI-STRIP ANTIMIC 1/2X4 (GAUZE/BANDAGES/DRESSINGS) ×2 IMPLANT
DRSG OPSITE POSTOP 4X10 (GAUZE/BANDAGES/DRESSINGS) ×2 IMPLANT
ELECT REM PT RETURN 9FT ADLT (ELECTROSURGICAL) ×1
ELECTRODE REM PT RTRN 9FT ADLT (ELECTROSURGICAL) ×2 IMPLANT
EXTRACTOR VACUUM KIWI (MISCELLANEOUS) IMPLANT
GAUZE SPONGE 4X4 12PLY STRL LF (GAUZE/BANDAGES/DRESSINGS) IMPLANT
GLOVE BIO SURGEON STRL SZ 6.5 (GLOVE) ×2 IMPLANT
GLOVE BIOGEL PI IND STRL 6.5 (GLOVE) ×2 IMPLANT
GLOVE BIOGEL PI IND STRL 7.0 (GLOVE) ×4 IMPLANT
GOWN STRL REUS W/TWL LRG LVL3 (GOWN DISPOSABLE) ×4 IMPLANT
KIT ABG SYR 3ML LUER SLIP (SYRINGE) ×2 IMPLANT
NDL HYPO 25X5/8 SAFETYGLIDE (NEEDLE) ×2 IMPLANT
NEEDLE HYPO 25X5/8 SAFETYGLIDE (NEEDLE) ×1 IMPLANT
NS IRRIG 1000ML POUR BTL (IV SOLUTION) ×2 IMPLANT
PACK C SECTION WH (CUSTOM PROCEDURE TRAY) ×2 IMPLANT
PAD OB MATERNITY 4.3X12.25 (PERSONAL CARE ITEMS) ×2 IMPLANT
STRIP CLOSURE SKIN 1/2X4 (GAUZE/BANDAGES/DRESSINGS) IMPLANT
SUT PLAIN 0 NONE (SUTURE) IMPLANT
SUT PLAIN 2 0 (SUTURE) ×1
SUT PLAIN ABS 2-0 CT1 27XMFL (SUTURE) ×2 IMPLANT
SUT VIC AB 0 CT1 36 (SUTURE) ×2 IMPLANT
SUT VIC AB 0 CTX 36 (SUTURE) ×3
SUT VIC AB 0 CTX36XBRD ANBCTRL (SUTURE) ×4 IMPLANT
SUT VIC AB 4-0 PS2 27 (SUTURE) ×2 IMPLANT
TOWEL OR 17X24 6PK STRL BLUE (TOWEL DISPOSABLE) ×2 IMPLANT
TRAY FOLEY W/BAG SLVR 14FR LF (SET/KITS/TRAYS/PACK) IMPLANT
WATER STERILE IRR 1000ML POUR (IV SOLUTION) ×2 IMPLANT

## 2023-02-27 NOTE — Lactation Note (Signed)
This note was copied from a baby's chart. Lactation Consultation Note  Patient Name: Monique Underwood [id number] Date: 02/27/2023 Age:27 hours Reason for consult: Initial assessment;Term  P1- MOB states that her and FOB would prefer to FORMULA FEED ONLY because it is less stressful. LC educated MOB on engorgement care and how to suppress her milk.   LC encouraged MOB to contact lactation team if she needs further assistance.  Maternal Data Has patient been taught Hand Expression?: No Does the patient have breastfeeding experience prior to this delivery?: No  Feeding Mother's Current Feeding Choice: Formula Nipple Type: Slow - flow  Discharge Discharge Education: Engorgement and breast care;Warning signs for feeding baby  Consult Status Consult Status: Complete    Dema Severin IBCLC, BS 02/27/2023, 6:11 PM

## 2023-02-27 NOTE — Progress Notes (Signed)
No updates to above H&P. Patient arrived NPO and was consented in PACU. Risks again discussed, all questions answered, and consent signed. Proceed with above surgery.    Elise Leger MD  

## 2023-02-27 NOTE — Op Note (Signed)
PROCEDURE DATE: 02/27/2023   PREOPERATIVE DIAGNOSIS: elective cesarean section   POSTOPERATIVE DIAGNOSIS: The same   PROCEDURE:    Primary Low Transverse Cesarean Section   SURGEON:  Dr. Belva Agee  ASSISTANT: Dr.  An experienced assistant was required given the standard of surgical care given the complexity of the case.  This assistant was needed for exposure, dissection, suctioning, retraction, instrument exchange, assisting with delivery with administration of fundal pressure, and for overall help during the procedure.    INDICATIONS: This is a 27 yo G3P0 at 39.0 wga requiring cesarean section secondary to elective. Decision made to proceed with LTCS. The risks of cesarean section discussed with the patient included but were not limited to: bleeding which may require transfusion or reoperation; infection which may require antibiotics; injury to bowel, bladder, ureters or other surrounding organs; injury to the fetus; need for additional procedures including hysterectomy in the event of a life-threatening hemorrhage; placental abnormalities wth subsequent pregnancies, incisional problems, thromboembolic phenomenon and other postoperative/anesthesia complications. The patient agreed with the proposed plan, giving informed consent for the procedure.     FINDINGS:  Viable female infant in vertex presentation, APGARs pending,  Weight pending, Amniotic fluid clear,  Intact placenta, three vessel cord.  Grossly normal uterus  .   ANESTHESIA:    Epidural ESTIMATED BLOOD LOSS: 264cc SPECIMENS: Placenta for routine COMPLICATIONS: None immediate   PROCEDURE IN DETAIL:  The patient received intravenous antibiotics (2g Ancef) and had sequential compression devices applied to her lower extremities while in the preoperative area.  She was then taken to the operating room where epidural anesthesia was dosed up to surgical level and was found to be adequate. She was then placed in a dorsal supine position  with a leftward tilt, and prepped and draped in a sterile manner.  A foley catheter was placed into her bladder and attached to constant gravity.  After an adequate timeout was performed, a Pfannenstiel skin incision was made with scalpel and carried through to the underlying layer of fascia. The fascia was incised in the midline and this incision was extended bilaterally using the Mayo scissors. Kocher clamps were applied to the superior aspect of the fascial incision and the underlying rectus muscles were dissected off bluntly. A similar process was carried out on the inferior aspect of the facial incision. The rectus muscles were separated in the midline bluntly and the peritoneum was entered bluntly.  A bladder flap was created sharply and developed bluntly. A transverse hysterotomy was made with a scalpel and extended bilaterally bluntly. The bladder blade was then removed. The infant was successfully delivered, and cord was clamped and cut and infant was handed over to awaiting neonatology team. Uterine massage was then administered and the placenta delivered intact with three-vessel cord. Cord gases were taken. The uterus was cleared of clot and debris.  The hysterotomy was closed with 0 vicryl.  A second imbricating suture of 0-vicryl was used to reinforce the incision and aid in hemostasis.The fascia was closed with 0-Vicryl in a running fashion with good restoration of anatomy.  The subcutaneus tissue was irrigated and hemostatic  The skin was closed with 4-0 Vicryl in a subcuticular fashion.  All surgical site and was hemostatic at end of procedure) without any further bleeding on exam.   It's a girl - "Violet"!!    Pt tolerated the procedure well. All sponge/lap/needle counts were correct  X 2. Pt taken to recovery room in stable condition.     Robynn Pane  Elon Spanner MD

## 2023-02-27 NOTE — Transfer of Care (Signed)
Immediate Anesthesia Transfer of Care Note  Patient: AMALIAH STEINBECK  Procedure(s) Performed: PRIMARY CESAREAN SECTION EDC: 03-06-23 ALLERG: POSSIBLE CONTRAST DYE  Patient Location: PACU  Anesthesia Type:Spinal  Level of Consciousness: awake  Airway & Oxygen Therapy: Patient Spontanous Breathing  Post-op Assessment: Report given to RN and Post -op Vital signs reviewed and stable  Post vital signs: Reviewed and stable  Last Vitals:  Vitals Value Taken Time  BP 120/70 02/27/23 0840  Temp    Pulse 90 02/27/23 0842  Resp 15 02/27/23 0842  SpO2 98 % 02/27/23 0842  Vitals shown include unfiled device data.  Last Pain:  Vitals:   02/27/23 0601  TempSrc: Oral  PainSc: 0-No pain         Complications: No notable events documented.

## 2023-02-27 NOTE — Anesthesia Preprocedure Evaluation (Addendum)
Anesthesia Evaluation  Patient identified by MRN, date of birth, ID band Patient awake    Reviewed: Allergy & Precautions, NPO status , Patient's Chart, lab work & pertinent test results  Airway Mallampati: II  TM Distance: >3 FB Neck ROM: Full    Dental no notable dental hx.    Pulmonary Current Smoker and Patient abstained from smoking.   Pulmonary exam normal        Cardiovascular negative cardio ROS  Rhythm:Regular Rate:Normal  ECHO 2024:   1. Left ventricular ejection fraction, by estimation, is 55%. The left ventricle has normal function. The left ventricle has no regional wall motion abnormalities. Left ventricular diastolic parameters were normal.  2. Right ventricular systolic function is normal. The right ventricular size is normal.  3. The mitral valve is normal in structure. Mild mitral valve regurgitation.  4. The aortic valve is tricuspid. Aortic valve regurgitation is not visualized.      Neuro/Psych  Headaches  Anxiety Depression       GI/Hepatic Neg liver ROS,GERD  ,,  Endo/Other  negative endocrine ROS    Renal/GU negative Renal ROS  negative genitourinary   Musculoskeletal  (+) Arthritis , Osteoarthritis,    Abdominal Normal abdominal exam  (+)   Peds  Hematology  (+) Blood dyscrasia, anemia Lab Results      Component                Value               Date                      WBC                      9.7                 02/25/2023                HGB                      12.9                02/25/2023                HCT                      38.7                02/25/2023                MCV                      93.5                02/25/2023                PLT                      299                 02/25/2023              Anesthesia Other Findings   Reproductive/Obstetrics (+) Pregnancy                              Anesthesia Physical Anesthesia Plan  ASA:  2  Anesthesia  Plan: Spinal   Post-op Pain Management:    Induction:   PONV Risk Score and Plan: 1 and Ondansetron, Dexamethasone and Treatment may vary due to age or medical condition  Airway Management Planned: Natural Airway  Additional Equipment: None  Intra-op Plan:   Post-operative Plan:   Informed Consent: I have reviewed the patients History and Physical, chart, labs and discussed the procedure including the risks, benefits and alternatives for the proposed anesthesia with the patient or authorized representative who has indicated his/her understanding and acceptance.     Dental advisory given  Plan Discussed with: CRNA  Anesthesia Plan Comments:         Anesthesia Quick Evaluation

## 2023-02-27 NOTE — Progress Notes (Deleted)
PROCEDURE DATE: 02/27/2023   PREOPERATIVE DIAGNOSIS: elective cesarean section   POSTOPERATIVE DIAGNOSIS: The same   PROCEDURE:    Primary Low Transverse Cesarean Section   SURGEON:  Dr. Belva Agee  ASSISTANT: Dr.  An experienced assistant was required given the standard of surgical care given the complexity of the case.  This assistant was needed for exposure, dissection, suctioning, retraction, instrument exchange, assisting with delivery with administration of fundal pressure, and for overall help during the procedure.    INDICATIONS: This is a 27 yo G3P0 at 39.0 wga requiring cesarean section secondary to elective. Decision made to proceed with LTCS. The risks of cesarean section discussed with the patient included but were not limited to: bleeding which may require transfusion or reoperation; infection which may require antibiotics; injury to bowel, bladder, ureters or other surrounding organs; injury to the fetus; need for additional procedures including hysterectomy in the event of a life-threatening hemorrhage; placental abnormalities wth subsequent pregnancies, incisional problems, thromboembolic phenomenon and other postoperative/anesthesia complications. The patient agreed with the proposed plan, giving informed consent for the procedure.     FINDINGS:  Viable female infant in vertex presentation, APGARs pending,  Weight pending, Amniotic fluid clear,  Intact placenta, three vessel cord.  Grossly normal uterus  .   ANESTHESIA:    Epidural ESTIMATED BLOOD LOSS: 264cc SPECIMENS: Placenta for routine COMPLICATIONS: None immediate   PROCEDURE IN DETAIL:  The patient received intravenous antibiotics (2g Ancef) and had sequential compression devices applied to her lower extremities while in the preoperative area.  She was then taken to the operating room where epidural anesthesia was dosed up to surgical level and was found to be adequate. She was then placed in a dorsal supine position  with a leftward tilt, and prepped and draped in a sterile manner.  A foley catheter was placed into her bladder and attached to constant gravity.  After an adequate timeout was performed, a Pfannenstiel skin incision was made with scalpel and carried through to the underlying layer of fascia. The fascia was incised in the midline and this incision was extended bilaterally using the Mayo scissors. Kocher clamps were applied to the superior aspect of the fascial incision and the underlying rectus muscles were dissected off bluntly. A similar process was carried out on the inferior aspect of the facial incision. The rectus muscles were separated in the midline bluntly and the peritoneum was entered bluntly.  A bladder flap was created sharply and developed bluntly. A transverse hysterotomy was made with a scalpel and extended bilaterally bluntly. The bladder blade was then removed. The infant was successfully delivered, and cord was clamped and cut and infant was handed over to awaiting neonatology team. Uterine massage was then administered and the placenta delivered intact with three-vessel cord. Cord gases were taken. The uterus was cleared of clot and debris.  The hysterotomy was closed with 0 vicryl.  A second imbricating suture of 0-vicryl was used to reinforce the incision and aid in hemostasis.The fascia was closed with 0-Vicryl in a running fashion with good restoration of anatomy.  The subcutaneus tissue was irrigated and hemostatic  The skin was closed with 4-0 Vicryl in a subcuticular fashion.  All surgical site and was hemostatic at end of procedure) without any further bleeding on exam.   It's a girl - "Violet"!!    Pt tolerated the procedure well. All sponge/lap/needle counts were correct  X 2. Pt taken to recovery room in stable condition.     Robynn Pane  Elon Spanner MD

## 2023-02-27 NOTE — Anesthesia Postprocedure Evaluation (Signed)
Anesthesia Post Note  Patient: ANAIYA NOTO  Procedure(s) Performed: PRIMARY CESAREAN SECTION EDC: 03-06-23 ALLERG: POSSIBLE CONTRAST DYE     Patient location during evaluation: Mother Baby Anesthesia Type: Spinal Level of consciousness: oriented and awake and alert Pain management: pain level controlled Vital Signs Assessment: post-procedure vital signs reviewed and stable Respiratory status: spontaneous breathing and respiratory function stable Cardiovascular status: blood pressure returned to baseline and stable Postop Assessment: no headache, no backache, no apparent nausea or vomiting and able to ambulate Anesthetic complications: no   No notable events documented.  Last Vitals:  Vitals:   02/27/23 1153 02/27/23 1253  BP: 118/78 112/76  Pulse: 79 73  Resp: 16 17  Temp: 36.7 C 36.8 C  SpO2: 100% 100%    Last Pain:  Vitals:   02/27/23 1257  TempSrc:   PainSc: 3                  Loghan Kurtzman P Kareem Aul

## 2023-02-28 LAB — CBC
HCT: 33.9 % — ABNORMAL LOW (ref 36.0–46.0)
Hemoglobin: 11.3 g/dL — ABNORMAL LOW (ref 12.0–15.0)
MCH: 30.5 pg (ref 26.0–34.0)
MCHC: 33.3 g/dL (ref 30.0–36.0)
MCV: 91.4 fL (ref 80.0–100.0)
Platelets: 248 10*3/uL (ref 150–400)
RBC: 3.71 MIL/uL — ABNORMAL LOW (ref 3.87–5.11)
RDW: 12.9 % (ref 11.5–15.5)
WBC: 11.9 10*3/uL — ABNORMAL HIGH (ref 4.0–10.5)
nRBC: 0 % (ref 0.0–0.2)

## 2023-02-28 MED ORDER — RHO D IMMUNE GLOBULIN 1500 UNIT/2ML IJ SOSY
300.0000 ug | PREFILLED_SYRINGE | Freq: Once | INTRAMUSCULAR | Status: AC
  Administered 2023-02-28: 300 ug via INTRAVENOUS
  Filled 2023-02-28: qty 2

## 2023-02-28 NOTE — Plan of Care (Signed)

## 2023-02-28 NOTE — Progress Notes (Signed)
Subjective: Postpartum Day 1: Cesarean Delivery Patient reports tolerating PO and no problems voiding.    Objective: Vital signs in last 24 hours: Temp:  [97.7 F (36.5 C)-99.6 F (37.6 C)] 99.6 F (37.6 C) (09/04 0544) Pulse Rate:  [73-90] 75 (09/04 0544) Resp:  [11-20] 17 (09/04 0544) BP: (99-127)/(67-82) 99/67 (09/04 0544) SpO2:  [97 %-100 %] 97 % (09/03 1700)  Physical Exam:  General: alert Lochia: appropriate Uterine Fundus: firm Incision: healing well DVT Evaluation: No evidence of DVT seen on physical exam.  Recent Labs    02/25/23 1036 02/28/23 0514  HGB 12.9 11.3*  HCT 38.7 33.9*    Assessment/Plan: Status post Cesarean section. Doing well postoperatively.  Continue current care.   Ranae Pila, MD 02/28/2023, 8:11 AM

## 2023-02-28 NOTE — Progress Notes (Signed)
MOB was referred for history of depression/anxiety.  * Referral screened out by Clinical Social Worker because none of the following criteria appear to apply:  ~ History of anxiety/depression during this pregnancy, or of post-partum depression following prior delivery.  ~ Diagnosis of anxiety and/or depression within last 3 years  Per OB notes, MOB did not indicate any signs/symptoms during pregnancy.  OR  * MOB's symptoms currently being treated with medication and/or therapy.  Please contact the Clinical Social Worker if needs arise, by MOB request, or if MOB scores greater than 9/yes to question 10 on Edinburgh Postpartum Depression Screen.   Ashley Jones, LCSWA Clinical Social Worker [phone]  

## 2023-03-01 LAB — RH IG WORKUP (INCLUDES ABO/RH)
Fetal Screen: NEGATIVE
Gestational Age(Wks): 39
Unit division: 0

## 2023-03-01 MED ORDER — OXYCODONE HCL 5 MG PO TABS: 5.0000 mg | ORAL_TABLET | ORAL | 0 refills | Status: DC | PRN

## 2023-03-01 MED ORDER — IBUPROFEN 600 MG PO TABS: 600.0000 mg | ORAL_TABLET | Freq: Four times a day (QID) | ORAL | 0 refills | Status: DC | PRN

## 2023-03-01 NOTE — Discharge Summary (Signed)
Postpartum Discharge Summary       Patient Name: Monique Underwood DOB: Jul 26, 1995 MRN: [id number]  Date of admission: 02/27/2023 Delivery date:02/27/2023 Delivering provider: Ranae Pila Date of discharge: 03/01/2023  Admitting diagnosis: Pregnancy [Z34.90] Intrauterine pregnancy: [redacted]w[redacted]d     Secondary diagnosis:  Principal Problem:   Pregnancy  Additional problems:      Discharge diagnosis: Term Pregnancy Delivered                                              Post partum procedures:   Augmentation: N/A Complications: None  Hospital course: Sceduled C/S   27 y.o. yo G3P1021 at [redacted]w[redacted]d was admitted to the hospital 02/27/2023 for scheduled cesarean section with the following indication: hx of EDS and not to labor .Delivery details are as follows:  Membrane Rupture Time/Date: 7:55 AM,02/27/2023  Delivery Method:C-Section, Low Transverse Operative Delivery:N/A Details of operation can be found in separate operative note.  Patient had a postpartum course uncomplicated  She is ambulating, tolerating a regular diet, passing flatus, and urinating well. Patient is discharged home in stable condition on  03/01/23        Newborn Data: Birth date:02/27/2023 Birth time:7:55 AM Gender:Female Living status:Living Apgars:8 ,9  Weight:3170 g    Magnesium Sulfate received: No BMZ received: No Rhophylac:N/A MMR:N/A T-DaP:Given prenatally Flu: N/A Transfusion:No  Physical exam  Vitals:   02/28/23 0544 02/28/23 1449 02/28/23 2125 03/01/23 0540  BP: 99/67 103/70 117/84 116/73  Pulse: 75 81 88 96  Resp: 17 17 18 18   Temp: 99.6 F (37.6 C) 98.3 F (36.8 C) 98.2 F (36.8 C) 98.4 F (36.9 C)  TempSrc:  Oral Oral Oral  SpO2:  100% 100% 99%  Weight:      Height:       General: alert, cooperative, and no distress Lochia: appropriate Uterine Fundus: firm Incision: Healing well with no significant drainage DVT Evaluation: No evidence of DVT seen on physical exam. Labs: Lab  Results  Component Value Date   WBC 11.9 (H) 02/28/2023   HGB 11.3 (L) 02/28/2023   HCT 33.9 (L) 02/28/2023   MCV 91.4 02/28/2023   PLT 248 02/28/2023      Latest Ref Rng & Units 12/26/2017   12:44 PM  CMP  Glucose 70 - 99 mg/dL 95   BUN 6 - 20 mg/dL 8   Creatinine 8.41 - 3.24 mg/dL 4.01   Sodium 027 - 253 mmol/L 141   Potassium 3.5 - 5.1 mmol/L 3.9   Chloride 98 - 111 mmol/L 108   CO2 22 - 32 mmol/L 25   Calcium 8.9 - 10.3 mg/dL 9.4   Total Protein 6.5 - 8.1 g/dL 7.4   Total Bilirubin 0.3 - 1.2 mg/dL 0.8   Alkaline Phos 38 - 126 U/L 49   AST 15 - 41 U/L 20   ALT 0 - 44 U/L 14    Edinburgh Score:    02/28/2023    8:56 AM  Edinburgh Postnatal Depression Scale Screening Tool  I have been able to laugh and see the funny side of things. 0  I have looked forward with enjoyment to things. 0  I have blamed myself unnecessarily when things went wrong. 0  I have been anxious or worried for no good reason. 0  I have felt scared or panicky for no good reason. 0  Things have been getting on top of me. 0  I have been so unhappy that I have had difficulty sleeping. 0  I have felt sad or miserable. 0  I have been so unhappy that I have been crying. 0  The thought of harming myself has occurred to me. 0  Edinburgh Postnatal Depression Scale Total 0      After visit meds:  Allergies as of 03/01/2023       Reactions   Lexapro [escitalopram Oxalate] Other (See Comments)   GI upset.         Medication List     TAKE these medications    hydrocortisone 2.5 % rectal cream Commonly known as: Anusol-HC Place 1 application rectally 2 (two) times daily. What changed:  when to take this reasons to take this   ibuprofen 600 MG tablet Commonly known as: ADVIL Take 1 tablet (600 mg total) by mouth every 6 (six) hours as needed.   lidocaine 2 % jelly Commonly known as: XYLOCAINE Apply 1 application topically as needed.   oxyCODONE 5 MG immediate release tablet Commonly known  as: Oxy IR/ROXICODONE Take 1-2 tablets (5-10 mg total) by mouth every 4 (four) hours as needed for moderate pain or severe pain.   prenatal multivitamin Tabs tablet Take 1 tablet by mouth daily at 12 noon.         Discharge home in stable condition Infant Feeding: Breast Infant Disposition:home with mother Discharge instruction: per After Visit Summary and Postpartum booklet. Activity: Advance as tolerated. Pelvic rest for 6 weeks.  Diet: routine diet Anticipated Birth Control: Unsure Postpartum Appointment:6 weeks Additional Postpartum F/U:    Future Appointments:No future appointments. Follow up Visit:      03/01/2023 Turner Daniels, MD

## 2023-03-01 NOTE — Discharge Instructions (Signed)
WHAT TO LOOK OUT FOR: Fever of 100.4 or above Mastitis: feels like flu and breasts hurt Infection: increased pain, swelling or redness Blood clots golf ball size or larger Postpartum depression   Congratulations on your newest addition!

## 2023-03-01 NOTE — Plan of Care (Signed)
Problem: Education: Goal: Knowledge of General Education information will improve Description: Including pain rating scale, medication(s)/side effects and non-pharmacologic comfort measures 03/01/2023 0949 by Donne Hazel, LPN Outcome: Adequate for Discharge 03/01/2023 0750 by Donne Hazel, LPN Outcome: Progressing   Problem: Health Behavior/Discharge Planning: Goal: Ability to manage health-related needs will improve 03/01/2023 0949 by Donne Hazel, LPN Outcome: Adequate for Discharge 03/01/2023 0750 by Donne Hazel, LPN Outcome: Progressing   Problem: Clinical Measurements: Goal: Ability to maintain clinical measurements within normal limits will improve 03/01/2023 0949 by Donne Hazel, LPN Outcome: Adequate for Discharge 03/01/2023 0750 by Donne Hazel, LPN Outcome: Progressing Goal: Will remain free from infection 03/01/2023 0949 by Donne Hazel, LPN Outcome: Adequate for Discharge 03/01/2023 0750 by Donne Hazel, LPN Outcome: Progressing Goal: Diagnostic test results will improve 03/01/2023 0949 by Donne Hazel, LPN Outcome: Adequate for Discharge 03/01/2023 0750 by Donne Hazel, LPN Outcome: Progressing Goal: Respiratory complications will improve 03/01/2023 0949 by Donne Hazel, LPN Outcome: Adequate for Discharge 03/01/2023 0750 by Donne Hazel, LPN Outcome: Progressing Goal: Cardiovascular complication will be avoided 03/01/2023 0949 by Donne Hazel, LPN Outcome: Adequate for Discharge 03/01/2023 0750 by Donne Hazel, LPN Outcome: Progressing   Problem: Activity: Goal: Risk for activity intolerance will decrease 03/01/2023 0949 by Donne Hazel, LPN Outcome: Adequate for Discharge 03/01/2023 0750 by Donne Hazel, LPN Outcome: Progressing   Problem: Nutrition: Goal: Adequate nutrition will be maintained 03/01/2023 0949 by Donne Hazel, LPN Outcome: Adequate for Discharge 03/01/2023 0750 by Donne Hazel, LPN Outcome: Progressing   Problem:  Coping: Goal: Level of anxiety will decrease 03/01/2023 0949 by Donne Hazel, LPN Outcome: Adequate for Discharge 03/01/2023 0750 by Donne Hazel, LPN Outcome: Progressing   Problem: Elimination: Goal: Will not experience complications related to bowel motility 03/01/2023 0949 by Donne Hazel, LPN Outcome: Adequate for Discharge 03/01/2023 0750 by Donne Hazel, LPN Outcome: Progressing Goal: Will not experience complications related to urinary retention 03/01/2023 0949 by Donne Hazel, LPN Outcome: Adequate for Discharge 03/01/2023 0750 by Donne Hazel, LPN Outcome: Progressing   Problem: Pain Managment: Goal: General experience of comfort will improve 03/01/2023 0949 by Donne Hazel, LPN Outcome: Adequate for Discharge 03/01/2023 0750 by Donne Hazel, LPN Outcome: Progressing   Problem: Safety: Goal: Ability to remain free from injury will improve 03/01/2023 0949 by Donne Hazel, LPN Outcome: Adequate for Discharge 03/01/2023 0750 by Donne Hazel, LPN Outcome: Progressing   Problem: Skin Integrity: Goal: Risk for impaired skin integrity will decrease 03/01/2023 0949 by Donne Hazel, LPN Outcome: Adequate for Discharge 03/01/2023 0750 by Donne Hazel, LPN Outcome: Progressing   Problem: Education: Goal: Knowledge of the prescribed therapeutic regimen will improve 03/01/2023 0949 by Donne Hazel, LPN Outcome: Adequate for Discharge 03/01/2023 0750 by Donne Hazel, LPN Outcome: Progressing Goal: Understanding of sexual limitations or changes related to disease process or condition will improve 03/01/2023 0949 by Donne Hazel, LPN Outcome: Adequate for Discharge 03/01/2023 0750 by Donne Hazel, LPN Outcome: Progressing Goal: Individualized Educational Video(s) 03/01/2023 0949 by Donne Hazel, LPN Outcome: Adequate for Discharge 03/01/2023 0750 by Donne Hazel, LPN Outcome: Progressing   Problem: Self-Concept: Goal: Communication of feelings regarding changes in  body function or appearance will improve 03/01/2023 0949 by Donne Hazel, LPN Outcome: Adequate for Discharge 03/01/2023 0750 by Donne Hazel, LPN Outcome: Progressing   Problem: Skin  Integrity: Goal: Demonstration of wound healing without infection will improve 03/01/2023 0949 by Donne Hazel, LPN Outcome: Adequate for Discharge 03/01/2023 0750 by Donne Hazel, LPN Outcome: Progressing   Problem: Education: Goal: Knowledge of condition will improve 03/01/2023 0949 by Donne Hazel, LPN Outcome: Adequate for Discharge 03/01/2023 0750 by Donne Hazel, LPN Outcome: Progressing Goal: Individualized Educational Video(s) 03/01/2023 0949 by Donne Hazel, LPN Outcome: Adequate for Discharge 03/01/2023 0750 by Donne Hazel, LPN Outcome: Progressing Goal: Individualized Newborn Educational Video(s) 03/01/2023 0949 by Donne Hazel, LPN Outcome: Adequate for Discharge 03/01/2023 0750 by Donne Hazel, LPN Outcome: Progressing   Problem: Activity: Goal: Will verbalize the importance of balancing activity with adequate rest periods 03/01/2023 0949 by Donne Hazel, LPN Outcome: Adequate for Discharge 03/01/2023 0750 by Donne Hazel, LPN Outcome: Progressing Goal: Ability to tolerate increased activity will improve 03/01/2023 0949 by Donne Hazel, LPN Outcome: Adequate for Discharge 03/01/2023 0750 by Donne Hazel, LPN Outcome: Progressing   Problem: Coping: Goal: Ability to identify and utilize available resources and services will improve 03/01/2023 0949 by Donne Hazel, LPN Outcome: Adequate for Discharge 03/01/2023 0750 by Donne Hazel, LPN Outcome: Progressing   Problem: Life Cycle: Goal: Chance of risk for complications during the postpartum period will decrease 03/01/2023 0949 by Donne Hazel, LPN Outcome: Adequate for Discharge 03/01/2023 0750 by Donne Hazel, LPN Outcome: Progressing   Problem: Role Relationship: Goal: Ability to demonstrate positive interaction with  newborn will improve 03/01/2023 0949 by Donne Hazel, LPN Outcome: Adequate for Discharge 03/01/2023 0750 by Donne Hazel, LPN Outcome: Progressing   Problem: Skin Integrity: Goal: Demonstration of wound healing without infection will improve 03/01/2023 0949 by Donne Hazel, LPN Outcome: Adequate for Discharge 03/01/2023 0750 by Donne Hazel, LPN Outcome: Progressing

## 2023-03-01 NOTE — Plan of Care (Signed)

## 2023-03-31 ENCOUNTER — Telehealth (HOSPITAL_COMMUNITY): Payer: Self-pay | Admitting: *Deleted

## 2023-03-31 NOTE — Telephone Encounter (Signed)
Attempted hospital discharge follow-up call. Left message for patient to return RN call with any questions or concerns. Deforest Hoyles, RN, 03/31/23, [phone]

## 2023-06-14 ENCOUNTER — Encounter: Payer: Self-pay | Admitting: Internal Medicine

## 2023-06-14 ENCOUNTER — Ambulatory Visit: Payer: No Typology Code available for payment source | Admitting: Internal Medicine

## 2023-06-14 VITALS — BP 96/60 | HR 103 | Temp 99.0°F | Ht 68.0 in | Wt 146.0 lb

## 2023-06-14 DIAGNOSIS — J029 Acute pharyngitis, unspecified: Secondary | ICD-10-CM | POA: Diagnosis not present

## 2023-06-14 HISTORY — DX: Acute pharyngitis, unspecified: J02.9

## 2023-06-14 LAB — POCT RAPID STREP A (OFFICE): Rapid Strep A Screen: NEGATIVE

## 2023-06-14 MED ORDER — BENZONATATE 200 MG PO CAPS: 200.0000 mg | ORAL_CAPSULE | Freq: Three times a day (TID) | ORAL | 0 refills | Status: DC | PRN

## 2023-06-14 NOTE — Assessment & Plan Note (Signed)
Strep negative Seems to be viral Discussed analgesics, honey, cough suppressant Benzonatate for prn

## 2023-06-14 NOTE — Progress Notes (Signed)
Subjective:    Patient ID: Monique Underwood, female    DOB: 17-Mar-1996, 27 y.o.   MRN: [id number]  HPI Here due to sore throat  Started ~5-6 days ago Tickle in throat--then itchy/scratchy Odynophagia  No fever Lots of cough---bad at night No SOB  41 month old at home----not nursing Babysitter's child had strep last week  Tried mucinex--sinus max---didn't help  Current Outpatient Medications on File Prior to Visit  Medication Sig Dispense Refill   hydrocortisone (ANUSOL-HC) 2.5 % rectal cream Place 1 application rectally 2 (two) times daily. (Patient taking differently: Place 1 application  rectally 2 (two) times daily as needed (hemorrhoids).) 30 g 0   ibuprofen (ADVIL) 600 MG tablet Take 1 tablet (600 mg total) by mouth every 6 (six) hours as needed. 30 tablet 0   lidocaine (XYLOCAINE) 2 % jelly Apply 1 application topically as needed. 30 mL 2   No current facility-administered medications on file prior to visit.    Allergies  Allergen Reactions   Lexapro [Escitalopram Oxalate] Other (See Comments)    GI upset.     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    GERD (gastroesophageal reflux disease)    History of chlamydia infection 01/04/2017   Migraine with aura    Nexplanon in place 04/27/2017   Ovarian cyst    Panic attacks    POTS (postural orthostatic tachycardia syndrome) 2021   RA (rheumatoid arthritis) (HCC)    Raynaud's phenomenon without gangrene 2020   Screen for STD (sexually transmitted disease) 11/01/2017   Syncope 12/11/2014    Past Surgical History:  Procedure Laterality Date   CESAREAN SECTION N/A 02/27/2023   Procedure: PRIMARY CESAREAN SECTION EDC: 03-06-23 ALLERG: POSSIBLE CONTRAST DYE;  Surgeon: Ranae Pila, MD;  Location: [facility];  Service: Obstetrics;  Laterality: N/A;   DILATION AND EVACUATION N/A 04/18/2016   Procedure: DILATATION AND EVACUATION;  Surgeon: Hildred Laser, MD;  Location: [facility] ORS;  Service: Gynecology;  Laterality: N/A;   DILATION AND EVACUATION N/A 03/01/2022   Procedure: DILATATION AND EVACUATION;  Surgeon: Ranae Pila, MD;  Location: [facility] OR;  Service: Gynecology;  Laterality: N/A;  Anora kit collected    EEG  7/99   NML   Febrile seizure  7/99   [facility] - w/u negative   MRI - head     NML   OPERATIVE ULTRASOUND N/A 03/01/2022   Procedure: OPERATIVE ULTRASOUND;  Surgeon: Ranae Pila, MD;  Location: [facility] OR;  Service: Gynecology;  Laterality: N/A;    Family History  Problem Relation Age of Onset   Asthma Mother    Thyroid disease Mother    Ehlers-Danlos syndrome Father    Dementia Maternal Grandmother    Diabetes Paternal Grandmother    Breast cancer Paternal Grandmother    Cancer Other    Ovarian cancer Neg Hx    Colon cancer Neg Hx    Heart disease Neg Hx    Hypertension Neg Hx     Social History   Socioeconomic History   Marital status: Married    Spouse name: Not on file   Number of children: Not on file   Years of education: Not on file  Highest education level: Not on file  Occupational History   Not on file  Tobacco Use   Smoking status: Every Day    Current packs/day: 0.50    Average packs/day: 0.5 packs/day for 3.0 years (1.5 ttl pk-yrs)    Types: E-cigarettes, Cigarettes   Smokeless tobacco: Never  Vaping Use   Vaping status: Every Day   Substances: Nicotine  Substance and Sexual Activity   Alcohol use: Not Currently    Comment: occass   Drug use: No   Sexual activity: Yes    Comment: Nexplanon inserted 02/21/17  Other Topics Concern   Not on file  Social History Narrative   Lives with boyfriend   Working at [employer] Strain: Not on file  Food  Insecurity: No Food Insecurity (02/27/2023)   Hunger Vital Sign    Worried About Running Out of Food in the Last Year: Never true    Ran Out of Food in the Last Year: Never true  Transportation Needs: No Transportation Needs (02/27/2023)   PRAPARE - Administrator, Civil Service (Medical): No    Lack of Transportation (Non-Medical): No  Physical Activity: Not on file  Stress: Not on file  Social Connections: Not on file  Intimate Partner Violence: Not At Risk (02/27/2023)   Humiliation, Afraid, Rape, and Kick questionnaire    Fear of Current or Ex-Partner: No    Emotionally Abused: No    Physically Abused: No    Sexually Abused: No   Review of Systems Some red rash on arms and back--2 weeks ago. Better with benedryl Nausea in AM---from congestion in throat No headache Nose dry and runny--but not congested    Objective:   Physical Exam Constitutional:      Appearance: She is well-developed.  HENT:     Right Ear: Tympanic membrane and ear canal normal.     Left Ear: Tympanic membrane and ear canal normal.     Mouth/Throat:     Comments: Mild redness in uvula No tonsillar enlargement Pulmonary:     Effort: Pulmonary effort is normal.     Breath sounds: Normal breath sounds. No wheezing or rales.  Musculoskeletal:     Cervical back: Neck supple.  Lymphadenopathy:     Cervical: No cervical adenopathy.  Neurological:     Mental Status: She is alert.            Assessment & Plan:

## 2023-06-14 NOTE — Addendum Note (Signed)
Addended by: Eual Fines on: 06/14/2023 11:40 AM   Modules accepted: Orders

## 2024-03-25 DIAGNOSIS — N96 Recurrent pregnancy loss: Secondary | ICD-10-CM | POA: Diagnosis not present

## 2024-03-25 DIAGNOSIS — N911 Secondary amenorrhea: Secondary | ICD-10-CM | POA: Diagnosis not present

## 2024-03-25 DIAGNOSIS — G90A Postural orthostatic tachycardia syndrome (POTS): Secondary | ICD-10-CM | POA: Diagnosis not present

## 2024-03-25 DIAGNOSIS — Q796 Ehlers-Danlos syndrome, unspecified: Secondary | ICD-10-CM | POA: Diagnosis not present

## 2024-04-01 ENCOUNTER — Other Ambulatory Visit: Payer: Self-pay | Admitting: Obstetrics and Gynecology

## 2024-04-01 DIAGNOSIS — Q796 Ehlers-Danlos syndrome, unspecified: Secondary | ICD-10-CM

## 2024-04-02 DIAGNOSIS — Z3481 Encounter for supervision of other normal pregnancy, first trimester: Secondary | ICD-10-CM | POA: Diagnosis not present

## 2024-04-02 DIAGNOSIS — Z3A08 8 weeks gestation of pregnancy: Secondary | ICD-10-CM | POA: Diagnosis not present

## 2024-04-02 DIAGNOSIS — Z3685 Encounter for antenatal screening for Streptococcus B: Secondary | ICD-10-CM | POA: Diagnosis not present

## 2024-04-16 DIAGNOSIS — Z113 Encounter for screening for infections with a predominantly sexual mode of transmission: Secondary | ICD-10-CM | POA: Diagnosis not present

## 2024-04-16 DIAGNOSIS — Z3481 Encounter for supervision of other normal pregnancy, first trimester: Secondary | ICD-10-CM | POA: Diagnosis not present

## 2024-04-16 DIAGNOSIS — Z3A10 10 weeks gestation of pregnancy: Secondary | ICD-10-CM | POA: Diagnosis not present

## 2024-04-16 DIAGNOSIS — Z124 Encounter for screening for malignant neoplasm of cervix: Secondary | ICD-10-CM | POA: Diagnosis not present

## 2024-04-30 ENCOUNTER — Ambulatory Visit: Admitting: Nurse Practitioner

## 2024-04-30 ENCOUNTER — Encounter: Payer: Self-pay | Admitting: Nurse Practitioner

## 2024-04-30 VITALS — BP 98/59 | HR 100 | Temp 98.5°F | Ht 68.0 in | Wt 127.2 lb

## 2024-04-30 DIAGNOSIS — R52 Pain, unspecified: Secondary | ICD-10-CM | POA: Diagnosis not present

## 2024-04-30 DIAGNOSIS — R0982 Postnasal drip: Secondary | ICD-10-CM

## 2024-04-30 DIAGNOSIS — R051 Acute cough: Secondary | ICD-10-CM

## 2024-04-30 DIAGNOSIS — Z3A13 13 weeks gestation of pregnancy: Secondary | ICD-10-CM

## 2024-04-30 MED ORDER — CETIRIZINE HCL 10 MG PO TABS
10.0000 mg | ORAL_TABLET | Freq: Every day | ORAL | 11 refills | Status: AC
Start: 2024-04-30 — End: ?

## 2024-04-30 MED ORDER — FLUTICASONE PROPIONATE 50 MCG/ACT NA SUSP
2.0000 | Freq: Every day | NASAL | 0 refills | Status: AC
Start: 2024-04-30 — End: ?

## 2024-04-30 NOTE — Progress Notes (Signed)
 Established Patient Office Visit  Subjective   Patient ID: Monique Underwood, female    DOB: 01/03/1996  Age: 28 y.o. MRN: [id number]  Chief Complaint  Patient presents with   Cough    Pt complains of body aches, bad dry cough, trouble sleeping. That started over the weekend. Pt states of sore/swollen throat. No fever or chest pain.        With a history of POTS, dysautomnoia, Ehlers-danlos, RA. Raynauds   Discussed the use of AI scribe software for clinical note transcription with the patient, who gave verbal consent to proceed.  History of Present Illness Monique Underwood is a 28 year old female who presents with a persistent cough and viral symptoms.  Symptoms began approximately a week and a half ago, with a cough developing about five days ago. The cough is primarily bothersome when lying down, causing sleep disturbances due to an 'itchy, scratchy' throat that requires frequent clearing. She has tried using a humidifier, but it made her house too humid.  Her daughter was sick from a daycare outbreak about two weeks ago, and the patient believes she contracted the illness from her daughter.  She is [redacted] weeks pregnant and is currently taking prenatal vitamins and low-dose aspirin. She has not received a flu shot or COVID-19 vaccines. For her symptoms, she has been using cough drops and recently tried a throat numbing spray for relief.  She experiences chills, body aches, and fatigue, describing her body as feeling like she 'ran a marathon.' She has had a slight headache and throat irritation, which feels swollen and tight, but no sore throat. The cough is dry and not productive. No shortness of breath, but some nausea and vomiting, which she attributes to her pregnancy, as she experienced similar symptoms in her previous pregnancy. No diarrhea, but notes some mild cramping without any bleeding.  No fever, ear pain, or significant headache. She is trying to maintain adequate fluid  intake despite the challenges of pregnancy.     Review of Systems  Constitutional:  Positive for chills and malaise/fatigue. Negative for fever.  HENT:  Negative for ear discharge, ear pain and sore throat.   Respiratory:  Positive for cough. Negative for sputum production and shortness of breath.   Cardiovascular:  Negative for chest pain.  Gastrointestinal:  Positive for nausea and vomiting. Negative for abdominal pain and diarrhea.  Musculoskeletal:  Positive for myalgias.  Neurological:  Positive for headaches.      Objective:     BP (!) 98/59   Pulse 100   Temp 98.5 F (36.9 C) (Oral)   Ht 5' 8 (1.727 m)   Wt 127 lb 3.2 oz (57.7 kg)   SpO2 97%   BMI 19.34 kg/m    Physical Exam Vitals and nursing note reviewed.  Constitutional:      Appearance: Normal appearance.  HENT:     Right Ear: Tympanic membrane, ear canal and external ear normal.     Left Ear: Tympanic membrane, ear canal and external ear normal.     Nose:     Right Sinus: No maxillary sinus tenderness or frontal sinus tenderness.     Left Sinus: No frontal sinus tenderness.     Mouth/Throat:     Mouth: Mucous membranes are moist.     Pharynx: Oropharynx is clear.  Cardiovascular:     Rate and Rhythm: Normal rate and regular rhythm.     Heart sounds: Normal heart sounds.  Pulmonary:  Effort: Pulmonary effort is normal.     Breath sounds: Normal breath sounds.  Lymphadenopathy:     Cervical: Cervical adenopathy present.  Neurological:     Mental Status: She is alert.      No results found for any visits on 04/30/24.    The ASCVD Risk score (Arnett DK, et al., 2019) failed to calculate for the following reasons:   The 2019 ASCVD risk score is only valid for ages 49 to 50    Assessment & Plan:   Problem List Items Addressed This Visit       Other   Pregnancy   Other Visit Diagnoses       Acute cough    -  Primary   Relevant Medications   fluticasone  (FLONASE ) 50 MCG/ACT nasal  spray   cetirizine (ZYRTEC) 10 MG tablet     Body aches         PND (post-nasal drip)       Relevant Medications   fluticasone  (FLONASE ) 50 MCG/ACT nasal spray      Assessment and Plan Assessment & Plan Acute viral upper respiratory infection Negative for COVID-19, strep, and flu. No shortness of breath or significant fever. Lymphadenopathy present. - Advise acetaminophen  for body aches, avoid ibuprofen  due to pregnancy. - Recommend cetirizine at bedtime for postnasal drip and itchy throat. - Suggest fluticasone  nasal spray for postnasal drip. - Encourage warm salt water  gargles for throat irritation. - Advise adequate fluid intake. - Recommend elevating head with extra pillows during sleep to alleviate cough. - Instruct to follow up if symptoms do not improve by the weekend or worsen.  Pregnancy, first trimester Currently [redacted] weeks pregnant. Discussed medication safety during pregnancy. - Avoid ibuprofen  due to potential effects on fetal renal development. - Confirm safety of medications with OB/GYN or pharmacist as needed.  Return if symptoms worsen or fail to improve.    Adina Crandall, NP

## 2024-04-30 NOTE — Patient Instructions (Signed)
 Nice to see you today Covid, flu, and strep test was negative in office Drink plenty of fluid Follow up if you do not improve

## 2024-05-15 DIAGNOSIS — Z361 Encounter for antenatal screening for raised alphafetoprotein level: Secondary | ICD-10-CM | POA: Diagnosis not present

## 2024-06-02 ENCOUNTER — Encounter: Payer: Self-pay | Admitting: *Deleted

## 2024-06-11 ENCOUNTER — Ambulatory Visit: Attending: Obstetrics and Gynecology | Admitting: Obstetrics and Gynecology

## 2024-06-11 ENCOUNTER — Other Ambulatory Visit

## 2024-06-11 ENCOUNTER — Ambulatory Visit

## 2024-06-11 VITALS — BP 114/57 | HR 96

## 2024-06-11 DIAGNOSIS — O99332 Smoking (tobacco) complicating pregnancy, second trimester: Secondary | ICD-10-CM | POA: Diagnosis not present

## 2024-06-11 DIAGNOSIS — Z3A18 18 weeks gestation of pregnancy: Secondary | ICD-10-CM | POA: Diagnosis not present

## 2024-06-11 DIAGNOSIS — Q7962 Hypermobile Ehlers-Danlos syndrome: Secondary | ICD-10-CM | POA: Insufficient documentation

## 2024-06-11 DIAGNOSIS — Q796 Ehlers-Danlos syndrome, unspecified: Secondary | ICD-10-CM | POA: Diagnosis not present

## 2024-06-11 DIAGNOSIS — G90A Postural orthostatic tachycardia syndrome (POTS): Secondary | ICD-10-CM | POA: Insufficient documentation

## 2024-06-11 DIAGNOSIS — Z8489 Family history of other specified conditions: Secondary | ICD-10-CM | POA: Diagnosis not present

## 2024-06-11 DIAGNOSIS — O99330 Smoking (tobacco) complicating pregnancy, unspecified trimester: Secondary | ICD-10-CM

## 2024-06-11 DIAGNOSIS — O352XX0 Maternal care for (suspected) hereditary disease in fetus, not applicable or unspecified: Secondary | ICD-10-CM | POA: Diagnosis not present

## 2024-06-11 DIAGNOSIS — O34219 Maternal care for unspecified type scar from previous cesarean delivery: Secondary | ICD-10-CM | POA: Diagnosis not present

## 2024-06-11 DIAGNOSIS — O360120 Maternal care for anti-D [Rh] antibodies, second trimester, not applicable or unspecified: Secondary | ICD-10-CM

## 2024-06-11 DIAGNOSIS — F17210 Nicotine dependence, cigarettes, uncomplicated: Secondary | ICD-10-CM | POA: Diagnosis not present

## 2024-06-11 DIAGNOSIS — Z363 Encounter for antenatal screening for malformations: Secondary | ICD-10-CM | POA: Diagnosis not present

## 2024-06-11 DIAGNOSIS — Z6791 Unspecified blood type, Rh negative: Secondary | ICD-10-CM | POA: Diagnosis not present

## 2024-06-11 DIAGNOSIS — O26892 Other specified pregnancy related conditions, second trimester: Secondary | ICD-10-CM

## 2024-06-11 NOTE — Progress Notes (Signed)
 Maternal-Fetal Medicine Consultation  Name: Monique Underwood  MRN: [id number]  GA: [id number] [redacted]w[redacted]d   Patient is here for fetal anatomy scan.  On cell-free fetal DNA screening, the risks of fetal aneuploidies are not increased.  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  Patient is not taking any medications and reports her symptoms are infrequent.  Patient had echocardiography last year that was reported as normal. - Previous cesarean delivery.  Patient had an elective cesarean delivery in September 2024 of a female infant weighing 6 pounds and 15 ounces at birth.  Patient requested cesarean delivery. - Short interval between pregnancies. 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 Patient is not on prophylactic anticoagulation. - Vaping before pregnancy. 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 Patient reports that on cell-free fetal DNA screening, the fetus is Rh negative. She reports she does not have rheumatoid arthritis.  Ultrasound Fetal biometry is consistent with her previously-established dates. Normal amniotic fluid.  No makers of aneuploidies or fetal structural defects are seen.  Patient understands the limitations of ultrasound in detecting fetal anomalies.   I counseled the patient on the following: Ehlers-Danlos Syndrome - The 2017 International Classification recognizes 13 subtypes of EDS. Hypermobile EDS (hEDS) (type III is one of them) is the most common form of EDS. Accounts for 35% of EDS. -Autosomal dominant transmission is expected and the exact genetic mechanism is not known (mutation not identified).  -Features include skin hyperextensibility, smooth velvety skin, atrophic scarring, musculoskeletal complications, family history. Other symptoms include epistaxis (fragility of mucus membrane),  dislocations of joints and strains. Some patients can have chronic pelvic or abdominal pain. -Cardiovascular complications including mitral/tricuspid valve prolapse and aortic root dilations have been reported. Patient had normal echocardiography last year.  -Overall, pregnancies in patients with hEDS are associated with good outcomes. -Cervical insufficiency reported with Classical EDS is not commonly seen. Routine prophylactic cerclage is not necessary.  -Preterm premature rupture of membranes (PPROM), preterm delivery and fetal growth restriction are not increased but reported in some case studies.  -Precipitate labor can occur and lead to extensive perineal tear. Episiotomy during delivery prevents extensive tears and should be considered. -Positioning of patients (especially after epidural) to avoid excess abduction is important to avoid hip dislocation. -Postpartum hemorrhage is common in women with EDS and blood should be available at short notice after delivery. Our patient did not have postpartum hemorrhage after cesarean delivery. -Wound healing can be delayed in patients with EDS. Patient reports good wound healing. - Consultation with anesthesiologist before delivery is helpful.   Postural Orthostatic Tachycardia Syndrome (POTS) In pregnancy, POTS remained stable or improved in about 50% of the pregnancies. After delivery at 6 months, POTS worsened in about 50% of cases.  Preterm delivery and preeclampsia were not increased. Vaginal deliveries did not increase maternal adverse outcomes. Medications, if indicated should be chosen accordingly in pregnancy. Some of the common medications are fludricortisone, and beta blockers can be safely given.  Previous cesarean delivery I reassured the patient of normal placental location and that there is no evidence of previa or placenta accreta spectrum. However, placenta should be reassessed around 28 weeks' gestation.  I discussed the benefits  and risks of vaginal birth after cesarean section (VBAC).  Repeat cesarean deliveries increase the risks of placenta previa and/or placenta accreta spectrum. Long-term complications include small bowel obstruction. Patient is very  firm in her decision to have elective repeat cesarean delivery. She is planning bilateral tubal sterilization.  We briefly discussed sterilization procedure and its failure rate (1%).  I counseled the patient that women under 70 years' age have a higher regret rate after sterilization procedure.  Short Interpregnancy Interval It is defined as the time interval (18 to 24 months) between the end of previous pregnancy and the beginning of next pregnancy. The impact of short pregnancy interval on the outcome of subsequent pregnancy is uncertain. Some studies have shown that preterm delivery, and fetal growth restriction rates are increased in pregnancies with short interpregnancy interval.  However, it is not supported by other reports.   Rh Negative Mother Patient's blood type is A negative.  She has cell free fetal DNA screening (panorama) that showed the fetus to be Rh negative. I counseled the patient that studies have shown that cell free fetal DNA screening detects 100% of Rh-positive fetuses.  If the fetus is Rh negative, RhoGAM may not be given during pregnancy.  PAI inhibitors Given that she had a term pregnancy, presence of these inhibitors should not adversely affect outcomes.  I do not recommend prophylactic anticoagulation.  History of vaping Patient reports she quit smoking when pregnancy was discovered.  I counseled patient that we do not have information the effect of vaping in pregnancy and, therefore, it should be avoided.  Cigarette smoking is associated with adverse outcomes including fetal growth restriction and placental abruption.  Nicotine  replacement therapy is an option if patient Rezolsta cigarette smoking.  Recommendations -Fetal growth assessment  with evaluation of placenta at 28 weeks at your office.   Consultation including face-to-face (more than 50%) counseling 60 minutes.
